# Patient Record
Sex: Male | Born: 1940 | Race: White | Hispanic: No | Marital: Married | Smoking: Former smoker
Health system: Southern US, Community
[De-identification: ages and names within clinical notes are randomized; demographics above are authoritative.]

## PROBLEM LIST (undated history)

## (undated) DIAGNOSIS — R531 Weakness: Secondary | ICD-10-CM

## (undated) DIAGNOSIS — H409 Unspecified glaucoma: Secondary | ICD-10-CM

## (undated) DIAGNOSIS — I10 Essential (primary) hypertension: Secondary | ICD-10-CM

## (undated) DIAGNOSIS — Z9981 Dependence on supplemental oxygen: Secondary | ICD-10-CM

## (undated) DIAGNOSIS — R2681 Unsteadiness on feet: Secondary | ICD-10-CM

## (undated) DIAGNOSIS — E079 Disorder of thyroid, unspecified: Secondary | ICD-10-CM

## (undated) DIAGNOSIS — E7800 Pure hypercholesterolemia, unspecified: Secondary | ICD-10-CM

## (undated) DIAGNOSIS — J449 Chronic obstructive pulmonary disease, unspecified: Secondary | ICD-10-CM

## (undated) DIAGNOSIS — I2510 Atherosclerotic heart disease of native coronary artery without angina pectoris: Secondary | ICD-10-CM

## (undated) HISTORY — DX: Weakness: R53.1

## (undated) HISTORY — DX: Unsteadiness on feet: R26.81

## (undated) HISTORY — PX: CATARACT EXTRACTION: SUR2

## (undated) HISTORY — DX: Essential (primary) hypertension: I10

## (undated) HISTORY — DX: Chronic obstructive pulmonary disease, unspecified: J44.9

## (undated) HISTORY — DX: Dependence on supplemental oxygen: Z99.81

---
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
## 1968-08-17 HISTORY — PX: VASECTOMY: SHX75

## 1975-08-18 HISTORY — PX: WISDOM TOOTH EXTRACTION: SHX21

## 1980-08-17 HISTORY — PX: BACK SURGERY: SHX140

## 2000-07-07 ENCOUNTER — Ambulatory Visit (HOSPITAL_COMMUNITY): Admission: RE | Admit: 2000-07-07 | Discharge: 2000-07-07 | Payer: Self-pay | Admitting: Endocrinology

## 2004-09-10 ENCOUNTER — Encounter: Admission: RE | Admit: 2004-09-10 | Discharge: 2004-09-10 | Payer: Self-pay | Admitting: Internal Medicine

## 2004-11-05 ENCOUNTER — Ambulatory Visit (HOSPITAL_COMMUNITY): Admission: RE | Admit: 2004-11-05 | Discharge: 2004-11-05 | Payer: Self-pay | Admitting: Gastroenterology

## 2005-02-24 ENCOUNTER — Encounter: Admission: RE | Admit: 2005-02-24 | Discharge: 2005-02-24 | Payer: Self-pay | Admitting: Otolaryngology

## 2006-06-18 ENCOUNTER — Encounter: Admission: RE | Admit: 2006-06-18 | Discharge: 2006-06-18 | Payer: Self-pay | Admitting: Interventional Cardiology

## 2006-06-22 ENCOUNTER — Inpatient Hospital Stay (HOSPITAL_BASED_OUTPATIENT_CLINIC_OR_DEPARTMENT_OTHER): Admission: RE | Admit: 2006-06-22 | Discharge: 2006-06-22 | Payer: Self-pay | Admitting: Interventional Cardiology

## 2006-06-24 ENCOUNTER — Inpatient Hospital Stay (HOSPITAL_COMMUNITY): Admission: RE | Admit: 2006-06-24 | Discharge: 2006-06-25 | Payer: Self-pay | Admitting: Interventional Cardiology

## 2006-07-06 ENCOUNTER — Encounter (HOSPITAL_COMMUNITY): Admission: RE | Admit: 2006-07-06 | Discharge: 2006-10-04 | Payer: Self-pay | Admitting: Interventional Cardiology

## 2006-10-05 ENCOUNTER — Encounter (HOSPITAL_COMMUNITY): Admission: RE | Admit: 2006-10-05 | Discharge: 2006-10-08 | Payer: Self-pay | Admitting: Interventional Cardiology

## 2007-07-20 ENCOUNTER — Inpatient Hospital Stay (HOSPITAL_BASED_OUTPATIENT_CLINIC_OR_DEPARTMENT_OTHER): Admission: RE | Admit: 2007-07-20 | Discharge: 2007-07-20 | Payer: Self-pay | Admitting: Interventional Cardiology

## 2007-07-28 ENCOUNTER — Encounter: Payer: Self-pay | Admitting: Pulmonary Disease

## 2007-07-28 ENCOUNTER — Ambulatory Visit: Admission: RE | Admit: 2007-07-28 | Discharge: 2007-07-28 | Payer: Self-pay | Admitting: Interventional Cardiology

## 2007-08-18 HISTORY — PX: CORONARY ANGIOPLASTY WITH STENT PLACEMENT: SHX49

## 2007-11-29 ENCOUNTER — Encounter: Admission: RE | Admit: 2007-11-29 | Discharge: 2007-11-29 | Payer: Self-pay | Admitting: Family Medicine

## 2009-08-02 ENCOUNTER — Ambulatory Visit: Payer: Self-pay | Admitting: Ophthalmology

## 2009-08-07 ENCOUNTER — Ambulatory Visit: Payer: Self-pay | Admitting: Ophthalmology

## 2009-10-07 ENCOUNTER — Encounter: Payer: Self-pay | Admitting: Emergency Medicine

## 2009-10-07 ENCOUNTER — Inpatient Hospital Stay (HOSPITAL_COMMUNITY): Admission: EM | Admit: 2009-10-07 | Discharge: 2009-10-09 | Payer: Self-pay | Admitting: Internal Medicine

## 2009-10-07 ENCOUNTER — Ambulatory Visit: Payer: Self-pay | Admitting: Diagnostic Radiology

## 2009-10-25 ENCOUNTER — Encounter: Payer: Self-pay | Admitting: Emergency Medicine

## 2009-10-25 ENCOUNTER — Ambulatory Visit: Payer: Self-pay | Admitting: Diagnostic Radiology

## 2009-10-25 ENCOUNTER — Inpatient Hospital Stay (HOSPITAL_COMMUNITY): Admission: AD | Admit: 2009-10-25 | Discharge: 2009-11-08 | Payer: Self-pay | Admitting: Pulmonary Disease

## 2009-10-25 ENCOUNTER — Ambulatory Visit: Payer: Self-pay | Admitting: Pulmonary Disease

## 2009-10-31 ENCOUNTER — Ambulatory Visit: Payer: Self-pay | Admitting: Physical Medicine & Rehabilitation

## 2009-11-27 DIAGNOSIS — J439 Emphysema, unspecified: Secondary | ICD-10-CM

## 2009-11-27 DIAGNOSIS — J9620 Acute and chronic respiratory failure, unspecified whether with hypoxia or hypercapnia: Secondary | ICD-10-CM | POA: Insufficient documentation

## 2009-12-27 ENCOUNTER — Ambulatory Visit: Payer: Self-pay | Admitting: Pulmonary Disease

## 2009-12-27 DIAGNOSIS — J438 Other emphysema: Secondary | ICD-10-CM | POA: Insufficient documentation

## 2009-12-27 DIAGNOSIS — I10 Essential (primary) hypertension: Secondary | ICD-10-CM

## 2009-12-27 DIAGNOSIS — J9610 Chronic respiratory failure, unspecified whether with hypoxia or hypercapnia: Secondary | ICD-10-CM

## 2010-01-24 ENCOUNTER — Ambulatory Visit: Payer: Self-pay | Admitting: Pulmonary Disease

## 2010-01-27 ENCOUNTER — Telehealth (INDEPENDENT_AMBULATORY_CARE_PROVIDER_SITE_OTHER): Payer: Self-pay | Admitting: *Deleted

## 2010-01-28 ENCOUNTER — Telehealth: Payer: Self-pay | Admitting: Pulmonary Disease

## 2010-03-19 ENCOUNTER — Encounter (HOSPITAL_COMMUNITY): Admission: RE | Admit: 2010-03-19 | Discharge: 2010-05-16 | Payer: Self-pay | Admitting: Pulmonary Disease

## 2010-04-24 ENCOUNTER — Ambulatory Visit: Payer: Self-pay | Admitting: Pulmonary Disease

## 2010-05-17 ENCOUNTER — Encounter (HOSPITAL_COMMUNITY): Admission: RE | Admit: 2010-05-17 | Discharge: 2010-07-06 | Payer: Self-pay | Admitting: Pulmonary Disease

## 2010-07-23 ENCOUNTER — Ambulatory Visit: Payer: Self-pay | Admitting: Pulmonary Disease

## 2010-09-16 NOTE — Assessment & Plan Note (Signed)
Summary: rov for emphysema   Copy to:  Brendan Herring Primary Provider/Referring Provider:  Dorthey Herring  CC:  3 month follow up, Pt states breathing is better from last visit, Pt states the SOB is better, Denies any cough, and Pt states he has had some hoarseness but he states it's due to not rinsing well after using inhaler.  History of Present Illness: the pt comes in today for f/u of his known emphysema with chronic respiratory failure.  He has been participating in pulmonary rehab and doing well.  He is staying on his bronchodilator regimen, and denies any cough or congestion.  He has not had any worsening edema.  Preventive Screening-Counseling & Management  Alcohol-Tobacco     Smoking Status: quit     Packs/Day: 1.0     Year Started: 1956     Year Quit: 1996  Current Medications (verified): 1)  Albuterol Sulfate (2.5 Mg/23ml) 0.083% Nebu (Albuterol Sulfate) .[name].. 1 Vial in Nebulizer Four Times A Day As Needed 2)  Aspir-Low 81 Mg Tbec (Aspirin) .... Once Daily 3)  Cardizem 120 Mg Tabs (Diltiazem Hcl) .... Take 1 Tablet By Mouth Once A Day 4)  Senokot S 8.6-50 Mg Tabs (Sennosides-Docusate Sodium) .... Take 1 Tablet By Mouth Once A Day As Needed 5)  Ensure  Liqd (Nutritional Supplements) .[name].. 1 Can Q6h 6)  Xalatan 0.005 % Soln (Latanoprost) .[name].. 1 Drop Into Affected Eye Once Daily 7)  Synthroid 112 Mcg Tabs (Levothyroxine Sodium) .... Once Daily 8)  Oxygen .... 2l Cont 9)  Fish Oil .... Take 1 Tablet By Mouth Once A Day 10)  Calcium Carbonate-Vitamin D 600-400 Mg-Unit Tabs (Calcium Carbonate-Vitamin D) .... Take 1 Tablet By Mouth Once A Day 11)  Proair Hfa 108 (90 Base) Mcg/act  Aers (Albuterol Sulfate) .[name].. 1-2 Puffs Every 4-6 Hours As Needed 12)  Multivitamins  Tabs (Multiple Vitamin) .... Take 1 Tablet By Mouth Once A Day 13)  Spiriva Handihaler 18 Mcg  Caps (Tiotropium Bromide Monohydrate) .... One Puff  in Handihaler Daily 14)  Symbicort 160-4.5 Mcg/act  Aero  (Budesonide-Formoterol Fumarate) .... Two Puffs Twice Daily 15)  Lisinopril 20 Mg Tabs (Lisinopril) .[name].. 1 Tab Two Times A Day 16)  Terinafine 250 Mg .[name].. 1 Tab For 7 Days Once A Month  Allergies (verified): 1)  Lotensin 2)  Levaquin  Social History: Packs/Day:  1.0  Review of Systems       The patient complains of shortness of breath with activity, chest pain, weight change, and nasal congestion/difficulty breathing through nose.  The patient denies shortness of breath at rest, productive cough, non-productive cough, coughing up blood, irregular heartbeats, acid heartburn, indigestion, loss of appetite, abdominal pain, difficulty swallowing, sore throat, tooth/dental problems, headaches, sneezing, itching, ear ache, anxiety, depression, hand/feet swelling, joint stiffness or pain, rash, change in color of mucus, and fever.    Vital Signs:  Patient profile:   70 year old male Height:      73 inches Weight:      133 pounds O2 Sat:      98 % on 2 L/min Temp:     98.3 degrees F oral Pulse rate:   92 / minute BP sitting:   130 / 84  (right arm) Cuff size:   regular  Vitals Entered By: Carver Fila (April 24, 2010 1:39 PM)  O2 Flow:  2 L/min CC: 3 month follow up, Pt states breathing is better from last visit, Pt states the SOB  is better, Denies any cough, Pt states he has had some hoarseness but he states it's due to not rinsing well after using inhaler Is Patient Diabetic? No Comments meds and allergies updated Phone number updated Carver Fila  April 24, 2010 1:38 PM    Physical Exam  General:  thin male in nad Lungs:  decreased bs throughout, no wheezing Heart:  rrr, no mrg. distant hs. Extremities:  no significant edema, no cyanosis  Neurologic:  alert and oriented, moves all 4.   Impression & Recommendations:  Problem # 1:  EMPHYSEMA (ICD-492.8) the pt is doing about as well can be expected.  He is maintaining his exertional tolerance, has had no recent acute  exacerbations or pulmonary infections, and is improving his endurance in pulmonary rehab.  I have asked him to continue on his current meds, and will give him the flu shot today.  Medications Added to Medication List This Visit: 1)  Synthroid 112 Mcg Tabs (Levothyroxine sodium) .... Once daily 2)  Lisinopril 20 Mg Tabs (Lisinopril) .[name].. 1 tab two times a day 3)  Terinafine 250 Mg  .[name].. 1 tab for 7 days once a month  Other Orders: Est. Patient Level III (16109) Flu Vaccine 44yrs + MEDICARE PATIENTS (U0454) Administration Flu vaccine - MCR (U9811)  Patient Instructions: 1)  no change in meds. 2)  continue with pulmonary rehab 3)  will give the flu shot today 4)  followup with me in 3mos or sooner if problems.   Immunization History:  Influenza Immunization History:    Influenza:  historical (05/17/2009)  Pneumovax Immunization History:    Pneumovax:  historical (05/17/2009) Flu Vaccine Consent Questions     Do you have a history of severe allergic reactions to this vaccine? no    Any prior history of allergic reactions to egg and/or gelatin? no    Do you have a sensitivity to the preservative Thimersol? no    Do you have a past history of Guillan-Barre Syndrome? no    Do you currently have an acute febrile illness? no    Have you ever had a severe reaction to latex? no    Vaccine information given and explained to patient? yes    Are you currently pregnant? no    Lot Number:[id number]   Exp Date:02/14/2011   Site Given  R Deltoid. Aundra Millet Reynolds LPN  April 24, 2010 2:12 PM

## 2010-09-16 NOTE — Assessment & Plan Note (Signed)
Summary: consult for emphysema and chronic respiratory failure.   Copy to:  [name] Primary Provider/Referring Provider:  [name]  CC:  Pulmonary Consult.  History of Present Illness: the patient is a 70 year old male who I've been asked to see for management of emphysema. The patient carries a diagnosis of "COPD", and tells me that he has had pulmonary function studies at [facility] in 2005. These are currently not available. He was recently hospitalized at in March of this year with acute on chronic respiratory failure due to a COPD exacerbation. He required noninvasive positive pressure ventilation, but did not need to be on mechanical ventilation. He did spend some time in a skilled nursing facility to help with nutrition and severe debilitation. Currently, he is not on long acting bronchodilators, but rather nebulizer treatments 4 times a day. He is also still on chronic prednisone. He is also wearing oxygen, but did not have this prior to his recent hospitalization. He states that his breathing is much improved since being out of the hospital. He still gets winded walking through his house, but feels the physical therapy has helped. The patient has had breathing issues for greater than 10 years and states it has been stable the last one year. Prior to his hospitalization he was very functional, and spent 30 minutes a day on the treadmill. Cough and congestion has never been a big issue for him since he has quit smoking.  Preventive Screening-Counseling & Management  Alcohol-Tobacco     Smoking Status: quit  Current Medications (verified): 1)  Albuterol Sulfate (2.5 Mg/53ml) 0.083% Nebu (Albuterol Sulfate) .[name].. 1 Vial in Nebulizer Four Times A Day As Needed 2)  Aspir-Low 81 Mg Tbec (Aspirin) .... Once Daily 3)  Cardizem 120 Mg Tabs (Diltiazem Hcl) .... Take 1 Tablet By Mouth Once A Day 4)  Senokot S 8.6-50 Mg Tabs (Sennosides-Docusate Sodium) .... Take 1 Tablet By  Mouth Once A Day As Needed 5)  Mucinex 600 Mg Xr12h-Tab (Guaifenesin) .... Two Times A Day 6)  Ensure  Liqd (Nutritional Supplements) .[name].. 1 Can Q6h 7)  Xalatan 0.005 % Soln (Latanoprost) .[name].. 1 Drop Into Affected Eye Once Daily 8)  Synthroid 125 Mcg Tabs (Levothyroxine Sodium) .... Once Daily 9)  Oxygen .... 2l Cont 10)  Metoprolol Succinate 25 Mg Xr24h-Tab (Metoprolol Succinate) .... Take 1 Tablet By Mouth Once A Day 11)  Mirtazapine 30 Mg Tabs (Mirtazapine) .... Take 1 Tablet By Mouth Once A Day 12)  Fish Oil .... Take 1 Tablet By Mouth Once A Day 13)  Calcium Carbonate-Vitamin D 600-400 Mg-Unit Tabs (Calcium Carbonate-Vitamin D) .... Take 1 Tablet By Mouth Once A Day 14)  Prednisone 10 Mg Tabs (Prednisone) .... Take 1 Tablet By Mouth Once A Day 15)  Proair Hfa 108 (90 Base) Mcg/act  Aers (Albuterol Sulfate) .[name].. 1-2 Puffs Every 4-6 Hours As Needed 16)  Multivitamins  Tabs (Multiple Vitamin) .... Take 1 Tablet By Mouth Once A Day  Allergies (verified): 1)  Lotensin 2)  Levaquin  Past History:  Past Medical History: emphysema HYPERTENSION (ICD-401.9)  Past Surgical History: back surgery 1982 stent 2006 B cataract surgery 1990s tonsillectomy 1944 wisdom teeth extracted 1960s vasectomy 1970s  Family History: Reviewed history and no changes required. emphysema: father heart disease: father (CHF), brother (CHF) clotting disorders: father (stroke) brother ( fibrosis)  Social History: Reviewed history and no changes required. Patient states former smoker.   started at age 39.  1 ppd.  quit 1996  pt is married. pt has children. 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  The patient denies shortness of breath at rest, productive cough, non-productive cough, coughing up blood, chest pain, irregular heartbeats, acid  heartburn, indigestion, loss of appetite, abdominal pain, difficulty swallowing, sore throat, tooth/dental problems, headaches, nasal congestion/difficulty breathing through nose, sneezing, itching, ear ache, anxiety, depression, joint stiffness or pain, rash, change in color of mucus, and fever.    Vital Signs:  Patient profile:   70 year old male Height:      73 inches Weight:      134 pounds BMI:     17.74 O2 Sat:      95 % on 2 LPM cont Temp:     98.1 degrees F oral Pulse rate:   92 / minute BP sitting:   146 / 82  (left arm) Cuff size:   regular  Vitals Entered By: Arman Filter LPN (Dec 27, 2009 11:06 AM)  O2 Flow:  2 LPM cont CC: Pulmonary Consult Comments Medications reviewed with patient Arman Filter LPN  Dec 27, 2009 11:32 AM    Physical Exam  General:  thin male in nad Eyes:  PERRLA and EOMI.   Nose:  mild septal deviation, but patent Mouth:  clear  Neck:  no jvd, tmg, LN Lungs:  very decreased bs throughout, no wheezing Heart:  distant but regular, no mrg Abdomen:  soft and nontender, bs+ Extremities:  2+ edema in feet, no cyanosis pulses intact distally Neurologic:  alert and oriented, moves all 4.   Impression & Recommendations:  Problem # 1:  EMPHYSEMA (ICD-492.8) the patient has fairly severe emphysema clinically, but we'll try and get his old PFTs for verification. At this point, I would like to get him on a long acting bronchodilator regimen which has been shown superior two short acting medications. I also think that he would greatly benefit from a pulmonary rehabilitation program once he is stronger. I have spent a lot of time with him discussing nutrition, and the importance of taking in large numbers of calories daily. His work of breathing is leading to muscle breakdown for calories, and this in turn leads to worsening dyspnea. Weight loss is considered to be a very worrisome finding in those with emphysema, and usually indicates the patient has  reached end-stage disease. Will also try and get the patient off chronic prednisone.  Medications Added to Medication List This Visit: 1)  Albuterol Sulfate (2.5 Mg/30ml) 0.083% Nebu (Albuterol sulfate) .[name].. 1 vial in nebulizer four times a day as needed 2)  Cardizem 120 Mg Tabs (Diltiazem hcl) .... Take 1 tablet by mouth once a day 3)  Senokot S 8.6-50 Mg Tabs (Sennosides-docusate sodium) .... Take 1 tablet by mouth once a day as needed 4)  Xalatan 0.005 % Soln (Latanoprost) .[name].. 1 drop into affected eye once daily 5)  Metoprolol Succinate 25 Mg Xr24h-tab (Metoprolol succinate) .... Take 1 tablet by mouth once a day 6)  Mirtazapine 30 Mg Tabs (Mirtazapine) .... Take 1 tablet by mouth once a day 7)  Fish Oil  .... Take 1 tablet by mouth once a day 8)  Calcium Carbonate-vitamin D 600-400 Mg-unit Tabs (Calcium carbonate-vitamin d) .... Take 1 tablet by mouth once a day 9)  Prednisone 10 Mg Tabs (Prednisone) .... Take 1 tablet by  mouth once a day 10)  Proair Hfa 108 (90 Base) Mcg/act Aers (Albuterol sulfate) .[name].. 1-2 puffs every 4-6 hours as needed 11)  Multivitamins Tabs (Multiple vitamin) .... Take 1 tablet by mouth once a day 12)  Spiriva Handihaler 18 Mcg Caps (Tiotropium bromide monohydrate) .... One puff  in handihaler daily 13)  Symbicort 160-4.5 Mcg/act Aero (Budesonide-formoterol fumarate) .... Two puffs twice daily  Other Orders: Consultation Level IV (04540) Prescription Created Electronically [phone])  Patient Instructions: 1)  restart symbicort 160/4.5  2 inhalations am and pm...rinse mouth well 2)  restart spiriva one inhalation each am 3)  can use albuterol nebs or inhaler for rescue only and "bad days" 4)  push calories, but ok with me to stop appetite stimulant 5)  decrease prednisone to 5mg  each day for 2 weeks, then stop 6)  will refer to pulmonary rehab at [facility] once you are a little stronger. 7)  let me know what type of portable oxygen you decide on, and will order for  you. 8)  use mucinex as needed for thick mucus. 9)  followup with me in 4 weeks.  Prescriptions: SYMBICORT 160-4.5 MCG/ACT  AERO (BUDESONIDE-FORMOTEROL FUMARATE) Two puffs twice daily  #1 x 6   Entered and Authorized by:   Barbaraann Share MD   Signed by:   Barbaraann Share MD on 12/27/2009   Method used:   Electronically to        [facility] [address] [id number]* (retail)       [address]       [place], [place]  [address]       Ph: [phone]       Fax: [phone]   RxID:   [id number] SPIRIVA HANDIHALER 18 MCG  CAPS (TIOTROPIUM BROMIDE MONOHYDRATE) one puff  in handihaler daily  #30 x 6   Entered and Authorized by:   Barbaraann Share MD   Signed by:   Barbaraann Share MD on 12/27/2009   Method used:   Electronically to        [facility] [address] #[id number]* (retail)       [address]       [place], [place]  [address]       Ph: [phone]       Fax: [phone]   RxID:   [id number]   Appended Document: consult for emphysema and chronic respiratory failure. megan, did we ever get pfts on this pt from [facility] from 2005?  Appended Document: consult for emphysema and chronic respiratory failure. spoke with Marcelino Duster at [facility] and was informed she looked through the 2005 and 2006 boxes of PFT and never saw a PFT on this pt.    Appended Document: consult for emphysema and chronic respiratory failure. noted.  these will have to be done.

## 2010-09-16 NOTE — Progress Notes (Signed)
Summary: proair prescript  Phone Note Call from Patient   Caller: Patient Call For: clance Summary of Call: need prescript for proair [facility] [address]   Initial call taken by: Rickard Patience,  January 27, 2010 2:38 PM  Follow-up for Phone Call        rx sent to [facility] [address].  called spoke with patient, informed him of pending rx.  pt verbalized his understanding. Follow-up by: Boone Master CNA/MA,  January 27, 2010 2:50 PM    Prescriptions: PROAIR HFA 108 (90 BASE) MCG/ACT  AERS (ALBUTEROL SULFATE) 1-2 puffs every 4-6 hours as needed  #30days x 6   Entered by:   Boone Master CNA/MA   Authorized by:   Barbaraann Share MD   Signed by:   Boone Master CNA/MA on 01/27/2010   Method used:   Electronically to        [facility] [address] [id number]* (retail)       [address]       [place], [place]  [address]       Ph: [phone]       Fax: [phone]   RxID:   [id number]

## 2010-09-16 NOTE — Progress Notes (Signed)
Summary: portable O2  Phone Note Call from Patient Call back at Home Phone [phone]   Caller: Patient Call For: Jovani Colquhoun Summary of Call: pt says [facility] won't provide portable O2 concentrator for him. pt uses [facility] for something else and wants to know if an order for the portable O2 can be sent to [facility] for this.  Initial call taken by: Tivis Ringer, CNA,  January 28, 2010 10:46 AM  Follow-up for Phone Call        Spoke with pt.  He states that he spoke with [facility] today and was told that since he only had one insurance policy, they can not provide him with portable o2.  He wants to know if we can just switch him back to [facility] for portable o2.  [facility] please help thanks Vernie Murders  January 28, 2010 10:56 AM On 01/24/10 pt saw Dr. Shelle Iron and wanted to change from [facility] to [facility] for his oxygen. Order sent to [facility] to D/C o2 and Order faxed to [facility] to provide o2. [facility] has p/u their equipment and I have left a message for the site manager at [facility] to return my call. I'm not sure if [facility] is willing to accept pt back, since he was not happy with their service. Rhonda Cobb  January 28, 2010 11:58 AM  Spoke with Okey Regal and she has already spoke to the patient. She stated that she is checking with her manager to see if they will make an expection and provide device for pt. She stated she will contact pt and advise. [facility] is willing to accept pt back. D/C order will be sent to [facility] once [facility] has their equipment back in there. Alfonso Ramus  January 28, 2010 12:43 PM Called and spoke with pt and he is aware and will call me once [facility]'s equipment is back in his home, so I can fax a D/C order to [facility]. Rhonda Cobb  January 28, 2010 12:44 PM

## 2010-09-16 NOTE — Assessment & Plan Note (Signed)
Summary: Brendan Herring for emphysema   Copy to:  Brendan Herring Primary Provider/Referring Provider:  Dorthey Herring  CC:  Pt is here for a 4 week f/u appt on his emphysema.  Pt states breathing is unchange from last visit. Pt states he finished prednisone.  Pt states he hasnn't needed to use nebulizer but has used rescue hfa " a few times" d/ the heat.  [name]  History of Present Illness: The pt comes in today for f/u of his severe emphysema with chronic respiratory failure.  We have been able to locate his old pfts, and verified he does indeed have severe emphysema.  The pt has not seen a big difference since last visit where he was changed over to a long acting bronchodilator regimen.  He has finished up his prednisone taper prescribed at discharge from the hospital when he had his acute exacerbation.  He denies any significant chest congestion, cough, or purulence.  Allergies (verified): 1)  Lotensin 2)  Levaquin  Review of Systems       The patient complains of shortness of breath with activity, nasal congestion/difficulty breathing through nose, and hand/feet swelling.  The patient denies shortness of breath at rest, productive cough, non-productive cough, coughing up blood, chest pain, irregular heartbeats, acid heartburn, indigestion, loss of appetite, weight change, abdominal pain, difficulty swallowing, sore throat, tooth/dental problems, headaches, sneezing, itching, ear ache, anxiety, depression, joint stiffness or pain, rash, change in color of mucus, and fever.    Vital Signs:  Patient profile:   70 year old male Height:      73 inches Weight:      133.31 pounds O2 Sat:      84 % on Room air Temp:     98.0 degrees F oral Pulse rate:   96 / minute BP sitting:   142 / 84  (left arm) Cuff size:   regular  Vitals Entered By: Arman Filter LPN (January 24, 2010 1:35 PM)  O2 Flow:  Room air CC: Pt is here for a 4 week f/u appt on his emphysema.  Pt states breathing is unchange from  last visit. Pt states he finished prednisone.  Pt states he hasnn't needed to use nebulizer but has used rescue hfa " a few times" d/ the heat.   Comments started pt on oxygen at 2 LPM.  o2 sats increased to 96% on 2 LPM.   Medications reviewed with patient Arman Filter LPN  January 24, 2010 1:35 PM    Physical Exam  General:  thin male in nad Lungs:  very decreased bs throughout, no wheezing or rhonchi Heart:  rrr, distant Extremities:  no edema or cyanosis Neurologic:  alert and oriented, moves all 4.   Impression & Recommendations:  Problem # 1:  EMPHYSEMA (ICD-492.8) the pt has severe emphysema with chronic respiratory failure.  He is on an excellent bronchodilator regimen, and now is oxygen dependent.  How much he is able to maintain his QOL is going to depend on his participation in a conditioning program, and also his ability to improve his nutritional status.  I will refer him to a pulmonary rehab program, and also stressed the need to increase protein calorie intake.    Problem # 2:  CHRONIC RESPIRATORY FAILURE (ICD-518.83) the pt wishes a more portable oxygen source, and has requested a portable concentrator.  Will refer to dme for this.  Other Orders: Pulse Oximetry, Ambulatory (21308) Rehabilitation Referral (Rehab) DME Referral (DME) Est. Patient Level III (  [address])  Patient Instructions: 1)  will get you referred to [facility] for the portable concentrator 2)  will refer to pulmonary rehab. 3)  no change in meds. 4)  followup with me in 3mos.

## 2010-09-18 NOTE — Assessment & Plan Note (Signed)
Summary: rov for severe emphysema   Visit Type:  Follow-up Copy to:  [name] Primary Provider/Referring Provider:  [name]  CC:  3 month follow up. pt states he has his "good" and "bad" days. pt denies any cough. Pt wants to be talked through on what he needs to do when he gets sick. pt states he quit smoking in 1996.  [name]  History of Present Illness: the pt comes in today for f/u of his known severe emphysema with chronic respiratory failure.  He is staying on his bronchodilator regimen and oxygen, and feels his exertional tolerance is near his usual baseline.  He denies any significant cough or congestion, and has not had a recent acute exacerbation.  Current Medications (verified): 1)  Albuterol Sulfate (2.5 Mg/3ml) 0.083% Nebu (Albuterol Sulfate) .[name].. 1 Vial in Nebulizer Four Times A Day As Needed 2)  Aspir-Low 81 Mg Tbec (Aspirin) .... Once Daily 3)  Cardizem 120 Mg Tabs (Diltiazem Hcl) .... Take 1 Tablet By Mouth Once A Day 4)  Senokot S 8.6-50 Mg Tabs (Sennosides-Docusate Sodium) .... Take 1 Tablet By Mouth Once A Day As Needed 5)  Ensure  Liqd (Nutritional Supplements) .[name].. 1 Can Q6h 6)  Xalatan 0.005 % Soln (Latanoprost) .[name].. 1 Drop Into Affected Eye Once Daily 7)  Synthroid 100 Mcg Tabs (Levothyroxine Sodium) .... Once Daily 8)  Oxygen .... 2l Cont 9)  Fish Oil 1000 Mg Caps (Omega-3 Fatty Acids) .... 2 Once Daily 10)  Calcium Carbonate-Vitamin D 600-400 Mg-Unit Tabs (Calcium Carbonate-Vitamin D) .... Take 1 Tablet By Mouth Once A Day 11)  Proair Hfa 108 (90 Base) Mcg/act  Aers (Albuterol Sulfate) .[name].. 1-2 Puffs Every 4-6 Hours As Needed 12)  Multivitamins  Tabs (Multiple Vitamin) .... Take 1 Tablet By Mouth Once A Day 13)  Spiriva Handihaler 18 Mcg  Caps (Tiotropium Bromide Monohydrate) .... One Puff  in Handihaler Daily 14)  Symbicort 160-4.5 Mcg/act  Aero (Budesonide-Formoterol Fumarate) .... Two Puffs Twice Daily 15)  Lisinopril 20 Mg Tabs (Lisinopril) .[name]..  1 Tab Two Times A Day 16)  Terinafine 250 Mg .[name].. 1 Tab For 7 Days Once A Month  Allergies (verified): 1)  Lotensin 2)  Levaquin  Review of Systems       The patient complains of shortness of breath with activity, weight change, and nasal congestion/difficulty breathing through nose.  The patient denies shortness of breath at rest, productive cough, non-productive cough, coughing up blood, chest pain, irregular heartbeats, acid heartburn, indigestion, loss of appetite, abdominal pain, difficulty swallowing, sore throat, tooth/dental problems, headaches, sneezing, itching, ear ache, anxiety, depression, hand/feet swelling, joint stiffness or pain, rash, change in color of mucus, and fever.    Vital Signs:  Patient profile:   70 year old male Height:      73 inches Weight:      139.50 pounds BMI:     18.47 O2 Sat:      96 % on 2 L/min Temp:     98.3 degrees F oral Pulse rate:   90 / minute BP sitting:   138 / 76  (left arm) Cuff size:   regular  Vitals Entered By: Carver Fila (July 23, 2010 1:22 PM)  O2 Flow:  2 L/min CC: 3 month follow up. pt states he has his "good" and "bad" days. pt denies any cough. Pt wants to be talked through on what he needs to do when he gets sick. pt states he quit smoking in  1996.   Comments meds and allergies updated Phone number updated Carver Fila  July 23, 2010 1:23 PM    Physical Exam  General:  thin male in nad Lungs:  very decreased bs throughout, no active wheezing noted. Heart:  rrr, no mrg Extremities:  minimal edema, no cyanosis  Neurologic:  alert and oriented, moves all 4.   Impression & Recommendations:  Problem # 1:  EMPHYSEMA (ICD-492.8) the pt appears to be at a stable baseline, although it is not a satisfactory one to him.  He appears to be doing as well as can be expected on his current bronchodilator regimen and oxygen.  I have had a discussion with him again about the "red flags" that may indicate when he is getting  into trouble vs the normal variation in symptoms based on airtrapping/airway tone on a given day.  I have continued to encourage him to stay as active as possible.  Medications Added to Medication List This Visit: 1)  Synthroid 100 Mcg Tabs (Levothyroxine sodium) .... Once daily 2)  Fish Oil 1000 Mg Caps (Omega-3 fatty acids) .... 2 once daily  Other Orders: Est. Patient Level III (62130)  Patient Instructions: 1)  no change in meds 2)  continue to stay as active as possible 3)  followup with me in 3mos.

## 2010-10-22 ENCOUNTER — Ambulatory Visit (INDEPENDENT_AMBULATORY_CARE_PROVIDER_SITE_OTHER): Payer: Medicare Other | Admitting: Pulmonary Disease

## 2010-10-22 ENCOUNTER — Encounter: Payer: Self-pay | Admitting: Pulmonary Disease

## 2010-10-22 DIAGNOSIS — J438 Other emphysema: Secondary | ICD-10-CM

## 2010-10-22 DIAGNOSIS — J9610 Chronic respiratory failure, unspecified whether with hypoxia or hypercapnia: Secondary | ICD-10-CM

## 2010-11-04 NOTE — Assessment & Plan Note (Signed)
Summary: rov for emphysema   Copy to:  [name] Primary Provider/Referring Provider:  [name]  CC:  3 month f/u appt for emphysema.  Pt states breathing is unchanged from last visit- still has "good days and bad days."   Pt states he currently has skin cancer on nose, ear, and and temple.  [name]  History of Present Illness: the pt comes in today for f/u of his known emphysema.  He is maintaining his usual baseline, and has not had a recent acute exacerbation.  He denies any chest congestion or purulence.  He is maintaining on his usual meds and supplemental oxygen.    Current Medications (verified): 1)  Albuterol Sulfate (2.5 Mg/57ml) 0.083% Nebu (Albuterol Sulfate) .[name].. 1 Vial in Nebulizer Four Times A Day As Needed 2)  Aspir-Low 81 Mg Tbec (Aspirin) .... Once Daily 3)  Cardizem 120 Mg Tabs (Diltiazem Hcl) .... Take 1 Tablet By Mouth Once A Day 4)  Senokot S 8.6-50 Mg Tabs (Sennosides-Docusate Sodium) .... Take 1 Tablet By Mouth Two Times A Day 5)  Ensure  Liqd (Nutritional Supplements) .[name].. 1 Can Q6h 6)  Xalatan 0.005 % Soln (Latanoprost) .[name].. 1 Drop in Each Eye Once Daily 7)  L Thyroxine .... Take 1 Tablet By Mouth Once A Day 8)  Oxygen .... 2l Cont 9)  Fish Oil 1000 Mg Caps (Omega-3 Fatty Acids) .... 2 Once Daily 10)  Calcium Carbonate-Vitamin D 600-400 Mg-Unit Tabs (Calcium Carbonate-Vitamin D) .... Take 1 Tablet By Mouth Two Times A Day 11)  Proair Hfa 108 (90 Base) Mcg/act  Aers (Albuterol Sulfate) .[name].. 1-2 Puffs Every 4-6 Hours As Needed 12)  Multivitamins  Tabs (Multiple Vitamin) .... Take 1 Tablet By Mouth Once A Day 13)  Spiriva Handihaler 18 Mcg  Caps (Tiotropium Bromide Monohydrate) .... One Puff  in Handihaler Daily 14)  Symbicort 160-4.5 Mcg/act  Aero (Budesonide-Formoterol Fumarate) .... Two Puffs Twice Daily 15)  Lisinopril 20 Mg Tabs (Lisinopril) .... Take 1 To 2 Tabs By Mouth Daily 16)  Terinafine 250 Mg .[name].. 1 Tab For 7 Days Once A Month 17)   Dorzolamide Hcl 2 % Soln (Dorzolamide Hcl) .[name].. 1 Drop in Each Eye 2 To 3 Times A Day 18)  Fluorouracil 5 % Crea (Fluorouracil) .... Apply As Directed 5 Nighs Per Week  Allergies (verified): 1)  Lotensin 2)  Levaquin  Review of Systems       The patient complains of shortness of breath with activity and nasal congestion/difficulty breathing through nose.  The patient denies shortness of breath at rest, productive cough, non-productive cough, coughing up blood, chest pain, irregular heartbeats, acid heartburn, indigestion, loss of appetite, weight change, abdominal pain, difficulty swallowing, sore throat, tooth/dental problems, headaches, sneezing, itching, ear ache, anxiety, depression, hand/feet swelling, joint stiffness or pain, rash, change in color of mucus, and fever.    Vital Signs:  Patient profile:   70 year old male Height:      73 inches Weight:      137.25 pounds BMI:     18.17 O2 Sat:      96 % on Room air Temp:     98.8 degrees F oral Pulse rate:   89 / minute BP sitting:   144 / 82  (left arm) Cuff size:   regular  Vitals Entered By: Arman Filter LPN (October 21, 1608 2:20 PM)  O2 Flow:  Room air CC: 3 month f/u appt for emphysema.  Pt states breathing  is unchanged from last visit- still has "good days and bad days."   Pt states he currently has skin cancer on nose, ear, and temple.   Comments Medications reviewed with patient Arman Filter LPN  October 22, 2839 2:20 PM    Physical Exam  General:  thin male in nad  Lungs:  decreased bs throughout, no wheezing Heart:  rrr, no mrg  Extremities:  no edema or cyanosis  Neurologic:  alert and oriented, moves all 4    Impression & Recommendations:  Problem # 1:  EMPHYSEMA (ICD-492.8) the pt has severe emphysema with chronic respiratory failure, and is maintaining at a stable baseline.  I have asked him to continue on his meds and oxygen, and to keep working on a conditioning program which is so key to maintaining  his functional status.  I have also asked him to keep up with his nutrition to maintain muscle mass.  Medications Added to Medication List This Visit: 1)  Senokot S 8.6-50 Mg Tabs (Sennosides-docusate sodium) .... Take 1 tablet by mouth two times a day 2)  Xalatan 0.005 % Soln (Latanoprost) .[name].. 1 drop in each eye once daily 3)  L Thyroxine  .... Take 1 tablet by mouth once a day 4)  Calcium Carbonate-vitamin D 600-400 Mg-unit Tabs (Calcium carbonate-vitamin d) .... Take 1 tablet by mouth two times a day 5)  Lisinopril 20 Mg Tabs (Lisinopril) .... Take 1 to 2 tabs by mouth daily 6)  Dorzolamide Hcl 2 % Soln (Dorzolamide hcl) .[name].. 1 drop in each eye 2 to 3 times a day 7)  Fluorouracil 5 % Crea (Fluorouracil) .... Apply as directed 5 nighs per week  Other Orders: Est. Patient Level III (32440)  Patient Instructions: 1)  no change in meds 2)  continue to stay active 3)  followup with me in 3mos

## 2010-11-05 LAB — COMPREHENSIVE METABOLIC PANEL
AST: 30 U/L (ref 0–37)
Albumin: 3 g/dL — ABNORMAL LOW (ref 3.5–5.2)
Alkaline Phosphatase: 47 U/L (ref 39–117)
Chloride: 94 mEq/L — ABNORMAL LOW (ref 96–112)
GFR calc Af Amer: >60 mL/min (ref >60)
Potassium: 3.3 mEq/L — ABNORMAL LOW (ref 3.5–5.1)
Total Bilirubin: 0.9 mg/dL (ref 0.3–1.2)
Total Protein: 5.9 g/dL — ABNORMAL LOW (ref 6.0–8.3)

## 2010-11-05 LAB — BASIC METABOLIC PANEL
BUN: 13 mg/dL (ref 6–23)
CO2: 30 mEq/L (ref 19–32)
GFR calc non Af Amer: >60 mL/min (ref >60)
Glucose, Bld: 227 mg/dL — ABNORMAL HIGH (ref 70–99)
Potassium: 3.7 mEq/L (ref 3.5–5.1)
Sodium: 135 mEq/L (ref 135–145)

## 2010-11-05 LAB — CBC
HCT: 38 % — ABNORMAL LOW (ref 39.0–52.0)
Hemoglobin: 13.3 g/dL (ref 13.0–17.0)
MCHC: 34.8 g/dL (ref 30.0–36.0)
MCV: 103.5 fL — ABNORMAL HIGH (ref 78.0–100.0)
Platelets: 157 10*3/uL (ref 150–400)
Platelets: 174 10*3/uL (ref 150–400)
RDW: 13.1 % (ref 11.5–15.5)
RDW: 13.1 % (ref 11.5–15.5)
WBC: 6.8 10*3/uL (ref 4.0–10.5)

## 2010-11-05 LAB — DIFFERENTIAL
Basophils Absolute: 0 10*3/uL (ref 0.0–0.1)
Basophils Absolute: 0 10*3/uL (ref 0.0–0.1)
Basophils Relative: 0 % (ref 0–1)
Basophils Relative: 0 % (ref 0–1)
Eosinophils Absolute: 0 10*3/uL (ref 0.0–0.7)
Eosinophils Relative: 0 % (ref 0–5)
Eosinophils Relative: 0 % (ref 0–5)
Lymphocytes Relative: 13 % (ref 12–46)
Lymphocytes Relative: 8 % — ABNORMAL LOW (ref 12–46)
Monocytes Absolute: 0.4 10*3/uL (ref 0.1–1.0)
Monocytes Absolute: 0.9 10*3/uL (ref 0.1–1.0)
Monocytes Relative: 13 % — ABNORMAL HIGH (ref 3–12)

## 2010-11-05 LAB — MAGNESIUM: Magnesium: 2 mg/dL (ref 1.5–2.5)

## 2010-11-07 LAB — CBC
HCT: 43.8 % (ref 39.0–52.0)
Platelets: 158 10*3/uL (ref 150–400)
RDW: 12.3 % (ref 11.5–15.5)
WBC: 7.4 10*3/uL (ref 4.0–10.5)

## 2010-11-07 LAB — POCT I-STAT 3, ART BLOOD GAS (G3+)
Bicarbonate: 28.7 mEq/L — ABNORMAL HIGH (ref 20.0–24.0)
Patient temperature: 37
pCO2 arterial: 40.8 mmHg (ref 35.0–45.0)
pH, Arterial: 7.455 — ABNORMAL HIGH (ref 7.350–7.450)

## 2010-11-07 LAB — BASIC METABOLIC PANEL
BUN: 14 mg/dL (ref 6–23)
Calcium: 8.5 mg/dL (ref 8.4–10.5)
Creatinine, Ser: 0.7 mg/dL (ref 0.4–1.5)
GFR calc non Af Amer: >60 mL/min (ref >60)
Glucose, Bld: 128 mg/dL — ABNORMAL HIGH (ref 70–99)
Potassium: 3.6 mEq/L (ref 3.5–5.1)

## 2010-11-07 LAB — DIFFERENTIAL
Basophils Absolute: 0 10*3/uL (ref 0.0–0.1)
Eosinophils Relative: 0 % (ref 0–5)
Lymphocytes Relative: 11 % — ABNORMAL LOW (ref 12–46)
Lymphs Abs: 0.8 10*3/uL (ref 0.7–4.0)
Neutrophils Relative %: 76 % (ref 43–77)

## 2010-11-09 LAB — CBC
HCT: 36.5 % — ABNORMAL LOW (ref 39.0–52.0)
HCT: 38.1 % — ABNORMAL LOW (ref 39.0–52.0)
HCT: 46.1 % (ref 39.0–52.0)
Hemoglobin: 12.3 g/dL — ABNORMAL LOW (ref 13.0–17.0)
MCV: 102.4 fL — ABNORMAL HIGH (ref 78.0–100.0)
MCV: 102.8 fL — ABNORMAL HIGH (ref 78.0–100.0)
MCV: 103.6 fL — ABNORMAL HIGH (ref 78.0–100.0)
Platelets: 182 10*3/uL (ref 150–400)
Platelets: 211 10*3/uL (ref 150–400)
Platelets: 224 10*3/uL (ref 150–400)
RBC: 3.58 MIL/uL — ABNORMAL LOW (ref 4.22–5.81)
RDW: 12.8 % (ref 11.5–15.5)
RDW: 13 % (ref 11.5–15.5)
RDW: 13 % (ref 11.5–15.5)
WBC: 11.2 10*3/uL — ABNORMAL HIGH (ref 4.0–10.5)
WBC: 12.1 10*3/uL — ABNORMAL HIGH (ref 4.0–10.5)
WBC: 3.8 10*3/uL — ABNORMAL LOW (ref 4.0–10.5)

## 2010-11-09 LAB — BASIC METABOLIC PANEL
BUN: 18 mg/dL (ref 6–23)
BUN: 19 mg/dL (ref 6–23)
BUN: 19 mg/dL (ref 6–23)
CO2: 37 mEq/L — ABNORMAL HIGH (ref 19–32)
CO2: 38 mEq/L — ABNORMAL HIGH (ref 19–32)
CO2: 40 mEq/L — ABNORMAL HIGH (ref 19–32)
Calcium: 8.1 mg/dL — ABNORMAL LOW (ref 8.4–10.5)
Calcium: 8.2 mg/dL — ABNORMAL LOW (ref 8.4–10.5)
Calcium: 8.4 mg/dL (ref 8.4–10.5)
Chloride: 100 mEq/L (ref 96–112)
Chloride: 91 mEq/L — ABNORMAL LOW (ref 96–112)
Chloride: 94 mEq/L — ABNORMAL LOW (ref 96–112)
Chloride: 97 mEq/L (ref 96–112)
Chloride: 97 mEq/L (ref 96–112)
Chloride: 98 mEq/L (ref 96–112)
Creatinine, Ser: 0.67 mg/dL (ref 0.4–1.5)
Creatinine, Ser: 0.7 mg/dL (ref 0.4–1.5)
Creatinine, Ser: 0.75 mg/dL (ref 0.4–1.5)
Creatinine, Ser: 0.77 mg/dL (ref 0.4–1.5)
Creatinine, Ser: 0.8 mg/dL (ref 0.4–1.5)
GFR calc Af Amer: >60 mL/min (ref >60)
GFR calc Af Amer: >60 mL/min (ref >60)
GFR calc Af Amer: >60 mL/min (ref >60)
GFR calc Af Amer: >60 mL/min (ref >60)
GFR calc non Af Amer: >60 mL/min (ref >60)
GFR calc non Af Amer: >60 mL/min (ref >60)
GFR calc non Af Amer: >60 mL/min (ref >60)
Glucose, Bld: 189 mg/dL — ABNORMAL HIGH (ref 70–99)
Glucose, Bld: 192 mg/dL — ABNORMAL HIGH (ref 70–99)
Potassium: 3.6 mEq/L (ref 3.5–5.1)
Potassium: 3.6 mEq/L (ref 3.5–5.1)
Potassium: 3.7 mEq/L (ref 3.5–5.1)
Sodium: 139 mEq/L (ref 135–145)
Sodium: 141 mEq/L (ref 135–145)

## 2010-11-09 LAB — GLUCOSE, CAPILLARY
Glucose-Capillary: 102 mg/dL — ABNORMAL HIGH (ref 70–99)
Glucose-Capillary: 102 mg/dL — ABNORMAL HIGH (ref 70–99)
Glucose-Capillary: 108 mg/dL — ABNORMAL HIGH (ref 70–99)
Glucose-Capillary: 110 mg/dL — ABNORMAL HIGH (ref 70–99)
Glucose-Capillary: 122 mg/dL — ABNORMAL HIGH (ref 70–99)
Glucose-Capillary: 123 mg/dL — ABNORMAL HIGH (ref 70–99)
Glucose-Capillary: 132 mg/dL — ABNORMAL HIGH (ref 70–99)
Glucose-Capillary: 147 mg/dL — ABNORMAL HIGH (ref 70–99)
Glucose-Capillary: 147 mg/dL — ABNORMAL HIGH (ref 70–99)
Glucose-Capillary: 152 mg/dL — ABNORMAL HIGH (ref 70–99)
Glucose-Capillary: 152 mg/dL — ABNORMAL HIGH (ref 70–99)
Glucose-Capillary: 155 mg/dL — ABNORMAL HIGH (ref 70–99)
Glucose-Capillary: 162 mg/dL — ABNORMAL HIGH (ref 70–99)
Glucose-Capillary: 163 mg/dL — ABNORMAL HIGH (ref 70–99)
Glucose-Capillary: 166 mg/dL — ABNORMAL HIGH (ref 70–99)
Glucose-Capillary: 178 mg/dL — ABNORMAL HIGH (ref 70–99)
Glucose-Capillary: 180 mg/dL — ABNORMAL HIGH (ref 70–99)
Glucose-Capillary: 188 mg/dL — ABNORMAL HIGH (ref 70–99)
Glucose-Capillary: 193 mg/dL — ABNORMAL HIGH (ref 70–99)
Glucose-Capillary: 200 mg/dL — ABNORMAL HIGH (ref 70–99)
Glucose-Capillary: 209 mg/dL — ABNORMAL HIGH (ref 70–99)
Glucose-Capillary: 218 mg/dL — ABNORMAL HIGH (ref 70–99)
Glucose-Capillary: 231 mg/dL — ABNORMAL HIGH (ref 70–99)
Glucose-Capillary: 241 mg/dL — ABNORMAL HIGH (ref 70–99)
Glucose-Capillary: 248 mg/dL — ABNORMAL HIGH (ref 70–99)
Glucose-Capillary: 255 mg/dL — ABNORMAL HIGH (ref 70–99)
Glucose-Capillary: 277 mg/dL — ABNORMAL HIGH (ref 70–99)
Glucose-Capillary: 282 mg/dL — ABNORMAL HIGH (ref 70–99)
Glucose-Capillary: 291 mg/dL — ABNORMAL HIGH (ref 70–99)
Glucose-Capillary: 302 mg/dL — ABNORMAL HIGH (ref 70–99)
Glucose-Capillary: 311 mg/dL — ABNORMAL HIGH (ref 70–99)
Glucose-Capillary: 347 mg/dL — ABNORMAL HIGH (ref 70–99)
Glucose-Capillary: 361 mg/dL — ABNORMAL HIGH (ref 70–99)
Glucose-Capillary: 382 mg/dL — ABNORMAL HIGH (ref 70–99)
Glucose-Capillary: 61 mg/dL — ABNORMAL LOW (ref 70–99)
Glucose-Capillary: 91 mg/dL (ref 70–99)
Glucose-Capillary: 92 mg/dL (ref 70–99)

## 2010-11-09 LAB — CARDIAC PANEL(CRET KIN+CKTOT+MB+TROPI)
CK, MB: 5.2 ng/mL — ABNORMAL HIGH (ref 0.3–4.0)
Relative Index: INVALID (ref 0.0–2.5)
Relative Index: INVALID (ref 0.0–2.5)
Relative Index: INVALID (ref 0.0–2.5)
Total CK: 34 U/L (ref 7–232)
Total CK: 50 U/L (ref 7–232)
Troponin I: <0.01 ng/mL (ref 0.00–0.06)

## 2010-11-09 LAB — MRSA PCR SCREENING
MRSA by PCR: NEGATIVE
MRSA by PCR: NEGATIVE
MRSA by PCR: NEGATIVE

## 2010-11-09 LAB — URINE CULTURE
Colony Count: 55000
Colony Count: NO GROWTH
Special Requests: NEGATIVE

## 2010-11-09 LAB — CULTURE, BLOOD (ROUTINE X 2)
Culture: NO GROWTH
Culture: NO GROWTH

## 2010-11-09 LAB — URINALYSIS, ROUTINE W REFLEX MICROSCOPIC
Bilirubin Urine: NEGATIVE
Glucose, UA: 250 mg/dL — AB
Hgb urine dipstick: NEGATIVE
Specific Gravity, Urine: 1.02 (ref 1.005–1.030)
Urobilinogen, UA: 0.2 mg/dL (ref 0.0–1.0)

## 2010-11-09 LAB — MAGNESIUM
Magnesium: 1.8 mg/dL (ref 1.5–2.5)
Magnesium: 2.3 mg/dL (ref 1.5–2.5)

## 2010-11-09 LAB — PROTIME-INR
INR: 0.96 (ref 0.00–1.49)
Prothrombin Time: 12.7 seconds (ref 11.6–15.2)

## 2010-11-09 LAB — POCT I-STAT 3, ART BLOOD GAS (G3+)
Bicarbonate: 32.6 mEq/L — ABNORMAL HIGH (ref 20.0–24.0)
pCO2 arterial: 43.2 mmHg (ref 35.0–45.0)
pCO2 arterial: 52 mmHg — ABNORMAL HIGH (ref 35.0–45.0)
pO2, Arterial: 154 mmHg — ABNORMAL HIGH (ref 80.0–100.0)
pO2, Arterial: 75 mmHg — ABNORMAL LOW (ref 80.0–100.0)

## 2010-11-09 LAB — BLOOD GAS, ARTERIAL
Bicarbonate: 28.8 mEq/L — ABNORMAL HIGH (ref 20.0–24.0)
FIO2: 0.3 %
O2 Saturation: 96.7 %
Patient temperature: 98.4
TCO2: 30.4 mmol/L (ref 0–100)

## 2010-11-09 LAB — PHOSPHORUS
Phosphorus: 2.5 mg/dL (ref 2.3–4.6)
Phosphorus: 2.6 mg/dL (ref 2.3–4.6)
Phosphorus: 2.6 mg/dL (ref 2.3–4.6)

## 2010-11-09 LAB — LACTIC ACID, PLASMA: Lactic Acid, Venous: 1.1 mmol/L (ref 0.5–2.2)

## 2010-11-09 LAB — LEGIONELLA ANTIGEN, URINE

## 2010-11-09 LAB — APTT: aPTT: 30 seconds (ref 24–37)

## 2010-11-09 LAB — HEPATIC FUNCTION PANEL
ALT: 31 U/L (ref 0–53)
Bilirubin, Direct: <0.1 mg/dL (ref 0.0–0.3)
Total Protein: 4.9 g/dL — ABNORMAL LOW (ref 6.0–8.3)

## 2010-11-09 LAB — T4: T4, Total: 10.5 ug/dL (ref 5.0–12.5)

## 2010-11-09 LAB — TSH: TSH: 1.822 u[IU]/mL (ref 0.350–4.500)

## 2010-11-09 LAB — BRAIN NATRIURETIC PEPTIDE: Pro B Natriuretic peptide (BNP): 35 pg/mL (ref 0.0–100.0)

## 2010-11-10 LAB — POCT I-STAT 3, ART BLOOD GAS (G3+)
pCO2 arterial: 63.6 mmHg (ref 35.0–45.0)
pH, Arterial: 7.327 — ABNORMAL LOW (ref 7.350–7.450)

## 2010-11-10 LAB — URINALYSIS, ROUTINE W REFLEX MICROSCOPIC
Hgb urine dipstick: NEGATIVE
Nitrite: NEGATIVE
Specific Gravity, Urine: 1.019 (ref 1.005–1.030)
Urobilinogen, UA: 0.2 mg/dL (ref 0.0–1.0)

## 2010-11-10 LAB — BASIC METABOLIC PANEL
CO2: 36 mEq/L — ABNORMAL HIGH (ref 19–32)
Calcium: 8.6 mg/dL (ref 8.4–10.5)
Creatinine, Ser: 0.8 mg/dL (ref 0.4–1.5)
GFR calc Af Amer: >60 mL/min (ref >60)

## 2010-11-10 LAB — DIFFERENTIAL
Basophils Absolute: 0 10*3/uL (ref 0.0–0.1)
Basophils Relative: 1 % (ref 0–1)
Monocytes Relative: 14 % — ABNORMAL HIGH (ref 3–12)
Neutro Abs: 6.1 10*3/uL (ref 1.7–7.7)
Neutrophils Relative %: 79 % — ABNORMAL HIGH (ref 43–77)

## 2010-11-10 LAB — CBC
MCHC: 34.7 g/dL (ref 30.0–36.0)
RBC: 4.69 MIL/uL (ref 4.22–5.81)

## 2010-11-10 LAB — POCT CARDIAC MARKERS: Myoglobin, poc: 43.4 ng/mL (ref 12–200)

## 2010-12-30 NOTE — Cardiovascular Report (Signed)
NAME:  Brendan Herring, Brendan Herring                ACCOUNT NO.:  [id number]   MEDICAL RECORD NO.:  [id number]          PATIENT TYPE:  OIB   LOCATION:  1965                         FACILITY:  [facility]   PHYSICIAN:  [name], MDDATE OF BIRTH:  24-Jul-1941   DATE OF PROCEDURE:  07/20/2007  DATE OF DISCHARGE:  07/20/2007                            CARDIAC CATHETERIZATION   REFERRING PHYSICIAN:  C. Duane Lope, M.D.   REASON FOR PROCEDURE:  Coronary artery disease and shortness of breath.   PROCEDURES PERFORMED:  1. Left heart catheterization.  2. Left ventriculogram.  3. Coronary angiogram.  4. Abdominal aortogram.   OPERATOR:  [name], MD   PROCEDURE NARRATIVE:  The risks and benefits of cardiac catheterization  were explained to the patient, and informed consent was obtained. The  patient was brought to the catheterization lab. He was prepped and  draped in the usual sterile fashion.  His right groin was infiltrated  with 1% lidocaine.  A 4-French arterial sheath was placed into the right  femoral artery using the modified Seldinger technique.  Left coronary  artery angiography was performed using a JL-4.5 catheter.  The catheter  was advanced to the ostium of the left circumflex.  The LAD and  circumflex had separate ostia and was then used to engage the LAD.  Angiography was performed in multiple projections using hand injection  of contrast.  Right coronary artery angiography was then performed using  a 3DRC catheter.  The catheter was advanced to the vessel ostium under  fluoroscopic guidance.  Digital angiography was performed in multiple  projections using hand injection of contrast.  A pigtail catheter was  advanced into the left ventricle under fluoroscopic guidance.  Power  injection of contrast was performed in the RAO projection. The catheter  was pulled back under continuous hemodynamic pressure monitoring. Power  injection of contrast was performed in the abdominal  aorta to image at  the level of the renal arteries.  The sheath was removed using manual  compression.   FINDINGS:  The LAD and circumflex had separate ostia.   The circumflex was a large vessel proximally.  There were mild luminal  irregularities.  There was a patent OM-1, a small OM-2. The mid  circumflex appeared occluded just before the OM-3.  There were left-to-  left collaterals filling the distal OM-3.   The left anterior descending was a large vessel.  There were mild  luminal irregularities proximally.  The mid LAD stent was widely patent.  There was a small diagonal-1 which was patent.  There was a small  diagonal-2 at the site of the stent which was also widely patent.   The right coronary artery was a large dominant vessel.  The vessel was  widely patent.  There were mild luminal irregularities throughout.   The abdominal aorta did show a small infrarenal abdominal aortic  aneurysm. There was no significant renal artery stenosis.   HEMODYNAMIC RESULTS:  Left ventricular pressure 97/2 and an LVEDP of 5  mmHg.  Aortic pressure of 91/52 with a mean aortic pressure of 70 mmHg.  Left ventriculogram reveals normal left ventricular function with an  estimated ejection fraction of 55%.   IMPRESSION:  1. Widely patent left anterior descending stent at diagonal site where      intervention was performed last year.  2. 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  3. Small abdominal aortic aneurysm.  4. Normal hemodynamics  5. Separate ostia of left circumflex and left anterior descending.  6. Normal left ventricular function.   RECOMMENDATIONS:  1. The patient will have pulmonary function test to further evaluate      shortness of breath.  If no abnormalities are found, I would      consider trying to open his chronically occluded OM; however, I am      not sure this would make a big difference.  I think it is more       likely that he has another cause of the shortness of breath.  2. Continue aggressive medical therapy including aspirin, Plavix,      lipid-lowering therapy.      [name], MD  Electronically Signed     JSV/MEDQ  D:  08/22/2007  T:  08/22/2007  Job:  [id number]

## 2011-01-02 NOTE — Discharge Summary (Signed)
NAME:  Brendan Herring, Brendan Herring NO.:  [id number]   MEDICAL RECORD NO.:  [id number]          PATIENT TYPE:  INP   LOCATION:  6533                         FACILITY:  [facility]   PHYSICIAN:  [name], MDDATE OF BIRTH:  03-06-1941   DATE OF ADMISSION:  06/24/2006  DATE OF DISCHARGE:  06/25/2006                               DISCHARGE SUMMARY   ADMISSION DIAGNOSIS:  Shortness of breath and an abnormal stress test.   DISCHARGE DIAGNOSES:  1. Shortness of breath, resolved.  2. Abnormal Cardiolite status post cardiac catheterization with 80%      mid left anterior descending artery stenosis at the origin of the      second diagonal  3. Status post percutaneous coronary intervention with successful      Taxus stent implantation to the mid left anterior descending artery      and percutaneous transluminal coronary angioplasty to the diagonal      2 with eventual simultaneous kissing balloons in the mid left      anterior descending  artery, June 24, 2006.  4. Chronic obstructive pulmonary disease.  5. Emphysema.  6. Hypertension.  7. Hypothyroidism.  8. Osteopenia.   PROCEDURES:  Percutaneous coronary intervention of the left anterior  descending artery and second diagonal June 24, 2006.   HOSPITAL COURSE:  Mr. Astorga is a 70 year old male who was admitted  secondary to cardiac catheterization on June 22, 2006 with 80% mid  LAD stenosis at the origin of the second diagonal.  He was admitted on  June 24, 2006 for PCI of the mid LAD and diagonal 2.  A Taxus stent  was successfully placed in the mid LAD and PTCA to the diagonal 2 and  the mid LAD.  There was an excellent angiographic result with TIMI grade  III flow.  There was 0% residual stenosis in the LAD artery and a 10%  residual stenosis in the ostium of the second diagonal.  The procedure  was well tolerated by the patient with minimal estimated blood loss.  The patient will be continued on aspirin 325  mg daily and Plavix 75 mg  daily, indefinitely.  He was kept overnight for observation and was  hydrated aggressively post procedure.  Lipid-lowering therapy will be  continued with Pravachol.  The patient is already on an ACE inhibitor  which will also be continued.  The sheath was removed with manual  compression, and Angiomax was used for anticoagulation.  The patient  remained asymptomatic during this admission.  His groin was stable  without evidence of bleeding, oozing, or hematoma.  He was discharged to  home in stable condition on June 25, 2006 after ambulating in the  hallways with cardiac rehab without symptoms of chest pain, shortness of  breath, nausea, vomiting, diaphoresis, or dizziness.   LABORATORY DATA:  White blood count 7.4, hemoglobin 13.2, hematocrit  37.8, platelets 211.  Sodium 139, potassium 3.8, chloride 106, CO2 29,  glucose 96, BUN 13, creatinine 0.8, calcium 8.4.   X-RAYS:  Outpatient x-rays were obtained.  However, none were obtained  during this admission.  EKG:  EKG June 24, 2006:  Normal sinus rhythm with a ventricular rate  of 66 beats per minute with nonspecific ST/T-wave abnormalities.  No  evidence of ischemia.   CONDITION ON DISCHARGE:  Mr. Copeman was discharged to home today in  stable condition without symptoms of chest pain or shortness of breath.  His groin was stable without oozing, bleeding, hematoma, or  pseudoaneurysm.   DISCHARGE MEDICATIONS:  1. Aspirin 325 mg daily.  This represents an increase in the dosage of      the aspirin.  2. Plavix 75 mg daily.  3. Zestoretic 20/12.5 mg daily.  4. Covera ER 240 mg daily to be restarted on Sunday, November 11,      20 07.  5. Synthroid 125 mcg daily.  6. Spiriva.  7. Multivitamin 1 tablet daily.  8. Calcium as before admission.  9. Pravachol 20 mg daily.   DISCHARGE INSTRUCTIONS:  1. Maintain a heart-healthy diet including low-salt, low-fat, and low      cholesterol.  2. No  lifting greater than 10 pounds for 1 week.  3. No driving for 2 days.  4. Bathe groin area with warm soap and water.  Do not scrub area.  5. Call physician office with recurrent chest pain or shortness of      breath.   FOLLOW-UP ARRANGEMENTS:  A follow-up appointment with Dr. Everette Rank  has been scheduled for July 07, 2006 at 1:45 p.m.  The patient is  scheduled to call [phone] if he is unable to make the scheduled  appointment.      [address] [place], [place]      [name], MD  Electronically Signed    RDM/MEDQ  D:  06/25/2006  T:  06/26/2006  Job:  [id number]   cc:   Melida Quitter, M.D.  [name], MD

## 2011-01-02 NOTE — H&P (Signed)
NAME:  Brendan Herring, Brendan Herring NO.:  [id number]   MEDICAL RECORD NO.:  [id number]          PATIENT TYPE:  OIB   LOCATION:  1965                         FACILITY:  [facility]   PHYSICIAN:  [name], MDDATE OF BIRTH:  1941/05/15   DATE OF ADMISSION:  06/22/2006  DATE OF DISCHARGE:  06/22/2006                              HISTORY & PHYSICAL   REASON FOR TODAY'S EVALUATION:  Worsening shortness of breath, left  shoulder discomfort, positive Cardiolite.   HISTORY OF PRESENT ILLNESS:  Brendan Herring is a 70 year old male patient  with a history of COPD.  He does have some long-term dyspnea, but states  that over the past 6 months he feels like his shortness of breath on  exertion has worsened.  He also had developed a left shoulder discomfort  that is associated with the shortness of breath.  He was seen in the  office on May 18, 2006 and ultimately a Cardiolite was performed  that showed a moderate-sized fixed defect in the inferolateral  territory.  There was also reversibility in the inferior and lateral  areas of the heart surrounding this fixed defect.  The EF was 65%.  The  patient's stress test was a treadmill stress test.  He was unable to  complete the test because he could not get to his target heart rate.  He  did have ST segment depression in the inferior leads.   Because of these findings, he was brought in today for cardiac  catheterization.   PAST MEDICAL HISTORY:  1. Emphysema.  2. Hypertension.  3. Hypothyroidism.  4. Osteopenia.   PAST SURGICAL HISTORY:  1. Tonsillectomy with tooth extraction, 1967.  2. Lower back surgery, 1982.  3. Detached retina, 2007.  4. Cataract surgery, 1995 and 2000.   ALLERGIES:  LEVAQUIN AND LOTENSIN.   CURRENT MEDICATIONS:  1. Lisinopril/hydrochlorothiazide 50/25 mg 1 tablet daily.  2. Diltiazem CD 240 mg a day.  3. Baby aspirin daily.  4. Synthroid 125 mcg daily.  5. Spiriva inhaler.  6. Calcium 1200 mg a  day.   SOCIAL HISTORY:  Former smokers with 35-pack-year history.  Occasional  alcohol use.  No illicit drug use.  He is retired.   FAMILY HISTORY:  No family history of early coronary artery disease.   REVIEW OF SYSTEMS:  No recent fevers, chills, or flu-like symptoms.  Denies any nausea, vomiting, or diarrhea.  All other symptoms are as  above or are negative.   PHYSICAL EXAMINATION:  VITAL SIGNS:  Blood pressure 130/80, pulse 80.  HEENT:  Grossly normal.  Sclerae clear, conjunctivae normal.  NECK:  No carotid or supraclavicular bruits.  No JVD or thyromegaly.  HEART:  Regular rate and rhythm.  Normal S1, S2.  LUNGS:  Clear to auscultation bilaterally.  No wheezes or rhonchi.  ABDOMEN:  Benign.  Nontender, nondistended.  EXTREMITIES:  No peripheral edema.  Palpable posterior tibialis pulses.  NEUROLOGIC:  Normal gait.  SKIN:  Warm and dry.  PSYCH:  Normal mood and affect.   ASSESSMENT/PLAN:  1. Dyspnea on exertion.  2. Left shoulder discomfort associated  with #1.  3. Abnormal Cardiolite.  4. Chronic obstructive pulmonary disease.  5. Former smoker.  6. Hypertension.  7. Hypothyroidism.  8. Osteopenia.  9. Long-term medication usage.   The patient is being brought in today for cardiac catheterization under  the care of Dr. Everette Rank.  The risks and benefits of the procedure  have been discussed with the patient including heart attack, stroke, or  even death.  The patient agrees to proceed.      Guy Franco, P.A.      [name], MD  Electronically Signed    LB/MEDQ  D:  06/22/2006  T:  06/22/2006  Job:  [id number]   cc:   Haig Prophet

## 2011-01-02 NOTE — Cardiovascular Report (Signed)
NAME:  Brendan Herring, Brendan Herring NO.:  [id number]   MEDICAL RECORD NO.:  [id number]          PATIENT TYPE:  OIB   LOCATION:  1965                         FACILITY:  [facility]   PHYSICIAN:  [name], MDDATE OF BIRTH:  [birth year]   DATE OF PROCEDURE:  06/22/2006  DATE OF DISCHARGE:                            CARDIAC CATHETERIZATION   REFERRING PHYSICIAN:  Duane Lope, M.D. [facility].   INDICATIONS FOR CATHETERIZATION:  Abnormal stress test and exertional  shortness of breath.   OPERATOR:  [name], MD.   PROCEDURE NARRATIVE:  The risks and benefits of cardiac catheterization  were explained to the patient, and informed consent was obtained. The  patient was brought to the catheterization lab and prepped, draped in  the usual sterile fashion.  Right groin was infiltrated 1% lidocaine.  A  4-French arterial sheath was placed into the right femoral artery using  modified Seldinger technique. Left coronary artery angiography was  performed using a JL-4.0 catheter. The catheter was selected and the  circumflex artery.  This catheter was then removed, and a JL-3.5  catheter was used to engage the ostium of the LAD. Digital angiography  was performed in multiple projections using hand injection of contrast.  Right coronary artery angiography was then performed using JR-4.0  catheter.  The catheter was advanced to the vessel ostium under  fluoroscopic guidance.  Digital angiography was performed in multiple  projections using hand injection of contrast. The ventriculogram was  then performed. A pigtail catheter was advanced to the aortic valve and  into the left ventricle.  The 30-degree RAO projection was used for  power injection of contrast. The catheter was pulled back under  continuous hemodynamic pressure monitoring. The catheter was withdrawn  to the level of the renal arteries.  Power injection of contrast was  performed in the AP  projection to visualize the infrarenal abdominal  aorta.  The sheath was pulled using manual compression.   FINDINGS:  There are separate ostia of the LAD and circumflex. There is  no left main.   The LAD has a 70% lesion in the mid vessel at the second diagonal. There  is a small first diagonal which appears free of disease.  There is a  medium-size vessel.   The circumflex is a medium-sized vessel and is patent proximally.  There  is a large OM-1 which has luminal irregularities. Just after the OM-2,  the circumflex is occluded. The OM-3 fills by left-to-left and right-to-  left collaterals.   The right coronary artery is a large dominant vessel with mild luminal  irregularities.   HEMODYNAMIC RESULTS:  Left ventricular pressure 122/6, LVEDP of 10 mmHg.  Aortic pressure 118/62 with a mean aortic pressure of 85 mmHg.  There is  also a small abdominal aortic aneurysm.  There are single renal arteries  perfusing each kidney.  There is an ostial 25% lesion in the right renal  artery. The left renal artery is widely patent   IMPRESSION:  1. Two-vessel coronary artery disease including the mid circumflex and  obtuse marginal #3 as well as the mid left anterior descending at      the origin of the second diagonal.  2. Overall normal ventricular function with an estimated ejection      fraction of 55%.  There is mild inferobasal hypokinesis.   PLAN:  1. We discussed various options and will proceed with PCI of the LAD      on Friday of this week.  We will give the patient 300 mg of Plavix      prior to the intervention.  2. Continue aspirin as well. The patient will also benefit from lipid-      lowering therapy and continued use of ACE inhibitor.      [name], MD  Electronically Signed     JSV/MEDQ  D:  06/22/2006  T:  06/22/2006  Job:  [id number]

## 2011-01-02 NOTE — Op Note (Signed)
NAME:  Brendan Herring, Brendan Herring NO.:  [id number]   MEDICAL RECORD NO.:  [id number]          PATIENT TYPE:  AMB   LOCATION:  ENDO                         FACILITY:  [facility]   PHYSICIAN:  Danise Edge, M.D.   DATE OF BIRTH:  [birth year]   DATE OF PROCEDURE:  11/05/2004  DATE OF DISCHARGE:                                 OPERATIVE REPORT   PROCEDURE:  Screening colonoscopy.   PROCEDURE INDICATIONS:  Brendan Herring is a 70 year old male born Dec 24, 1940.  Brendan Herring is scheduled to undergo his first screening colonoscopy  with polypectomy to prevent colon cancer.   ENDOSCOPIST:  Danise Edge, M.D.   PREMEDICATION:  1.  Versed 7 mg.  2.  Demerol 50 mg.   PROCEDURE:  After obtaining informed consent, Brendan Herring was placed in the  left lateral decubitus position.  I administered intravenous Demerol and  intravenous Versed to achieve conscious sedation for the procedure.  The  patient's blood pressure, oxygen saturation, and cardiac rhythm were  monitored throughout the procedure and documented in the medical record.   Anal inspection and digital rectal exam were normal.  The prostate was non-  nodular. Olympus adjustable pediatric colonoscope was introduced into the  rectum and advanced to the cecum.  Colonic preparation for the exam today  was excellent.   Rectum normal.   Sigmoid colon and descending colon.  Colonic diverticulosis.   Splenic flexure normal.   Transverse colon normal.   Hepatic flexure normal.   Ascending colon normal.   Cecum and ileocecal valve normal.   ASSESSMENT:  Normal screening proctocolonoscopy to the cecum.  Diverticulosis of the left colon noted.      MJ/MEDQ  D:  11/05/2004  T:  11/05/2004  Job:  [id number]   cc:   Sharlet Salina, M.D.  [address]  [place]  [place] [address]  Fax: [phone]

## 2011-01-02 NOTE — Cardiovascular Report (Signed)
NAME:  Brendan Herring, Brendan Herring NO.:  [id number]   MEDICAL RECORD NO.:  [id number]          PATIENT TYPE:  INP   LOCATION:  6533                         FACILITY:  [facility]   PHYSICIAN:  [name], MDDATE OF BIRTH:  1941-01-09   DATE OF PROCEDURE:  06/24/2006  DATE OF DISCHARGE:                            CARDIAC CATHETERIZATION   REFERRING PHYSICIAN:  Dr. Leonides Sake.   PROCEDURE PERFORMED:  PCI of the LAD and second diagonal.   OPERATOR:  Dr. Eldridge Dace.   INDICATIONS:  Shortness of breath and abnormal stress test.   FINDINGS:  There is an 80% mid-LAD stenosis at the origin of the second  diagonal.  There was great difficulty engaging the ostium of the LAD  because there was no left main and the circumflex and LAD had separate  ostia.  Initially a XB3 guiding catheter was tried days.  A Voda 3  guide, JL-3.5 and JL-3.0 guiding catheters were all attempted.  Finally  an XB/LAD guiding catheter was successful in engaging the ostium of the  LAD.  A BMW wire first was inserted into the second diagonal.  A  Prowater was placed down the LAD.  A 2.5 x 16 mm Taxus stent was then  deployed across the lesion in the mid-LAD.  It was deployed at 12  atmospheres for 43 seconds.  There was a 90% residual stenosis noted in  the ostium of the second diagonal with normal flow.  A whisper wire was  then placed down the second diagonal.  A 2.0 x 10 FireStar balloon was  inflated to 10 atmospheres for 40 seconds.  A 2.0 x 10 mm FireStar  balloon was then advanced into the LAD stent.  Simultaneous kissing  balloons were then performed with both balloons being inflated to 8  atmospheres for 30 seconds.  There was an excellent angiographic result  with TIMI III flow down both vessels.   IMPRESSION:  1. Successful Taxus stent placement to the mid-left anterior      descending artery with a 2.5 x 16 mm Taxus stent.  The diameter of      these stent after deployment was probably  about 2.75 mm.  2. Percutaneous transluminal coronary angioplasty to the second      diagonal with a 2.0 x 10 FireStar balloon.  Simultaneous kissing      balloons were then performed in the left anterior descending artery      and second diagonal with an excellent angiographic result.  There      was TIMI III flow.  There was a 0% residual stenosis in the left      anterior descending artery and a 10% residual stenosis in the      ostium of the second diagonal.   RECOMMENDATIONS:  The patient can continue on aspirin 325 mg daily and  Plavix 75 mg daily indefinitely.  The patient will be hydrated  aggressively postprocedure.  Should also continue secondary prevention  including lipid  lowering therapy.  Will consider beta blockade; however, the patient  does have COPD but also consider an ACE  inhibitor which he is already  on.  I will watch the patient overnight.  The sheath will be removed  using manual compression.  Angiomax was used for anticoagulation.      [name], MD  Electronically Signed     JSV/MEDQ  D:  06/24/2006  T:  06/25/2006  Job:  [id number]

## 2011-01-05 ENCOUNTER — Other Ambulatory Visit: Payer: Self-pay | Admitting: Pulmonary Disease

## 2011-01-07 ENCOUNTER — Telehealth: Payer: Self-pay | Admitting: Pulmonary Disease

## 2011-01-07 NOTE — Telephone Encounter (Signed)
Rx sent to pharmacy. Pt informed.  

## 2011-01-09 ENCOUNTER — Encounter: Payer: Self-pay | Admitting: Pulmonary Disease

## 2011-01-21 ENCOUNTER — Ambulatory Visit (INDEPENDENT_AMBULATORY_CARE_PROVIDER_SITE_OTHER): Payer: Medicare Other | Admitting: Pulmonary Disease

## 2011-01-21 ENCOUNTER — Encounter: Payer: Self-pay | Admitting: Pulmonary Disease

## 2011-01-21 VITALS — BP 154/80 | HR 85 | Temp 97.8°F | Ht 73.0 in | Wt 136.2 lb

## 2011-01-21 DIAGNOSIS — J449 Chronic obstructive pulmonary disease, unspecified: Secondary | ICD-10-CM

## 2011-01-21 NOTE — Progress Notes (Signed)
  Subjective:    Patient ID: Brendan Herring, male    DOB: 07-30-1941, 70 y.o.   MRN: [id number]  HPI The pt comes in today for f/u of his known severe emphysema and chronic RF.  He is staying on his BD regimen, and wearing oxygen compliantly.  He is staying active, and feels that his doe is at his usual baseline.  He denies any significant cough or mucus production.  He has not had worsening LE edema.    Review of Systems  Constitutional: Negative for fever and unexpected weight change.  HENT: Positive for nosebleeds and congestion. Negative for ear pain, sore throat, rhinorrhea, sneezing, trouble swallowing, dental problem, postnasal drip and sinus pressure.   Eyes: Positive for redness and itching.  Respiratory: Positive for shortness of breath. Negative for cough, chest tightness and wheezing.   Cardiovascular: Negative for palpitations and leg swelling.  Gastrointestinal: Negative for nausea and vomiting.  Genitourinary: Positive for dysuria.  Musculoskeletal: Negative for joint swelling.  Skin: Positive for rash.  Neurological: Negative for headaches.  Hematological: Bruises/bleeds easily.  Psychiatric/Behavioral: Negative for dysphoric mood. The patient is not nervous/anxious.        Objective:   Physical Exam Thin male in nad Nares without discharge or purulence Chest with very decreased bs throughout, no wheezing or rhonchi Cor with distant hs, regular LE without edema, no cyanosis  Alert, oriented, moves all 4        Assessment & Plan:

## 2011-01-21 NOTE — Patient Instructions (Signed)
No change in meds. Stay as active as possible Will send an order to [facility] for your extended battery for portable concentrator Push calories as much as possible followup with me in 3mos

## 2011-01-21 NOTE — Assessment & Plan Note (Signed)
The pt has severe emphysema, and is really doing better than expected.  He has not had an acute exacerbation or pulmonary infection since the last visit, and is trying to stay active.  He is to stay on his oxygen and BD regimen, and to followup with me in 3mos.

## 2011-01-28 ENCOUNTER — Telehealth: Payer: Self-pay | Admitting: Pulmonary Disease

## 2011-01-28 MED ORDER — BUDESONIDE-FORMOTEROL FUMARATE 160-4.5 MCG/ACT IN AERO: 2.0000 | INHALATION_SPRAY | Freq: Two times a day (BID) | RESPIRATORY_TRACT | Status: DC

## 2011-01-28 NOTE — Telephone Encounter (Signed)
Called and spoke with pt.  Informed him rx sent to pharmacy.

## 2011-04-08 ENCOUNTER — Other Ambulatory Visit: Payer: Self-pay | Admitting: Pulmonary Disease

## 2011-04-28 ENCOUNTER — Ambulatory Visit (INDEPENDENT_AMBULATORY_CARE_PROVIDER_SITE_OTHER): Payer: Medicare Other | Admitting: Pulmonary Disease

## 2011-04-28 ENCOUNTER — Encounter: Payer: Self-pay | Admitting: Pulmonary Disease

## 2011-04-28 VITALS — BP 158/82 | HR 88 | Temp 98.1°F | Ht 73.0 in | Wt 134.6 lb

## 2011-04-28 DIAGNOSIS — J449 Chronic obstructive pulmonary disease, unspecified: Secondary | ICD-10-CM

## 2011-04-28 NOTE — Assessment & Plan Note (Signed)
The patient continues to do about as well as expected given the severity of his lung disease.  He has had no recent exacerbation or chest infection.  He is trying to stay as active as possible, and is wearing his oxygen the most part 24 hours a day.  I have offered him the flu shot today, however he prefers to get this with his primary care physician.  He will followup with me in 3-4 months, or sooner if having issues.

## 2011-04-28 NOTE — Patient Instructions (Signed)
No change in meds. Stay as active as you can Make sure you get your flu shot with primary md at upcoming visit.  followup with me in 3-80mos.

## 2011-04-28 NOTE — Progress Notes (Signed)
  Subjective:    Patient ID: Brendan Herring, male    DOB: 30-Jul-1941, 70 y.o.   MRN: [id number]  HPI Patient comes in today for followup of his known severe COPD with chronic respiratory failure.  He is doing well on his current medical regimen, and has not had a recent exacerbation her pulmonary infection.  He is staying very active, and feels that his breathing is at his usual baseline.  He did have a lot of issues this summer with the high heat and humidity, but is much improved with the cooler weather.   Review of Systems  Constitutional: Negative for fever and unexpected weight change.  HENT: Positive for ear pain, nosebleeds, congestion and sneezing. Negative for sore throat, rhinorrhea, trouble swallowing, dental problem, postnasal drip and sinus pressure.   Eyes: Negative for redness and itching.  Respiratory: Positive for shortness of breath. Negative for cough, chest tightness and wheezing.   Cardiovascular: Negative for palpitations and leg swelling.  Gastrointestinal: Negative for nausea and vomiting.  Genitourinary: Negative for dysuria.  Musculoskeletal: Negative for joint swelling.  Skin: Negative for rash.  Neurological: Negative for headaches.  Hematological: Does not bruise/bleed easily.  Psychiatric/Behavioral: Negative for dysphoric mood. The patient is not nervous/anxious.        Objective:   Physical Exam Thin male in no acute distress Nose without purulence or discharge noted Chest with very decreased breath sounds throughout, no wheezes or rhonchi Cardiac exam distant heart sounds but regular Lower extremities without edema, no cyanosis noted Alert and oriented, moves all 4 extremities.       Assessment & Plan:

## 2011-07-28 IMAGING — CR DG CHEST 1V PORT
2 series · 2 of 2 positions shown · non-contrast
Comparison: 10/25/2009.

CLINICAL DATA: Emphysema.  Hypertension.  Short of breath.

PORTABLE CHEST - 1 VIEW

[view not recorded (1 of 2)]
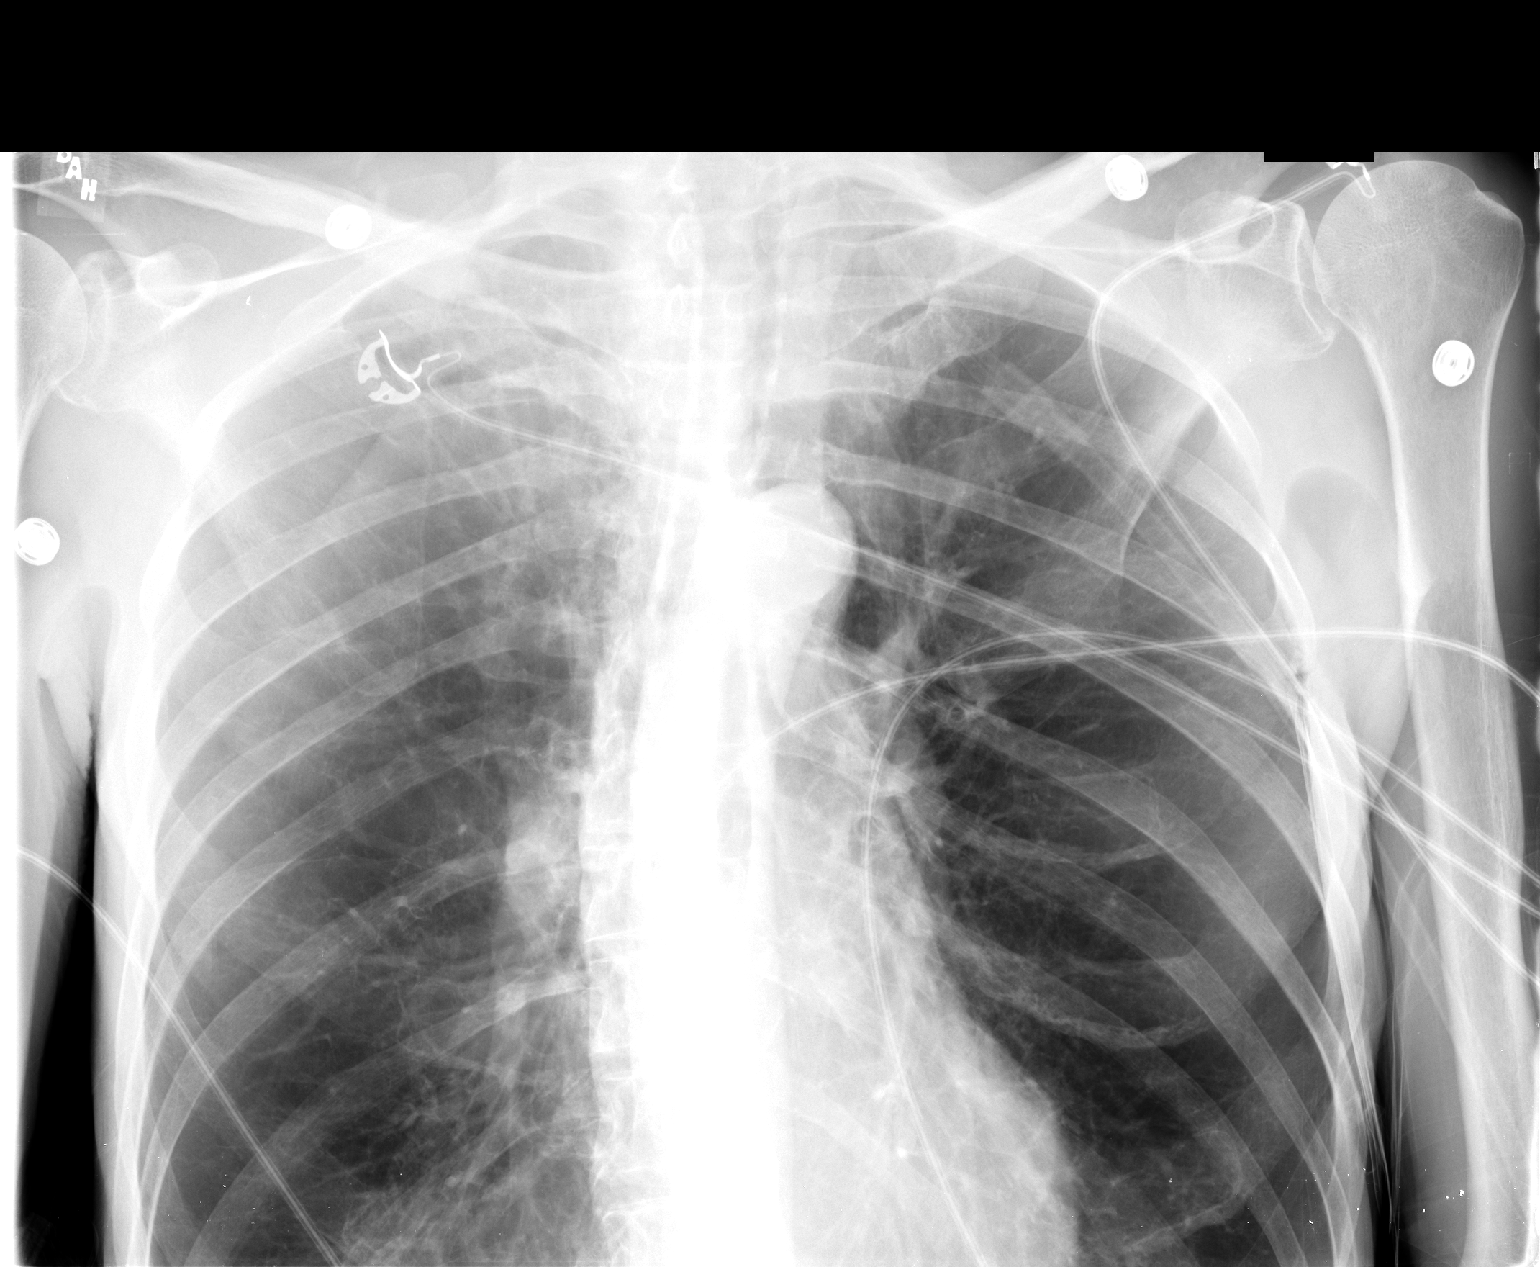

[view not recorded (2 of 2)]
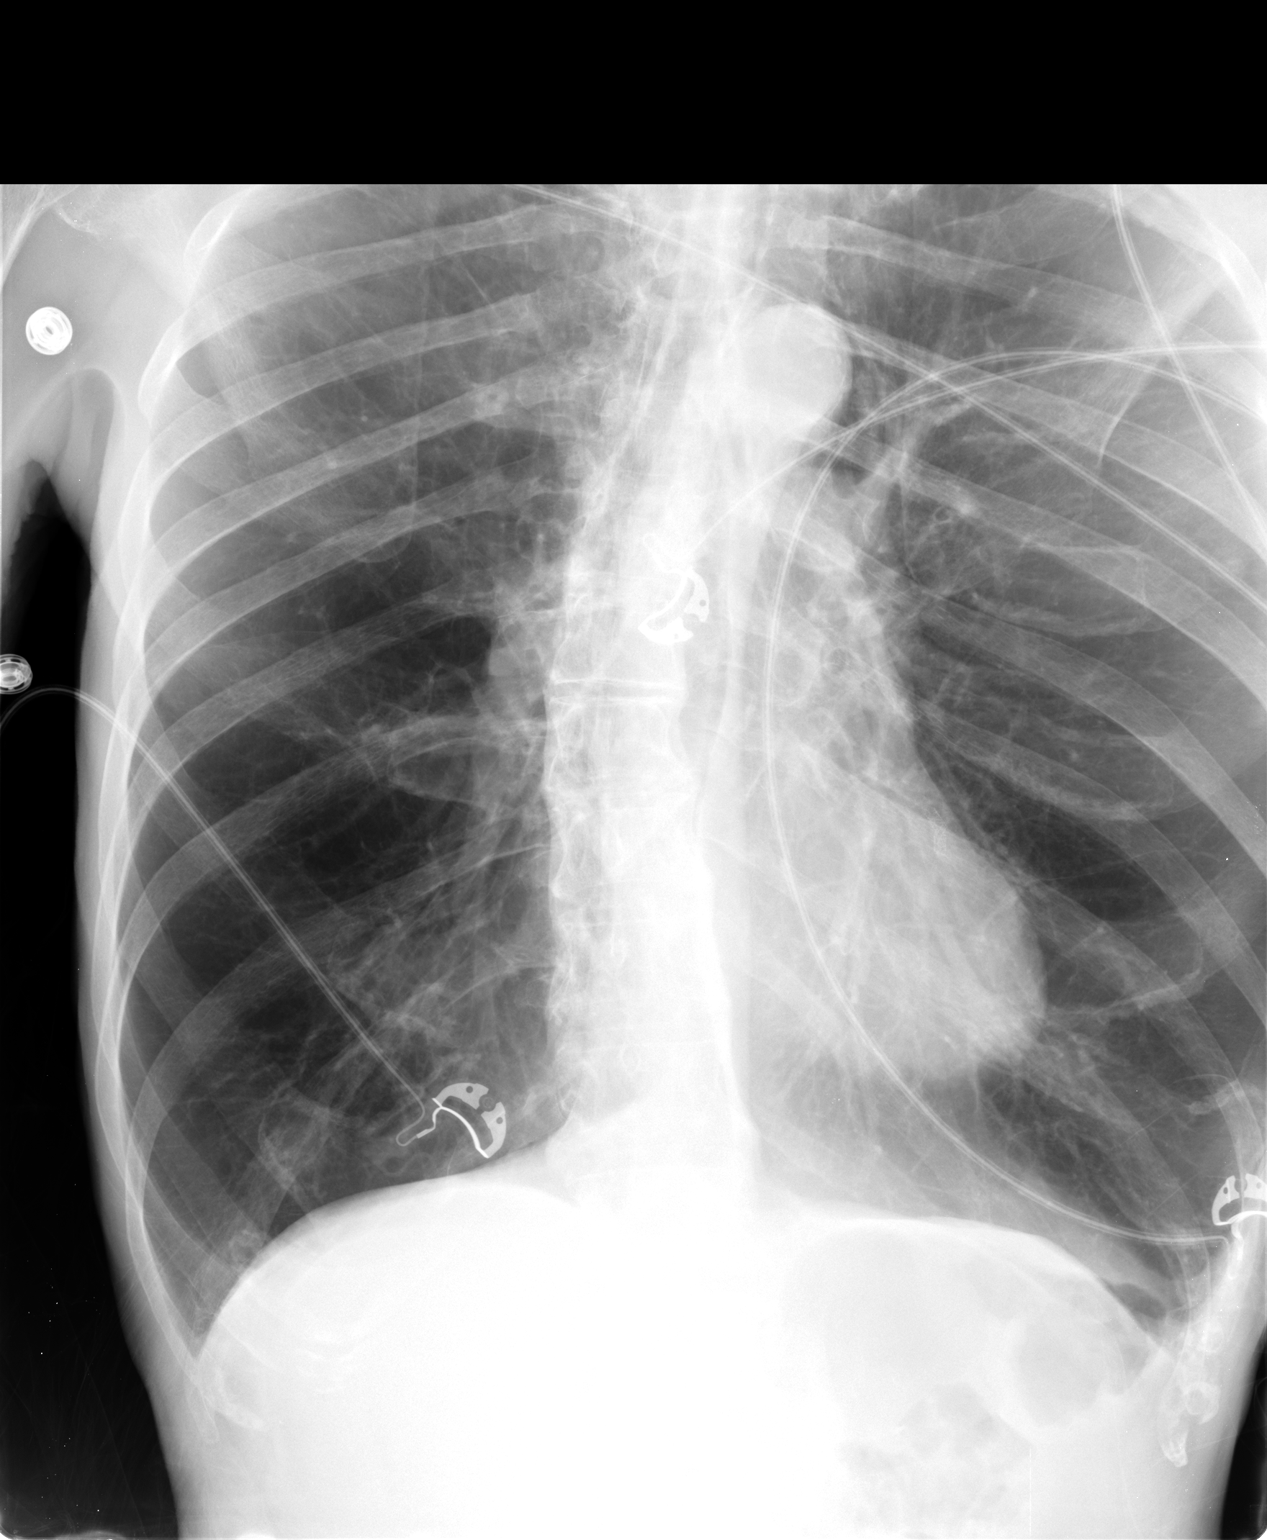

[2 of 2 positions shown; findings below may reference images not displayed]

FINDINGS: Lungs are diffusely hyperaerated but clear.  No vascular
congestion.  Normal cardiomediastinal silhouette.  Mild
dextroscoliosis.
IMPRESSION: COPD.  No interval change.  No acute findings.

## 2011-08-25 ENCOUNTER — Encounter: Payer: Self-pay | Admitting: Pulmonary Disease

## 2011-08-25 ENCOUNTER — Ambulatory Visit (INDEPENDENT_AMBULATORY_CARE_PROVIDER_SITE_OTHER): Payer: Medicare Other | Admitting: Pulmonary Disease

## 2011-08-25 VITALS — BP 162/94 | HR 98 | Temp 98.5°F | Ht 73.0 in | Wt 131.8 lb

## 2011-08-25 DIAGNOSIS — J449 Chronic obstructive pulmonary disease, unspecified: Secondary | ICD-10-CM

## 2011-08-25 NOTE — Assessment & Plan Note (Signed)
The patient appears to be near his usual baseline from a pulmonary standpoint.  He is on an excellent bronchodilator regimen, is keeping up with his vaccine regimen, and exercises fairly regularly.  My only concern is some mild weight loss since the last visit, and I have stressed to him the importance of maintaining his weight.  I have asked him to try and increase his caloric intake, especially protein calories.

## 2011-08-25 NOTE — Patient Instructions (Signed)
No change in breathing meds Will look at checking a cxr next visit. You must increase caloric intake, especially protein to build muscle mass.  Try "muscle milk" with whole milk once or twice a day.  Don't take in excessive quantities of carbohydrates.  Do not worry about low fat diet at this point. followup with me in 3-4 mos.

## 2011-08-25 NOTE — Progress Notes (Signed)
  Subjective:    Patient ID: Brendan Herring, male    DOB: 08/22/1940, 71 y.o.   MRN: [id number]  HPI Patient comes in today for followup of his known severe emphysema with chronic respiratory failure.  His exertional tolerance is very similar from the last visit, and he has had no recent acute exacerbation or pulmonary infection.  He has lost some weight since last visit, primarily from taking care of his sick wife who is now in a rehabilitation center.   Review of Systems  Constitutional: Negative for fever and unexpected weight change.  HENT: Positive for nosebleeds, congestion and sinus pressure. Negative for ear pain, sore throat, rhinorrhea, sneezing, trouble swallowing, dental problem and postnasal drip.   Eyes: Negative for redness and itching.  Respiratory: Positive for cough, chest tightness and shortness of breath. Negative for wheezing.   Cardiovascular: Negative for palpitations and leg swelling.  Gastrointestinal: Negative for nausea and vomiting.  Genitourinary: Negative for dysuria.  Musculoskeletal: Negative for joint swelling.  Skin: Negative for rash.  Neurological: Negative for headaches.  Hematological: Bruises/bleeds easily.  Psychiatric/Behavioral: Negative for dysphoric mood. The patient is not nervous/anxious.        Objective:   Physical Exam Thin male in no acute distress Nose without purulence or discharge noted Chest with decreased breath sounds, no wheezes or rhonchi Cardiac with distant heart sounds, but regular Lower extremities without edema, no cyanosis noted Alert and oriented, moves all 4 extremities.       Assessment & Plan:

## 2011-12-30 ENCOUNTER — Encounter: Payer: Self-pay | Admitting: Pulmonary Disease

## 2011-12-30 ENCOUNTER — Ambulatory Visit (INDEPENDENT_AMBULATORY_CARE_PROVIDER_SITE_OTHER): Payer: Medicare Other | Admitting: Pulmonary Disease

## 2011-12-30 VITALS — BP 116/80 | HR 93 | Temp 98.4°F | Ht 73.0 in | Wt 132.2 lb

## 2011-12-30 DIAGNOSIS — J449 Chronic obstructive pulmonary disease, unspecified: Secondary | ICD-10-CM

## 2011-12-30 DIAGNOSIS — J9610 Chronic respiratory failure, unspecified whether with hypoxia or hypercapnia: Secondary | ICD-10-CM

## 2011-12-30 DIAGNOSIS — J4489 Other specified chronic obstructive pulmonary disease: Secondary | ICD-10-CM

## 2011-12-30 NOTE — Assessment & Plan Note (Signed)
The patient has known severe emphysema, but is maintaining a reasonable baseline given the severity of his disease.  There is nothing by his history or exam to suggest an acute exacerbation pulmonary infection.  I have encouraged him to push calories, to stay on his conditioning program, and to let me know if he has any worsening of his symptoms.  If he continues to have issues over the next few weeks, would consider treating him with a short course of prednisone.

## 2011-12-30 NOTE — Progress Notes (Signed)
  Subjective:    Patient ID: Brendan Herring, male    DOB: 06-06-1941, 71 y.o.   MRN: [id number]  HPI Patient comes in today for followup of his known severe emphysema with chronic respiratory failure.  He has not had any recent acute exacerbation, for the last 4-8 weeks he has been at the lower end of his usual baseline.  He blames this on a lack of exercise/getting off his usual conditioning program since his wife has been ill.  He also has had issues with the pollen and postnasal drip with mild cough.  He has been compliant with his bronchodilator regimen.   Review of Systems  Constitutional: Negative.  Negative for fever and unexpected weight change.  HENT: Positive for congestion. Negative for ear pain, nosebleeds, sore throat, rhinorrhea, sneezing, trouble swallowing, dental problem, postnasal drip and sinus pressure.   Eyes: Negative.  Negative for redness and itching.  Respiratory: Positive for cough and shortness of breath. Negative for chest tightness and wheezing.   Cardiovascular: Negative.  Negative for palpitations and leg swelling.  Gastrointestinal: Negative.  Negative for nausea and vomiting.  Genitourinary: Negative.  Negative for dysuria.  Musculoskeletal: Negative.  Negative for joint swelling.  Skin: Negative.  Negative for rash.  Neurological: Negative.  Negative for headaches.  Hematological: Negative.  Does not bruise/bleed easily.  Psychiatric/Behavioral: Negative.  Negative for dysphoric mood. The patient is not nervous/anxious.        Objective:   Physical Exam Thin male in no acute distress. Nose without purulence or discharge noted Chest with decreased breath sounds throughout, no wheezing Cardiac exam with distant heart sounds, but sounds regular. Lower extremities without edema, no cyanosis Alert and oriented, moves all 4 extremities.       Assessment & Plan:

## 2011-12-30 NOTE — Patient Instructions (Signed)
No change in medications Try and get back to your exercise program. If you find that things are not improving over the next few weeks, please call and we can consider a short prednisone burst.  followup with me in 4mos.

## 2012-01-15 ENCOUNTER — Other Ambulatory Visit: Payer: Self-pay | Admitting: Pulmonary Disease

## 2012-04-12 ENCOUNTER — Other Ambulatory Visit: Payer: Self-pay | Admitting: Pulmonary Disease

## 2012-05-03 ENCOUNTER — Telehealth: Payer: Self-pay | Admitting: Pulmonary Disease

## 2012-05-03 ENCOUNTER — Encounter: Payer: Self-pay | Admitting: Pulmonary Disease

## 2012-05-03 ENCOUNTER — Ambulatory Visit (INDEPENDENT_AMBULATORY_CARE_PROVIDER_SITE_OTHER): Payer: Medicare Other | Admitting: Pulmonary Disease

## 2012-05-03 VITALS — BP 140/82 | HR 80 | Temp 98.5°F | Ht 73.0 in | Wt 131.2 lb

## 2012-05-03 DIAGNOSIS — J4489 Other specified chronic obstructive pulmonary disease: Secondary | ICD-10-CM

## 2012-05-03 DIAGNOSIS — J9610 Chronic respiratory failure, unspecified whether with hypoxia or hypercapnia: Secondary | ICD-10-CM

## 2012-05-03 DIAGNOSIS — Z23 Encounter for immunization: Secondary | ICD-10-CM

## 2012-05-03 DIAGNOSIS — J449 Chronic obstructive pulmonary disease, unspecified: Secondary | ICD-10-CM

## 2012-05-03 MED ORDER — ALBUTEROL SULFATE HFA 108 (90 BASE) MCG/ACT IN AERS: 2.0000 | INHALATION_SPRAY | RESPIRATORY_TRACT | Status: DC | PRN

## 2012-05-03 NOTE — Progress Notes (Signed)
  Subjective:    Patient ID: Brendan Herring, male    DOB: Jan 15, 1941, 71 y.o.   MRN: [id number]  HPI The patient comes in today for followup of his known severe COPD with chronic respiratory failure.  He has not had a recent acute exacerbation of her pulmonary infection since his last visit.  He did have more difficulty with his breathing this summer, but has improved since the weather has become cooler.  He is maintaining on his bronchodilator regimen and oxygen.   Review of Systems  Constitutional: Negative for fever and unexpected weight change.  HENT: Positive for nosebleeds, congestion and rhinorrhea. Negative for ear pain, sore throat, sneezing, trouble swallowing, dental problem, postnasal drip and sinus pressure.   Eyes: Negative for redness and itching.  Respiratory: Positive for shortness of breath. Negative for cough, chest tightness and wheezing.   Cardiovascular: Negative for palpitations and leg swelling.  Gastrointestinal: Negative for nausea and vomiting.  Genitourinary: Negative for dysuria.  Musculoskeletal: Negative for joint swelling.  Skin: Negative for rash.  Neurological: Negative for headaches.  Hematological: Bruises/bleeds easily.  Psychiatric/Behavioral: Positive for dysphoric mood. The patient is not nervous/anxious.        Objective:   Physical Exam Thin male in no acute distress Nose without purulence or discharge noted Chest with decreased breath sounds throughout, no wheezing Cardiac exam with regular rate and rhythm Lower extremities with no significant edema, no cyanosis Alert and oriented, moves all 4 extremities.       Assessment & Plan:

## 2012-05-03 NOTE — Patient Instructions (Addendum)
No change in medications Stay as active as possible. Make sure you get your flu shot next week. followup with me in 4 mos, but call if having issues.

## 2012-05-03 NOTE — Telephone Encounter (Signed)
Error. Wrong pt. Brendan Herring

## 2012-05-03 NOTE — Assessment & Plan Note (Signed)
The patient appears to be at his usual baseline from a breathing standpoint.  He is doing better since the weather has become cooler.  I have stressed to him to stay on his current bronchodilator regimen, and to work on some type of conditioning program as much as possible.  I have reminded him about the flu shot, and he is going to get this with his primary care physician next week.

## 2012-08-24 ENCOUNTER — Telehealth: Payer: Self-pay | Admitting: Pulmonary Disease

## 2012-08-24 MED ORDER — CEFDINIR 300 MG PO CAPS: 300.0000 mg | ORAL_CAPSULE | Freq: Two times a day (BID) | ORAL | Status: DC

## 2012-08-24 MED ORDER — PREDNISONE 10 MG PO TABS: ORAL_TABLET | ORAL | Status: DC

## 2012-08-24 NOTE — Telephone Encounter (Signed)
ATC pt line bust x 4 WCB

## 2012-08-24 NOTE — Telephone Encounter (Signed)
I spoke with pt and is aware of KC recs. He voiced his understanding and had no questions, rx has been sent to the pharmacy, nothing further was needed

## 2012-08-24 NOTE — Telephone Encounter (Signed)
Pt has problem with levaquin Would use omnicef 300mg  2 each day for 7 days

## 2012-08-24 NOTE — Telephone Encounter (Signed)
Ok to call in levaquin 750mg  one a day for 7 days, and also pred 40 for 2 days, 30 for 2 days, 20 for 2 days, 10 for 2 days, then stop. Tell him not to fool around if he is not getting better within 48-72 hrs, and call us if not improving.  Keep apptn with me upcoming.

## 2012-08-24 NOTE — Telephone Encounter (Signed)
I spoke with pt. C/o cough w/ yellow phlem chest congestion, nasal congestion, PND, wheezing, chest tx, increase SOB x weekend. No temp, c/s/n/v. He has taken mucinex Once. I offered OV but refused. He does not want to get out in this cold weather with him already sick and he already has an OV scheduled with KC on 09/06/12. He also stated it's not his fault "timing is not appropriate" when he got sick and it was not around his scheduled OV. Pt is requesting to have something called in for him. Please advise KC thanks  Allergies  Allergen Reactions  . Benazepril Hcl     REACTION: sensitive to sunlight  . Levofloxacin     REACTION: tendonitis

## 2012-09-06 ENCOUNTER — Encounter: Payer: Self-pay | Admitting: Pulmonary Disease

## 2012-09-06 ENCOUNTER — Ambulatory Visit (INDEPENDENT_AMBULATORY_CARE_PROVIDER_SITE_OTHER): Payer: Medicare Other | Admitting: Pulmonary Disease

## 2012-09-06 VITALS — BP 138/76 | HR 106 | Temp 98.3°F | Ht 73.0 in | Wt 132.2 lb

## 2012-09-06 DIAGNOSIS — J9610 Chronic respiratory failure, unspecified whether with hypoxia or hypercapnia: Secondary | ICD-10-CM

## 2012-09-06 DIAGNOSIS — J449 Chronic obstructive pulmonary disease, unspecified: Secondary | ICD-10-CM

## 2012-09-06 NOTE — Progress Notes (Signed)
  Subjective:    Patient ID: Brendan Herring, male    DOB: 05-16-1941, 72 y.o.   MRN: [id number]  HPI The pt comes in today for f/u of his severe emphysema with chronic respiratory failure.  He has had a recent acute exacerbation, and was treated with abx/pred.  He feels that he is much improved, but has not returned to his normal baseline.  He is no longer congested, nor is he bringing up purulent mucus.   Review of Systems  Constitutional: Negative for fever and unexpected weight change.  HENT: Positive for postnasal drip. Negative for ear pain, nosebleeds, congestion, sore throat, rhinorrhea, sneezing, trouble swallowing, dental problem and sinus pressure.   Eyes: Negative for redness and itching.  Respiratory: Positive for shortness of breath. Negative for cough, chest tightness and wheezing.   Cardiovascular: Negative for palpitations and leg swelling.  Gastrointestinal: Negative for nausea and vomiting.  Genitourinary: Negative for dysuria.  Musculoskeletal: Negative for joint swelling.  Skin: Negative for rash.  Neurological: Negative for headaches.  Hematological: Does not bruise/bleed easily.  Psychiatric/Behavioral: Negative for dysphoric mood. The patient is not nervous/anxious.        Objective:   Physical Exam Thin male in nad Nose without purulence or discharge noted. Chest with very decreased bs, no wheezing or rhonchi Cor with rrr, distant hs LE without edema, no cyanosis Alert and oriented, moves all 4.        Assessment & Plan:

## 2012-09-06 NOTE — Patient Instructions (Addendum)
Continue on your maintenance medications Try to get back to your conditioning program.  followup with me in 4 mos, but call if you are not getting back closer to your baseline.

## 2012-09-06 NOTE — Assessment & Plan Note (Signed)
The patient is getting over a recent acute exacerbation, and clearly is improved but not yet back to baseline.  I've asked him to continue on his maintenance medications, and to get back on his conditioning program.  If he does not return to his usual baseline, I would consider giving him a few weeks of prednisone to see if this will help.  He is to call me if he has worsening symptoms.

## 2012-12-13 ENCOUNTER — Other Ambulatory Visit: Payer: Self-pay | Admitting: Pulmonary Disease

## 2012-12-13 MED ORDER — BUDESONIDE-FORMOTEROL FUMARATE 160-4.5 MCG/ACT IN AERO: 2.0000 | INHALATION_SPRAY | Freq: Two times a day (BID) | RESPIRATORY_TRACT | Status: DC

## 2013-01-11 ENCOUNTER — Ambulatory Visit (INDEPENDENT_AMBULATORY_CARE_PROVIDER_SITE_OTHER): Payer: Medicare Other | Admitting: Pulmonary Disease

## 2013-01-11 ENCOUNTER — Encounter: Payer: Self-pay | Admitting: Pulmonary Disease

## 2013-01-11 VITALS — BP 142/84 | HR 91 | Temp 97.1°F | Ht 73.0 in | Wt 128.8 lb

## 2013-01-11 DIAGNOSIS — J9610 Chronic respiratory failure, unspecified whether with hypoxia or hypercapnia: Secondary | ICD-10-CM

## 2013-01-11 DIAGNOSIS — J449 Chronic obstructive pulmonary disease, unspecified: Secondary | ICD-10-CM

## 2013-01-11 NOTE — Patient Instructions (Addendum)
No change in meds. Try and stay as active as possible, and work on increased protein calories. Please call if you are having increased symptoms. followup with me in 4mos.

## 2013-01-11 NOTE — Progress Notes (Signed)
  Subjective:    Patient ID: Brendan Herring, male    DOB: 07-11-1941, 72 y.o.   MRN: [id number]  HPI Patient comes in today for followup of his severe emphysema with chronic respiratory failure.  He is maintaining on his current bronchodilator regimen and oxygen, and feels that his breathing is near his baseline.  He has had a few episodes of air hunger, and is asking questions today about palliation if and when he gets worse.  He denies any significant chest congestion or purulent mucus.   Review of Systems  Constitutional: Negative for fever and unexpected weight change.  HENT: Negative for ear pain, nosebleeds, congestion, sore throat, rhinorrhea, sneezing, trouble swallowing, dental problem, postnasal drip and sinus pressure.   Eyes: Negative for redness and itching.  Respiratory: Negative for cough, chest tightness, shortness of breath and wheezing.   Cardiovascular: Negative for palpitations and leg swelling.  Gastrointestinal: Negative for nausea and vomiting.  Genitourinary: Negative for dysuria.  Musculoskeletal: Negative for joint swelling.  Skin: Negative for rash.  Neurological: Negative for headaches.  Hematological: Does not bruise/bleed easily.  Psychiatric/Behavioral: Negative for dysphoric mood. The patient is not nervous/anxious.        Objective:   Physical Exam Thin male in no acute distress Nose without purulent or discharge noted Neck without lymphadenopathy or thyromegaly Chest with very diminished breath sounds throughout, no wheezing. Cardiac exam with regular rate and rhythm Lower extremities with no edema, no cyanosis Alert and oriented, moves all 4 extremities.       Assessment & Plan:

## 2013-01-11 NOTE — Assessment & Plan Note (Signed)
The patient continues on his bronchodilator regimen and oxygen, and feels that he is at a stable baseline.  He does have episodes of air hunger at times, and discuss with him some of the palliative care medications that we often use.  He does not feel that he is at that point currently.  He will followup with me in 4 months, but is to call if he has worsening symptoms.

## 2013-01-17 ENCOUNTER — Other Ambulatory Visit: Payer: Self-pay | Admitting: Pulmonary Disease

## 2013-04-08 ENCOUNTER — Other Ambulatory Visit: Payer: Self-pay | Admitting: Pulmonary Disease

## 2013-06-06 ENCOUNTER — Ambulatory Visit: Payer: Medicare Other | Admitting: Pulmonary Disease

## 2013-07-10 ENCOUNTER — Encounter: Payer: Self-pay | Admitting: Pulmonary Disease

## 2013-07-10 ENCOUNTER — Ambulatory Visit (INDEPENDENT_AMBULATORY_CARE_PROVIDER_SITE_OTHER): Payer: Medicare Other | Admitting: Pulmonary Disease

## 2013-07-10 VITALS — BP 138/82 | HR 78 | Temp 98.4°F | Ht 73.0 in | Wt 130.8 lb

## 2013-07-10 DIAGNOSIS — J9610 Chronic respiratory failure, unspecified whether with hypoxia or hypercapnia: Secondary | ICD-10-CM

## 2013-07-10 DIAGNOSIS — J438 Other emphysema: Secondary | ICD-10-CM

## 2013-07-10 DIAGNOSIS — J439 Emphysema, unspecified: Secondary | ICD-10-CM

## 2013-07-10 MED ORDER — PREDNISONE 10 MG PO TABS: 10.0000 mg | ORAL_TABLET | Freq: Every day | ORAL | Status: DC

## 2013-07-10 NOTE — Assessment & Plan Note (Signed)
The patient continues to have a decreased quality of life because of severe exertional intolerance. He is on maximal bronchodilator treatment, as well as oxygen. I would like to try him on a course of chronic prednisone at 10 mg a day, and see if he has a response. We have both discussed end-of-life issues and possible hospice intervention at some point in the future.

## 2013-07-10 NOTE — Patient Instructions (Signed)
Will start prednisone 10mg  one each am.  No change in your maintenance medications.  Stay as active as possible.  followup with me in 4mos.

## 2013-07-10 NOTE — Progress Notes (Signed)
  Subjective:    Patient ID: Brendan Herring, male    DOB: [birth year], 72 y.o.   MRN: [id number]  HPI Patient comes in today for followup of his known severe COPD with chronic respiratory failure. His exertional tolerance is at baseline, and he is not satisfied with his capabilities. He has not had increased wheezing, chest congestion, or cough with purulence. He has not had any chest discomfort or lower extremity edema.   Review of Systems  Constitutional: Negative for fever and unexpected weight change.  HENT: Negative for congestion, dental problem, ear pain, nosebleeds, postnasal drip, rhinorrhea, sinus pressure, sneezing, sore throat and trouble swallowing.   Eyes: Negative for redness and itching.  Respiratory: Positive for shortness of breath. Negative for cough, chest tightness and wheezing.   Cardiovascular: Negative for palpitations and leg swelling.  Gastrointestinal: Negative for nausea and vomiting.  Genitourinary: Negative for dysuria.  Musculoskeletal: Negative for joint swelling.  Skin: Negative for rash.  Neurological: Negative for headaches.  Hematological: Does not bruise/bleed easily.  Psychiatric/Behavioral: Negative for dysphoric mood. The patient is not nervous/anxious.        Objective:   Physical Exam Thin male in no acute distress Nose without purulence or discharge noted Neck without lymphadenopathy or thyromegaly Chest with very diminished breath sounds, no wheezes or rhonchi Cardiac exam with regular rate and rhythm Lower extremities without edema, no cyanosis Alert and oriented, moves all 4 extremities.       Assessment & Plan:

## 2013-07-19 ENCOUNTER — Other Ambulatory Visit: Payer: Self-pay | Admitting: Pulmonary Disease

## 2013-08-15 ENCOUNTER — Other Ambulatory Visit: Payer: Self-pay | Admitting: Pulmonary Disease

## 2013-08-23 ENCOUNTER — Telehealth: Payer: Self-pay | Admitting: Pulmonary Disease

## 2013-08-23 MED ORDER — CEFDINIR 300 MG PO CAPS: ORAL_CAPSULE | ORAL | Status: DC

## 2013-08-23 NOTE — Telephone Encounter (Signed)
Called and spoke with pt and he stated that he has bronchitis.  Dark green sputum x 3 days.  Not sure if he has a fever but does have some Increase in SOB since these symptoms started.  [facility]  Please advise. Thanks  Allergies  Allergen Reactions  . Benazepril Hcl     REACTION: sensitive to sunlight  . Levofloxacin     REACTION: tendonitis     Current Outpatient Prescriptions on File Prior to Visit  Medication Sig Dispense Refill  . albuterol (PROAIR HFA) 108 (90 BASE) MCG/ACT inhaler Inhale 2 puffs into the lungs every 4 (four) hours as needed.  1 Inhaler  5  . albuterol (PROVENTIL) (2.5 MG/3ML) 0.083% nebulizer solution Take 2.5 mg by nebulization 4 (four) times daily as needed.        [name]. aspirin 81 MG tablet Take 81 mg by mouth daily.        . Calcium Carbonate-Vitamin D (CALCIUM 600-D) 600-400 MG-UNIT per tablet Take 2 tablets by mouth daily.       . Cholecalciferol (VITAMIN D) 2000 UNITS CAPS Take by mouth.      . ciclopirox (PENLAC) 8 % solution       . diltiazem (CARDIZEM) 120 MG tablet Take 120 mg by mouth daily.        . dorzolamide (TRUSOPT) 2 % ophthalmic solution Place 1 drop into both eyes 3 (three) times daily.        . Ensure Plus (ENSURE PLUS) LIQD 1 can every 6 hours       . ketoconazole (NIZORAL) 2 % shampoo       . latanoprost (XALATAN) 0.005 % ophthalmic solution Place 1 drop into both eyes at bedtime.        [name]. levothyroxine (SYNTHROID, LEVOTHROID) 100 MCG tablet Take 100 mcg by mouth daily.        [name]. lisinopril (PRINIVIL,ZESTRIL) 20 MG tablet Take 20 mg by mouth daily.        . predniSONE (DELTASONE) 10 MG tablet TAKE 1 TABLET BY MOUTH ONCE DAILY WITH BREAKFAST  30 tablet  5  . SPIRIVA HANDIHALER 18 MCG inhalation capsule INHALE CONTENTS OF 1 CAPSULE VIA HANDIHALER ONCE DAILY  90 capsule  0  . SYMBICORT 160-4.5 MCG/ACT inhaler INHALE 2 PUFFS BY MOUTH TWICE DAILY  1 Inhaler  3   No current facility-administered medications on file prior to  visit.

## 2013-08-23 NOTE — Telephone Encounter (Signed)
Ok to call in omnicef 300mg, take 2 each day for 5 days.  

## 2013-08-23 NOTE — Telephone Encounter (Signed)
Called, spoke with pt . Informed him of below recs per [facility].  [facility]  Pt is to call back if symptoms do not improve or worse.

## 2013-09-01 ENCOUNTER — Telehealth: Payer: Self-pay | Admitting: Pulmonary Disease

## 2013-09-01 MED ORDER — AMOXICILLIN-POT CLAVULANATE 875-125 MG PO TABS: 1.0000 | ORAL_TABLET | Freq: Two times a day (BID) | ORAL | Status: DC

## 2013-09-01 NOTE — Telephone Encounter (Signed)
Spoke with pt. C/o PND, nasal congestion, prod cough w/ dark green phlem, wheezing, chest tx, increase SOB w/ activity. No body aches, no fever, no chills, no sweats. On 08/23/13 KC called in omnicef x 5 days. We have no openings with any provider in the office today. Please advise KC thanks  Allergies  Allergen Reactions  . Benazepril Hcl     REACTION: sensitive to sunlight  . Levofloxacin     REACTION: tendonitis

## 2013-09-01 NOTE — Telephone Encounter (Signed)
Spoke with pt and [facility] He verbalized understanding  No questions  Rx was sent to pharm

## 2013-09-01 NOTE — Telephone Encounter (Signed)
Ok to call in augmentin 875 bid for 7 days.  Take on full stomach with large glass of water. If he does not get better with this, needs ov.

## 2013-09-12 ENCOUNTER — Telehealth: Payer: Self-pay | Admitting: Pulmonary Disease

## 2013-09-12 NOTE — Telephone Encounter (Signed)
Spoke with pt. He has been struggling with a dry cough and chest congestion x1 week. [facility] but it has come back. Requesting an appointment for tomorrow. He has been scheduled with PW at 10:45am.

## 2013-09-13 ENCOUNTER — Encounter: Payer: Self-pay | Admitting: Critical Care Medicine

## 2013-09-13 ENCOUNTER — Ambulatory Visit (INDEPENDENT_AMBULATORY_CARE_PROVIDER_SITE_OTHER): Payer: Medicare Other | Admitting: Critical Care Medicine

## 2013-09-13 ENCOUNTER — Ambulatory Visit (INDEPENDENT_AMBULATORY_CARE_PROVIDER_SITE_OTHER)
Admission: RE | Admit: 2013-09-13 | Discharge: 2013-09-13 | Disposition: A | Discharge status: Home / Self Care | Payer: Medicare Other | Source: Ambulatory Visit | Attending: Critical Care Medicine | Admitting: Critical Care Medicine

## 2013-09-13 VITALS — BP 174/100 | HR 99 | Temp 99.0°F | Ht 73.0 in | Wt 132.2 lb

## 2013-09-13 DIAGNOSIS — J438 Other emphysema: Secondary | ICD-10-CM

## 2013-09-13 DIAGNOSIS — J441 Chronic obstructive pulmonary disease with (acute) exacerbation: Secondary | ICD-10-CM

## 2013-09-13 DIAGNOSIS — J439 Emphysema, unspecified: Secondary | ICD-10-CM

## 2013-09-13 MED ORDER — MOXIFLOXACIN HCL 400 MG PO TABS: 400.0000 mg | ORAL_TABLET | Freq: Every day | ORAL | Status: DC

## 2013-09-13 MED ORDER — PREDNISONE 10 MG PO TABS: ORAL_TABLET | ORAL | Status: DC

## 2013-09-13 NOTE — Patient Instructions (Signed)
Chest xray today Increase prednisone 10mg  Take 4 for two days three for two days two for two days then one daily Avelox one daily for 5days No change in inhaled medications Return 2 weeks Dr Shelle Ironlance

## 2013-09-13 NOTE — Progress Notes (Signed)
Subjective:    Patient ID: Brendan Herring, male    DOB: 10/09/1940, 73 y.o.   MRN: [id number]  HPI 09/13/2013 Chief Complaint  Patient presents with  . Acute Visit    [facility] pt.  Nonprod cough, chest congestion, increased SOB, wheezing, and chest tightness.  Symptoms started beginning of Jan.  Has been on 2 rounds of abx which have helped but symptoms haven't completely cleared.  Ongoing chest congestion, going on for 1 months. Rx ABX omnicepf  and helped but persisting.  Then got a 7d supply augmentin. ABX but still an issue. No mucus now, feels congested. Notes more dyspnea.   Now on pred 10mg  /d .  Mucus is deep.        Review of Systems Constitutional:   No  weight loss, night sweats,  Fevers, chills, fatigue, lassitude. HEENT:   No headaches,  Difficulty swallowing,  Tooth/dental problems,  Sore throat,                No sneezing, itching, ear ache, nasal congestion, post nasal drip,   CV:  No chest pain,  Orthopnea, PND, swelling in lower extremities, anasarca, dizziness, palpitations  GI  No heartburn, indigestion, abdominal pain, nausea, vomiting, diarrhea, change in bowel habits, loss of appetite  Resp: Notes shortness of breath with exertion and at rest.  Notes excess mucus, notes productive cough,  No non-productive cough,  No coughing up of blood.  Notes  change in color of mucus.  Notes  wheezing.  No chest wall deformity  Skin: no rash or lesions.  GU: no dysuria, change in color of urine, no urgency or frequency.  No flank pain.  MS:  No joint pain or swelling.  No decreased range of motion.  No back pain.  Psych:  No change in mood or affect. No depression or anxiety.  No memory loss.     Objective:   Physical Exam Filed Vitals:   09/13/13 1047  BP: 174/100  Pulse: 99  Temp: 99 F (37.2 C)  TempSrc: Oral  Height: 6\' 1"  (1.854 m)  Weight: 132 lb 3.2 oz (59.966 kg)  SpO2: 97%    Gen: Pleasant, well-nourished, in no distress,  normal affect  ENT: No  lesions,  mouth clear,  oropharynx clear, no postnasal drip  Neck: No JVD, no TMG, no carotid bruits  Lungs: No use of accessory muscles, no dullness to percussion, scattered rhonchi bilateral and both lower lobes with expired wheezes  Cardiovascular: RRR, heart sounds normal, no murmur or gallops, no peripheral edema  Abdomen: soft and NT, no HSM,  BS normal  Musculoskeletal: No deformities, no cyanosis or clubbing  Neuro: alert, non focal  Skin: Warm, no lesions or rashes  Dg Chest 2 View  09/13/2013   CLINICAL DATA:  Cough, history bronchitis, history of prior tobacco use  EXAM: CHEST  2 VIEW  COMPARISON:  DG CHEST 1V PORT dated 10/26/2009  FINDINGS: The lungs are hyperinflated. There is flattening of the hemidiaphragms. Increased AP diameter chest is appreciated. No focal regions of consolidation or focal infiltrates. Cardiac silhouette is within normal limits. The osseous structures unremarkable.  IMPRESSION: No evidence of acute cardiopulmonary disease. Severe emphysematous changes once again appreciated.   Electronically Signed   By: Salome HolmesHector  Cooper M.D.   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 COPD gold stage D. with acute exacerbation No evidence on  chest x-ray for pneumonia Plan Increase prednisone 10mg  Take 4 for two days three for two days two for two days then one daily Avelox one daily for 5days No change in inhaled medications Return 2 weeks Dr Shelle Iron    Updated Medication List Outpatient Encounter Prescriptions as of 09/13/2013  Medication Sig  . albuterol (PROAIR HFA) 108 (90 BASE) MCG/ACT inhaler Inhale 2 puffs into the lungs every 4 (four) hours as needed.  [name] albuterol (PROVENTIL) (2.5 MG/3ML) 0.083% nebulizer solution Take 2.5 mg by nebulization 4 (four) times daily as needed.    [name] aspirin 81 MG tablet Take 81 mg by mouth daily.    . Calcium Carbonate-Vitamin D (CALCIUM 600-D)  600-400 MG-UNIT per tablet Take 2 tablets by mouth daily.   . Cholecalciferol (VITAMIN D) 2000 UNITS CAPS Take by mouth daily.   [name] diltiazem (CARDIZEM) 120 MG tablet Take 120 mg by mouth daily.    . dorzolamide (TRUSOPT) 2 % ophthalmic solution Place 1 drop into both eyes 3 (three) times daily.    . Ensure Plus (ENSURE PLUS) LIQD 1 can every day  . latanoprost (XALATAN) 0.005 % ophthalmic solution Place 1 drop into both eyes at bedtime.    [name] levothyroxine (SYNTHROID, LEVOTHROID) 100 MCG tablet Take 100 mcg by mouth daily.    [name] lisinopril (PRINIVIL,ZESTRIL) 20 MG tablet Take 20 mg by mouth daily.    . predniSONE (DELTASONE) 10 MG tablet Take 4 for two days three for two days two for two days then resume one daily  . SPIRIVA HANDIHALER 18 MCG inhalation capsule INHALE CONTENTS OF 1 CAPSULE VIA HANDIHALER ONCE DAILY  . SYMBICORT 160-4.5 MCG/ACT inhaler INHALE 2 PUFFS BY MOUTH TWICE DAILY  . [DISCONTINUED] predniSONE (DELTASONE) 10 MG tablet TAKE 1 TABLET BY MOUTH ONCE DAILY WITH BREAKFAST  . moxifloxacin (AVELOX) 400 MG tablet Take 1 tablet (400 mg total) by mouth daily.  . [DISCONTINUED] amoxicillin-clavulanate (AUGMENTIN) 875-125 MG per tablet Take 1 tablet by mouth 2 (two) times daily.  . [DISCONTINUED] cefdinir (OMNICEF) 300 MG capsule take 2 each day for 5 days  . [DISCONTINUED] ciclopirox (PENLAC) 8 % solution   . [DISCONTINUED] ketoconazole (NIZORAL) 2 % shampoo

## 2013-09-14 NOTE — Assessment & Plan Note (Signed)
COPD gold stage D. with acute exacerbation No evidence on chest x-ray for pneumonia Plan Increase prednisone 10mg  Take 4 for two days three for two days two for two days then one daily Avelox one daily for 5days No change in inhaled medications Return 2 weeks Dr Shelle Ironlance

## 2013-09-14 NOTE — Progress Notes (Signed)
Quick Note:  lmomtcb for pt ______ 

## 2013-09-14 NOTE — Progress Notes (Signed)
Quick Note:  Notify the patient that the Xray is stable and no pneumonia No change in medications are recommended. Continue current meds as prescribed at last office visit ______ 

## 2013-09-21 ENCOUNTER — Emergency Department (HOSPITAL_COMMUNITY): Payer: Medicare Other

## 2013-09-21 ENCOUNTER — Encounter (HOSPITAL_COMMUNITY): Payer: Self-pay | Admitting: Emergency Medicine

## 2013-09-21 ENCOUNTER — Emergency Department (HOSPITAL_COMMUNITY)
Admission: EM | Admit: 2013-09-21 | Discharge: 2013-09-22 | Disposition: A | Discharge status: Home / Self Care | Payer: Medicare Other | Attending: Emergency Medicine | Admitting: Emergency Medicine

## 2013-09-21 DIAGNOSIS — L0201 Cutaneous abscess of face: Secondary | ICD-10-CM | POA: Insufficient documentation

## 2013-09-21 DIAGNOSIS — Z79899 Other long term (current) drug therapy: Secondary | ICD-10-CM | POA: Insufficient documentation

## 2013-09-21 DIAGNOSIS — J438 Other emphysema: Secondary | ICD-10-CM | POA: Insufficient documentation

## 2013-09-21 DIAGNOSIS — L03211 Cellulitis of face: Secondary | ICD-10-CM | POA: Insufficient documentation

## 2013-09-21 DIAGNOSIS — K130 Diseases of lips: Secondary | ICD-10-CM | POA: Insufficient documentation

## 2013-09-21 DIAGNOSIS — I2510 Atherosclerotic heart disease of native coronary artery without angina pectoris: Secondary | ICD-10-CM | POA: Insufficient documentation

## 2013-09-21 DIAGNOSIS — Z87891 Personal history of nicotine dependence: Secondary | ICD-10-CM | POA: Insufficient documentation

## 2013-09-21 DIAGNOSIS — H409 Unspecified glaucoma: Secondary | ICD-10-CM | POA: Insufficient documentation

## 2013-09-21 DIAGNOSIS — I10 Essential (primary) hypertension: Secondary | ICD-10-CM | POA: Insufficient documentation

## 2013-09-21 DIAGNOSIS — Z792 Long term (current) use of antibiotics: Secondary | ICD-10-CM | POA: Insufficient documentation

## 2013-09-21 DIAGNOSIS — Z9861 Coronary angioplasty status: Secondary | ICD-10-CM | POA: Insufficient documentation

## 2013-09-21 DIAGNOSIS — E079 Disorder of thyroid, unspecified: Secondary | ICD-10-CM | POA: Insufficient documentation

## 2013-09-21 DIAGNOSIS — Z7982 Long term (current) use of aspirin: Secondary | ICD-10-CM | POA: Insufficient documentation

## 2013-09-21 DIAGNOSIS — IMO0002 Reserved for concepts with insufficient information to code with codable children: Secondary | ICD-10-CM | POA: Insufficient documentation

## 2013-09-21 HISTORY — DX: Disorder of thyroid, unspecified: E07.9

## 2013-09-21 HISTORY — DX: Pure hypercholesterolemia, unspecified: E78.00

## 2013-09-21 HISTORY — DX: Atherosclerotic heart disease of native coronary artery without angina pectoris: I25.10

## 2013-09-21 HISTORY — DX: Unspecified glaucoma: H40.9

## 2013-09-21 LAB — COMPREHENSIVE METABOLIC PANEL
ALBUMIN: 2.9 g/dL — AB (ref 3.5–5.2)
ALK PHOS: 60 U/L (ref 39–117)
ALT: 17 U/L (ref 0–53)
AST: 15 U/L (ref 0–37)
BUN: 28 mg/dL — AB (ref 6–23)
CALCIUM: 9.3 mg/dL (ref 8.4–10.5)
CO2: 34 mEq/L — ABNORMAL HIGH (ref 19–32)
CREATININE: 0.81 mg/dL (ref 0.50–1.35)
Chloride: 101 mEq/L (ref 96–112)
GFR calc non Af Amer: 85 mL/min — ABNORMAL LOW (ref >90)
GLUCOSE: 121 mg/dL — AB (ref 70–99)
Potassium: 4.4 mEq/L (ref 3.7–5.3)
Sodium: 146 mEq/L (ref 137–147)
TOTAL PROTEIN: 6.5 g/dL (ref 6.0–8.3)
Total Bilirubin: 0.4 mg/dL (ref 0.3–1.2)

## 2013-09-21 LAB — CBC WITH DIFFERENTIAL/PLATELET
BASOS PCT: 0 % (ref 0–1)
Basophils Absolute: 0 10*3/uL (ref 0.0–0.1)
EOS PCT: 0 % (ref 0–5)
Eosinophils Absolute: 0 10*3/uL (ref 0.0–0.7)
HCT: 42.3 % (ref 39.0–52.0)
HEMOGLOBIN: 13.9 g/dL (ref 13.0–17.0)
LYMPHS ABS: 1 10*3/uL (ref 0.7–4.0)
Lymphocytes Relative: 6 % — ABNORMAL LOW (ref 12–46)
MCH: 34.8 pg — ABNORMAL HIGH (ref 26.0–34.0)
MCHC: 32.9 g/dL (ref 30.0–36.0)
MCV: 106 fL — AB (ref 78.0–100.0)
MONO ABS: 1.6 10*3/uL — AB (ref 0.1–1.0)
MONOS PCT: 9 % (ref 3–12)
Neutro Abs: 14.9 10*3/uL — ABNORMAL HIGH (ref 1.7–7.7)
Neutrophils Relative %: 85 % — ABNORMAL HIGH (ref 43–77)
Platelets: 252 10*3/uL (ref 150–400)
RBC: 3.99 MIL/uL — AB (ref 4.22–5.81)
RDW: 12.8 % (ref 11.5–15.5)
WBC: 17.5 10*3/uL — ABNORMAL HIGH (ref 4.0–10.5)

## 2013-09-21 MED ORDER — VANCOMYCIN HCL IN DEXTROSE 750-5 MG/150ML-% IV SOLN
750.0000 mg | Freq: Two times a day (BID) | INTRAVENOUS | Status: DC
  Filled 2013-09-21: qty 150

## 2013-09-21 MED ORDER — CLINDAMYCIN PHOSPHATE 600 MG/50ML IV SOLN
600.0000 mg | Freq: Once | INTRAVENOUS | Status: AC
  Administered 2013-09-21: 600 mg via INTRAVENOUS
  Filled 2013-09-21: qty 50

## 2013-09-21 MED ORDER — VANCOMYCIN HCL IN DEXTROSE 1-5 GM/200ML-% IV SOLN
1000.0000 mg | Freq: Once | INTRAVENOUS | Status: AC
  Administered 2013-09-21: 1000 mg via INTRAVENOUS
  Filled 2013-09-21: qty 200

## 2013-09-21 MED ORDER — SODIUM CHLORIDE 0.9 % IV SOLN
Freq: Once | INTRAVENOUS | Status: AC
  Administered 2013-09-21: 21:00:00 via INTRAVENOUS

## 2013-09-21 MED ORDER — IOHEXOL 300 MG/ML  SOLN
75.0000 mL | Freq: Once | INTRAMUSCULAR | Status: AC | PRN
  Administered 2013-09-21: 75 mL via INTRAVENOUS

## 2013-09-21 MED ORDER — VANCOMYCIN HCL IN DEXTROSE 1-5 GM/200ML-% IV SOLN
1000.0000 mg | Freq: Once | INTRAVENOUS | Status: DC
  Filled 2013-09-21: qty 200

## 2013-09-21 MED ORDER — DEXAMETHASONE SODIUM PHOSPHATE 10 MG/ML IJ SOLN
10.0000 mg | Freq: Once | INTRAMUSCULAR | Status: AC
  Administered 2013-09-21: 10 mg via INTRAVENOUS
  Filled 2013-09-21: qty 1

## 2013-09-21 NOTE — ED Notes (Signed)
This RN called pharmacy to send Vancomycin via tube system.

## 2013-09-21 NOTE — ED Provider Notes (Addendum)
Medical screening examination/treatment/procedure(s) were conducted as a shared visit with non-physician practitioner(s) and myself.  I personally evaluated the patient during the encounter.  Worsening Lower lip swelling.  No difficulty breathing or swallowing. Purulent drainage from L corner of mouth. Induration to L cheek. See photos.  EKG Interpretation   None             Glynn OctaveStephen Diara Chaudhari, MD 09/21/13 2010  Glynn OctaveStephen Danny Zimny, MD 09/21/13 2010

## 2013-09-21 NOTE — ED Notes (Signed)
Pt reports bronchitis on Jan 6 and was prescribed an abx, but did not help and then was prescribed a second abx and that did not help either per patient report. He went back to his pulmonologist and was prescribed a third abx in the same family as the abx he is allergic to, Levofloxacin, and noticed circumoral swelling and a small fever blister developed. He was then prescribed a fourth abx, doxycycline, which has not helped either and his lower lip has become infected, draining, and black, cultures were taken at his pcp but cultures have not resulted yet. Pt also has been having high blood pressure.Went to see his PCP today and was advised to come here for evaluation of infected lip.

## 2013-09-21 NOTE — ED Notes (Signed)
Vancomycin cancelled per [name], NP.

## 2013-09-21 NOTE — ED Notes (Signed)
Pt on home O2, placed on O2 in the lobby

## 2013-09-21 NOTE — ED Notes (Signed)
Pt sent here from [facility]. Pt having swelling to lower lip since 2/2, has been taking doxycycline >24 hours with no relief. Denies any swelling to tongue or airway. Pt has chronic sob, wears 3L o2 pta and spo2 90% at triage.

## 2013-09-21 NOTE — Consult Note (Addendum)
ANTIBIOTIC CONSULT NOTE - INITIAL  Pharmacy Consult for Vancomycin Indication: cellulitis  Allergies  Allergen Reactions  . Benazepril Hcl     REACTION: sensitive to sunlight  . Levofloxacin     REACTION: tendonitis    Patient Measurements: Height: 6' 0.83" (185 cm) Weight: 132 lb 4.4 oz (60 kg) IBW/kg (Calculated) : 79.52  Vital Signs: Temp: 99 F (37.2 C) (02/05 1912) Temp src: Oral (02/05 1912) BP: 152/98 mmHg (02/05 1912) Pulse Rate: 87 (02/05 1912) Intake/Output from previous day:   Intake/Output from this shift:    Labs: No results found for this basename: WBC, HGB, PLT, LABCREA, CREATININE,  in the last 72 hours Estimated Creatinine Clearance: 68.8 ml/min (by C-G formula based on Cr of 0.67).  Microbiology: No results found for this or any previous visit (from the past 720 hour(s)).  Medical History: Past Medical History  Diagnosis Date  . Emphysema   . Hypertension   . Coronary artery disease   . Thyroid disease   . Glaucoma   . Hypercholesterolemia    Assessment: Brendan Herring presents to the ED with lower lip swelling. He has been on po doxycycline with minimal improvement. Now to begin IV vancomycin. Labs pending.  Goal of Therapy:  Vancomycin trough level 10-15 mcg/ml  Plan:  1) Vancomycin 1g IV x 1 now 2) Follow up labs for further dosing  Fredrik RiggerMarkle, Kandace Elrod Sue 09/21/2013,8:18 PM  CMP has resulted and sCr is 0.81 with CrCl 7368ml/min. Will dose vancomycin 750mg  IV q12.  Fredrik RiggerMarkle, Keyanah Kozicki Sue 09/21/2013, 8:59 PM

## 2013-09-21 NOTE — ED Provider Notes (Signed)
CSN: [id number]     Arrival date & time 09/21/13  1641 History   First MD Initiated Contact with Patient 09/21/13 1954     Chief Complaint  Patient presents with  . Abscess   (Consider location/radiation/quality/duration/timing/severity/associated sxs/prior Treatment) HPI Comments: Patient noted a "fever blister" in the center of his lower lip 6 days ago  It has progressively gotten larger. Saw his PCP 4 days ago given topical Bactroban only to have it get larger, than started on Doxycycline for 1 day without any effect.  Saw PCP again today and sent to ED for evaluation .  Patient is a 73 y.o. male presenting with abscess. The history is provided by the patient.  Abscess Location:  Mouth Mouth abscess location:  L inner cheek, lower inner lip and lower outer lip Size:  6cm Abscess quality: draining, induration, redness, warmth and weeping   Abscess quality: no fluctuance   Red streaking: yes   Duration:  6 days Progression:  Worsening Chronicity:  New Context: immunosuppression   Context: not skin injury   Relieved by:  Nothing Worsened by:  Draining/squeezing Ineffective treatments:  Topical antibiotics and oral antibiotics Associated symptoms: no fever, no headaches and no nausea   Risk factors: no family hx of MRSA, no hx of MRSA and no prior abscess     Past Medical History  Diagnosis Date  . Emphysema   . Hypertension   . Coronary artery disease   . Thyroid disease   . Glaucoma   . Hypercholesterolemia    Past Surgical History  Procedure Laterality Date  . Back surgery  1982  . Cataract extraction  1990    bilateral  . Tonsillectomy  1994  . Wisdom tooth extraction  1960  . Vasectomy  1970  . Coronary angioplasty with stent placement     Family History  Problem Relation Age of Onset  . Emphysema Father   . Heart failure Father   . Heart failure Brother   . Stroke Father    History  Substance Use Topics  . Smoking status: Former Smoker -- 1.00 packs/day  for 35 years    Types: Cigarettes    Quit date: 08/17/1994  . Smokeless tobacco: Not on file  . Alcohol Use: Not on file    Review of Systems  Unable to perform ROS Constitutional: Negative for fever and chills.  HENT: Positive for mouth sores. Negative for dental problem and drooling.   Gastrointestinal: Negative for nausea.  Musculoskeletal: Negative for neck pain and neck stiffness.  Skin: Positive for wound.  Neurological: Negative for headaches.  All other systems reviewed and are negative.    Allergies  Benazepril hcl and Levofloxacin  Home Medications   Current Outpatient Rx  Name  Route  Sig  Dispense  Refill  . albuterol (PROAIR HFA) 108 (90 BASE) MCG/ACT inhaler   Inhalation   Inhale 2 puffs into the lungs every 4 (four) hours as needed.   1 Inhaler   5   . albuterol (PROVENTIL) (2.5 MG/3ML) 0.083% nebulizer solution   Nebulization   Take 2.5 mg by nebulization 4 (four) times daily as needed.           [name] aspirin 81 MG tablet   Oral   Take 81 mg by mouth daily.           . benzonatate (TESSALON) 100 MG capsule   Oral   Take 100 mg by mouth 3 (three) times daily as needed for cough.         [name]  budesonide-formoterol (SYMBICORT) 160-4.5 MCG/ACT inhaler   Inhalation   Inhale 2 puffs into the lungs 2 (two) times daily.         . Calcium Carbonate-Vitamin D (CALCIUM 600-D) 600-400 MG-UNIT per tablet   Oral   Take 2 tablets by mouth daily.          . Cholecalciferol (VITAMIN D) 2000 UNITS CAPS   Oral   Take by mouth daily.          [name]. diltiazem (CARDIZEM) 120 MG tablet   Oral   Take 120 mg by mouth daily.           . dorzolamide (TRUSOPT) 2 % ophthalmic solution   Both Eyes   Place 1 drop into both eyes 3 (three) times daily.           [name]. doxycycline (VIBRA-TABS) 100 MG tablet   Oral   Take 100 mg by mouth 2 (two) times daily. Started on 09-18-13 for 10 days. On day 3 of therapy         . latanoprost (XALATAN) 0.005 % ophthalmic  solution   Both Eyes   Place 1 drop into both eyes at bedtime.           [name]. levothyroxine (SYNTHROID, LEVOTHROID) 100 MCG tablet   Oral   Take 100 mcg by mouth daily.           [name]. lisinopril (PRINIVIL,ZESTRIL) 20 MG tablet   Oral   Take 40 mg by mouth daily.          [name]. moxifloxacin (AVELOX) 400 MG tablet   Oral   Take 1 tablet (400 mg total) by mouth daily.   5 tablet   0   . mupirocin ointment (BACTROBAN) 2 %   Nasal   Place 1 application into the nose 3 (three) times daily.         [name]. neomycin-colistin-hydrocortisone-thonzonium (CORTISPORIN TC) 3.10-17-08-0.5 MG/ML otic suspension   Both Ears   Place 2 drops into both ears 4 (four) times daily. Started on 09-18-13. Patient takes as needed for pain/ infection         . predniSONE (DELTASONE) 10 MG tablet      10 mg daily after breakfast.         . promethazine-dextromethorphan (PROMETHAZINE-DM) 6.25-15 MG/5ML syrup   Oral   Take 2.5 mLs by mouth 4 (four) times daily as needed for cough.         . terbinafine (LAMISIL) 250 MG tablet   Oral   Take 250 mg by mouth daily.         [name]. tiotropium (SPIRIVA) 18 MCG inhalation capsule   Inhalation   Place 18 mcg into inhaler and inhale daily.         . clindamycin (CLEOCIN) 150 MG capsule   Oral   Take 1 capsule (150 mg total) by mouth every 6 (six) hours.   28 capsule   0    BP 160/88  Pulse 100  Temp(Src) 98.9 F (37.2 C) (Oral)  Resp 18  Ht 6' 0.83" (1.85 m)  Wt 132 lb 4.4 oz (60 kg)  BMI 17.53 kg/m2  SpO2 98% Physical Exam  Nursing note and vitals reviewed. Constitutional: He is oriented to person, place, and time. He appears well-developed and well-nourished. No distress.  HENT:  Head: Normocephalic.  Right Ear: External ear normal.  Left Ear: External ear normal.  Mouth/Throat: No oropharyngeal exudate.  Eyes: Pupils are equal, round, and reactive  to light.  Neck: Normal range of motion.  Cardiovascular: Normal rate and regular rhythm.    Pulmonary/Chest: Effort normal and breath sounds normal. No respiratory distress.  Normally on 3 L oxygen for COPD  Abdominal: Soft.  Musculoskeletal: Normal range of motion.  Lymphadenopathy:    He has no cervical adenopathy.  Neurological: He is alert and oriented to person, place, and time.  Skin: Skin is warm.  Lower lip swollen crusted draining from the corner L side. Swelling extends into buccal mucosa of L cheek  Does not involve gingiva     ED Course  Procedures (including critical care time) Labs Review Labs Reviewed  CBC WITH DIFFERENTIAL - Abnormal; Notable for the following:    WBC 17.5 (*)    RBC 3.99 (*)    MCV 106.0 (*)    MCH 34.8 (*)    Neutrophils Relative % 85 (*)    Neutro Abs 14.9 (*)    Lymphocytes Relative 6 (*)    Monocytes Absolute 1.6 (*)    All other components within normal limits  COMPREHENSIVE METABOLIC PANEL - Abnormal; Notable for the following:    CO2 34 (*)    Glucose, Bld 121 (*)    BUN 28 (*)    Albumin 2.9 (*)    GFR calc non Af Amer 85 (*)    All other components within normal limits  WOUND CULTURE  VIRAL CULTURE VIRC   Imaging Review Ct Maxillofacial W/cm  09/21/2013   CLINICAL DATA:  Lower lip abscess.  EXAM: CT MAXILLOFACIAL WITH CONTRAST  TECHNIQUE: Multidetector CT imaging of the maxillofacial structures was performed with intravenous contrast. Multiplanar CT image reconstructions were also generated. A small metallic BB was placed on the right temple in order to reliably differentiate right from left.  CONTRAST:  75mL OMNIPAQUE IOHEXOL 300 MG/ML  SOLN  COMPARISON:  None.  FINDINGS: There is a low-density, peripherally enhancing collection in the central lower lip, measuring 11 mm in diameter by a 45 mm in length. It is immediately subcutaneous. No involvement of the underlying bone. No floor of mouth inflammation. Patent major venous structures.  Symmetric and normal appearing salivary glands. No evidence of mass along the surfaces of  the visualized aerodigestive tract. Clear paranasal sinuses, mastoids, middle ears. No acute osseous findings. Intracranial atherosclerosis. Dilated perivascular space in the left basal ganglia. Cervical carotid atherosclerosis. Bilateral cataract resection.  IMPRESSION: 10 x 10 x 45 mm abscess in the lower lip. No deep space inflammation.   Electronically Signed   By: Tiburcio Pea M.D.   On: 09/21/2013 21:47    EKG Interpretation   None       MDM   1. Abscess of lip     Will obtain labs CT face to evaluate extent of abscess, start broad spectrum antibiotics  CT confirms large abscess.  Dr. Elisha Ponder consult in the recommended.  Additional clindamycin, as well as the originally ordered vancomycin.  He will see the patient.  In the morning for incision and drainage  Arman Filter, NP 09/21/13 2028  Arman Filter, NP 09/22/13 [phone]

## 2013-09-22 MED ORDER — CLINDAMYCIN HCL 150 MG PO CAPS: 150.0000 mg | ORAL_CAPSULE | Freq: Four times a day (QID) | ORAL | Status: DC

## 2013-09-22 NOTE — ED Provider Notes (Signed)
Medical screening examination/treatment/procedure(s) were conducted as a shared visit with non-physician practitioner(s) and myself.  I personally evaluated the patient during the encounter.  EKG Interpretation   None       Patient presents with rash and swelling of the lower lip.  Progressive worsening over the last 3-4 days.  Currently on doxy.  Exam with diffuse swelling of the upper lip with ulceration of the lip and oral mucosa on the left.  No significant errythema. Airway intact.  No trismus or stridor. CT with 10x10x4640mm abscess.  Patient given Vanc, Clinda, and decadron per ENT.  Will followup in ENT clinic tomorrow for I&D.  After history, exam, and medical workup I feel the patient has been appropriately medically screened and is safe for discharge home. Pertinent diagnoses were discussed with the patient. Patient was given return precautions.   Shon Batonourtney F Horton, MD 09/22/13 [phone]

## 2013-09-22 NOTE — Discharge Instructions (Signed)
Abscess An abscess (boil or furuncle) is an infected area on or under the skin. This area is filled with yellowish-white fluid (pus) and other material (debris). HOME CARE   Only take medicines as told by your doctor.  If you were given antibiotic medicine, take it as directed. Finish the medicine even if you start to feel better.  If gauze is used, follow your doctor's directions for changing the gauze.  To avoid spreading the infection:  Keep your abscess covered with a bandage.  Wash your hands well.  Do not share personal care items, towels, or whirlpools with others.  Avoid skin contact with others.  Keep your skin and clothes clean around the abscess.  Keep all doctor visits as told. GET HELP RIGHT AWAY IF:   You have more pain, puffiness (swelling), or redness in the wound site.  You have more fluid or blood coming from the wound site.  You have muscle aches, chills, or you feel sick.  You have a fever. MAKE SURE YOU:   Understand these instructions.  Will watch your condition.  Will get help right away if you are not doing well or get worse. Document Released: 01/20/2008 Document Revised: 02/02/2012 Document Reviewed: 10/16/2011 [facility] Patient Information 2014 [employer], [place]. A large abscess in her lower lip, confirmed by CT scan.  This was discussed with Dr. Emeline DarlingGore, ENT surgery, who recommended additional antibiotics be given to you I be he would like to see, you in his office in the morning for incision and drainage.  Please call first thing in the morning to set an appointment time

## 2013-09-24 ENCOUNTER — Telehealth (HOSPITAL_BASED_OUTPATIENT_CLINIC_OR_DEPARTMENT_OTHER): Payer: Self-pay | Admitting: Emergency Medicine

## 2013-09-24 LAB — WOUND CULTURE

## 2013-09-24 NOTE — ED Notes (Signed)
Lab called + wound culture + MRSA. Treated with clindamycin, sensitive to same. Attempt to contact patient by phone to notify, left voicemail to call flow manager #.

## 2013-09-27 ENCOUNTER — Encounter: Payer: Self-pay | Admitting: Pulmonary Disease

## 2013-09-27 ENCOUNTER — Ambulatory Visit (INDEPENDENT_AMBULATORY_CARE_PROVIDER_SITE_OTHER): Payer: Medicare Other | Admitting: Pulmonary Disease

## 2013-09-27 VITALS — BP 122/80 | HR 98 | Temp 98.1°F | Ht 73.0 in | Wt 124.8 lb

## 2013-09-27 DIAGNOSIS — J438 Other emphysema: Secondary | ICD-10-CM

## 2013-09-27 DIAGNOSIS — J439 Emphysema, unspecified: Secondary | ICD-10-CM

## 2013-09-27 DIAGNOSIS — J9610 Chronic respiratory failure, unspecified whether with hypoxia or hypercapnia: Secondary | ICD-10-CM

## 2013-09-27 NOTE — Assessment & Plan Note (Signed)
The patient is greatly improved after his recent treatment with an antibiotic and prednisone. He has not totally return to baseline, but feels this is more of a conditioning issue. He is to stay on his maintenance medications, and to work on strengthening. He has an appointment to see me in March, but is to call if he has a decline in his status again.

## 2013-09-27 NOTE — Patient Instructions (Signed)
No change in maintenance medications Work on improving conditioning and endurance. Call me if you feel you are having a relapse. Keep followup apptm with me in March.

## 2013-09-27 NOTE — Progress Notes (Signed)
   Subjective:    Patient ID: Brendan Herring, male    DOB: 01/03/1941, 73 y.o.   MRN: [id number]  HPI The patient comes in today for followup of his known severe COPD with chronic respiratory failure. He was seen by one of my partners 2 weeks ago with an acute exacerbation, and treated with antibiotics as well as a prednisone pulse.  He is much improved from the last visit, but has not completely returned to his usual baseline. He believes this is more related to deconditioning than anything else. He denies any significant chest congestion, and is not coughing up purulent mucus currently.   Review of Systems  Constitutional: Negative for fever and unexpected weight change.  HENT: Negative for congestion, dental problem, ear pain, nosebleeds, postnasal drip, rhinorrhea, sinus pressure, sneezing, sore throat and trouble swallowing.   Eyes: Negative for redness and itching.  Respiratory: Positive for shortness of breath. Negative for cough, chest tightness and wheezing.   Cardiovascular: Negative for palpitations and leg swelling.  Gastrointestinal: Negative for nausea and vomiting.  Genitourinary: Negative for dysuria.  Musculoskeletal: Negative for joint swelling.  Skin: Negative for rash.  Neurological: Negative for headaches.  Hematological: Does not bruise/bleed easily.  Psychiatric/Behavioral: Negative for dysphoric mood. The patient is not nervous/anxious.        Objective:   Physical Exam Thin male in no acute distress Nose without purulence or discharge noted Neck without lymphadenopathy or thyromegaly Chest with very distant breath sounds, no active wheezing or crackles Cardiac exam with regular rate and rhythm Lower extremities without edema, no cyanosis Alert and oriented, moves all 4 extremities.       Assessment & Plan:

## 2013-10-02 LAB — VIRAL CULTURE VIRC

## 2013-10-19 ENCOUNTER — Other Ambulatory Visit: Payer: Self-pay | Admitting: Pulmonary Disease

## 2013-11-06 ENCOUNTER — Encounter: Payer: Self-pay | Admitting: Pulmonary Disease

## 2013-11-06 ENCOUNTER — Ambulatory Visit (INDEPENDENT_AMBULATORY_CARE_PROVIDER_SITE_OTHER): Payer: Medicare Other | Admitting: Pulmonary Disease

## 2013-11-06 VITALS — BP 138/80 | HR 98 | Temp 98.7°F | Ht 73.0 in | Wt 134.4 lb

## 2013-11-06 DIAGNOSIS — J438 Other emphysema: Secondary | ICD-10-CM

## 2013-11-06 DIAGNOSIS — J9610 Chronic respiratory failure, unspecified whether with hypoxia or hypercapnia: Secondary | ICD-10-CM

## 2013-11-06 DIAGNOSIS — J439 Emphysema, unspecified: Secondary | ICD-10-CM

## 2013-11-06 NOTE — Assessment & Plan Note (Signed)
The patient is doing better than prior visit, but still has not returned to baseline since his acute exacerbation earlier in the year. He is trying to stay as active as possible, and is staying on his current bronchodilator regimen. He would like to try and come off chronic prednisone, and I have written out a weaning schedule.

## 2013-11-06 NOTE — Progress Notes (Signed)
   Subjective:    Patient ID: Brendan Herring, male    DOB: 03/29/1941, 73 y.o.   MRN: [id number]  HPI The patient comes in today for followup of his known gold D. COPD, along with chronic respiratory failure. He had an acute exacerbation earlier in the year, and has been slowly improving since that time. He is not quite back to baseline, but is getting stronger. He is staying on his medications, but would like to consider coming off prednisone.   Review of Systems  Constitutional: Positive for activity change. Negative for fever and unexpected weight change.  HENT: Negative for congestion, dental problem, ear pain, nosebleeds, postnasal drip, rhinorrhea, sinus pressure, sneezing, sore throat and trouble swallowing.   Eyes: Negative for redness and itching.  Respiratory: Positive for shortness of breath. Negative for cough, chest tightness and wheezing.   Cardiovascular: Negative for palpitations and leg swelling.  Gastrointestinal: Negative for nausea and vomiting.  Genitourinary: Negative for dysuria.  Musculoskeletal: Negative for joint swelling.  Skin: Negative for rash.  Neurological: Negative for headaches.  Hematological: Does not bruise/bleed easily.  Psychiatric/Behavioral: Negative for dysphoric mood. The patient is not nervous/anxious.        Objective:   Physical Exam Thin male in no acute distress Nose without purulence or discharge noted Neck without lymphadenopathy or thyromegaly Chest with diminished breath sounds throughout, no wheezing Cardiac exam with regular rate and rhythm Lower extremities with no significant edema, no cyanosis Alert and oriented, moves all 4 extremities.       Assessment & Plan:

## 2013-11-06 NOTE — Patient Instructions (Signed)
Decrease prednisone to 5mg  a day for a week, then 2.5mg  a day for a week, then can stop Try spiriva respimat instead of the handihaler, TWO inhalations each am.  Let me know what you prefer.  Stay as active as possible. followup with me again in 3-4 mos.

## 2014-01-25 ENCOUNTER — Telehealth: Payer: Self-pay | Admitting: Pulmonary Disease

## 2014-01-25 MED ORDER — ALBUTEROL SULFATE HFA 108 (90 BASE) MCG/ACT IN AERS: 2.0000 | INHALATION_SPRAY | RESPIRATORY_TRACT | Status: DC | PRN

## 2014-01-25 MED ORDER — BUDESONIDE-FORMOTEROL FUMARATE 160-4.5 MCG/ACT IN AERO: 2.0000 | INHALATION_SPRAY | Freq: Two times a day (BID) | RESPIRATORY_TRACT | Status: DC

## 2014-01-25 NOTE — Telephone Encounter (Signed)
Called pt. Aware RX's have been sent. Nothing further needed

## 2014-02-13 ENCOUNTER — Telehealth: Payer: Self-pay | Admitting: Pulmonary Disease

## 2014-02-13 MED ORDER — TIOTROPIUM BROMIDE MONOHYDRATE 2.5 MCG/ACT IN AERS: 2.0000 | INHALATION_SPRAY | Freq: Every day | RESPIRATORY_TRACT | Status: DC

## 2014-02-13 NOTE — Telephone Encounter (Signed)
Pt calling requesting Sp[facility] Respimat [facility] Nothing further needed.

## 2014-03-27 ENCOUNTER — Ambulatory Visit: Payer: Medicare Other | Admitting: Pulmonary Disease

## 2014-03-31 ENCOUNTER — Encounter: Payer: Self-pay | Admitting: *Deleted

## 2014-04-20 ENCOUNTER — Encounter: Payer: Self-pay | Admitting: Pulmonary Disease

## 2014-04-20 ENCOUNTER — Ambulatory Visit (INDEPENDENT_AMBULATORY_CARE_PROVIDER_SITE_OTHER): Payer: Medicare Other | Admitting: Pulmonary Disease

## 2014-04-20 VITALS — BP 150/80 | HR 78 | Temp 98.0°F | Ht 73.0 in | Wt 136.2 lb

## 2014-04-20 DIAGNOSIS — J9610 Chronic respiratory failure, unspecified whether with hypoxia or hypercapnia: Secondary | ICD-10-CM

## 2014-04-20 DIAGNOSIS — R0902 Hypoxemia: Secondary | ICD-10-CM

## 2014-04-20 DIAGNOSIS — Z23 Encounter for immunization: Secondary | ICD-10-CM

## 2014-04-20 DIAGNOSIS — J439 Emphysema, unspecified: Secondary | ICD-10-CM

## 2014-04-20 DIAGNOSIS — J438 Other emphysema: Secondary | ICD-10-CM

## 2014-04-20 DIAGNOSIS — J9611 Chronic respiratory failure with hypoxia: Secondary | ICD-10-CM

## 2014-04-20 NOTE — Patient Instructions (Signed)
Will give you the flu shot today. No change in your maintenance medications Ok to stay off prednisone for now. followup with me again in 4mos.

## 2014-04-20 NOTE — Progress Notes (Signed)
   Subjective:    Patient ID: Brendan Herring, male    DOB: 25-Sep-1940, 73 y.o.   MRN: [id number]  HPI The patient comes in today for followup of his known severe COPD, with chronic respiratory failure. He is maintaining on his bronchodilator regimen, and has weaned off prednisone. He feels that his breathing is at baseline, and has not had a recent exacerbation or chest infection.   Review of Systems  Constitutional: Negative for fever and unexpected weight change.  HENT: Negative for congestion, dental problem, ear pain, nosebleeds, postnasal drip, rhinorrhea, sinus pressure, sneezing, sore throat and trouble swallowing.   Eyes: Negative for redness and itching.  Respiratory: Positive for shortness of breath. Negative for cough, chest tightness and wheezing.   Cardiovascular: Negative for palpitations and leg swelling.  Gastrointestinal: Negative for nausea and vomiting.  Genitourinary: Negative for dysuria.  Musculoskeletal: Negative for joint swelling.  Skin: Negative for rash.  Neurological: Negative for headaches.  Hematological: Does not bruise/bleed easily.  Psychiatric/Behavioral: Negative for dysphoric mood. The patient is not nervous/anxious.        Objective:   Physical Exam Thin male in no acute distress Nose without purulence or discharge noted Neck without lymphadenopathy or thyromegaly Chest with very diminished breath sounds, no active wheezing Exam with regular rate and rhythm Lower extremities without edema, no cyanosis Alert and oriented, moves all 4 extremities.       Assessment & Plan:

## 2014-04-20 NOTE — Assessment & Plan Note (Signed)
The patient appears to be doing as well as can be expected given the severity of his lung disease. He has tapered off prednisone with no sequelae, and I've asked him to continue on his aggressive bronchodilator regimen. I've also asked him to stay as active as possible.

## 2014-07-31 ENCOUNTER — Other Ambulatory Visit: Payer: Self-pay | Admitting: Pulmonary Disease

## 2014-08-24 ENCOUNTER — Encounter: Payer: Self-pay | Admitting: Pulmonary Disease

## 2014-08-24 ENCOUNTER — Ambulatory Visit (INDEPENDENT_AMBULATORY_CARE_PROVIDER_SITE_OTHER): Payer: Medicare Other | Admitting: Pulmonary Disease

## 2014-08-24 VITALS — BP 138/76 | HR 103 | Temp 97.0°F | Ht 73.0 in | Wt 134.6 lb

## 2014-08-24 DIAGNOSIS — J9611 Chronic respiratory failure with hypoxia: Secondary | ICD-10-CM

## 2014-08-24 DIAGNOSIS — J438 Other emphysema: Secondary | ICD-10-CM

## 2014-08-24 NOTE — Progress Notes (Signed)
   Subjective:    Patient ID: Brendan Herring, male    DOB: 10/01/1940, 74 y.o.   MRN: [id number]  HPI The patient comes in today for follow-up of his known severe COPD with chronic respiratory failure. He has been fairly stable since the last, but doesn't feel quite as good as his normal self. He denies any chest congestion, cough, or purulence. He denies any lower extremity edema or chest discomfort.   Review of Systems  Constitutional: Negative for fever and unexpected weight change.  HENT: Positive for congestion and postnasal drip. Negative for dental problem, ear pain, nosebleeds, rhinorrhea, sinus pressure, sneezing, sore throat and trouble swallowing.   Eyes: Negative for redness and itching.  Respiratory: Positive for cough, chest tightness and shortness of breath. Negative for wheezing.   Cardiovascular: Negative for palpitations and leg swelling.  Gastrointestinal: Negative for nausea and vomiting.  Genitourinary: Negative for dysuria.  Musculoskeletal: Negative for joint swelling.  Skin: Negative for rash.  Neurological: Negative for headaches.  Hematological: Does not bruise/bleed easily.  Psychiatric/Behavioral: Negative for dysphoric mood. The patient is not nervous/anxious.        Objective:   Physical Exam Thin male in no acute distress Nose without purulence or discharge noted Neck without lymphadenopathy or thyromegaly Chest with very diminished breath sounds, no active wheezing Cardiac exam with regular rate and rhythm Lower extremities with no edema, no cyanosis Alert and oriented, moves all 4 extremities.       Assessment & Plan:

## 2014-08-24 NOTE — Assessment & Plan Note (Signed)
The patient feels that his breathing is a little below his usual baseline, but not excessively. He has not had any acute exacerbation, nor has he required increased rescue medication. He did wean himself off chronic prednisone in September of last year, and I've asked him to consider getting back on this if he is not satisfied with his exertional tolerance. Also stressed to him the importance of adequate nutrition and building muscle mass.

## 2014-08-24 NOTE — Patient Instructions (Signed)
No change in daily medications It is ok to start on low dose prednisone 5-10mg  if you feel your breathing is bothering you more followup with me again in 4mos

## 2014-10-10 ENCOUNTER — Telehealth: Payer: Self-pay | Admitting: Pulmonary Disease

## 2014-10-10 MED ORDER — CEFDINIR 300 MG PO CAPS: 600.0000 mg | ORAL_CAPSULE | Freq: Every morning | ORAL | Status: DC

## 2014-10-10 NOTE — Telephone Encounter (Signed)
Spoke with pt, c/o sore throat, sinus congestion X1 week that is settling in chest.  Prod cough with light yellow mucus.  Denies fever, sob.  [facility].  [facility] please advise.  Thanks!

## 2014-10-10 NOTE — Telephone Encounter (Signed)
Ok to call in Inwoodomnicef 300mg , take 2 each am for 5 days.

## 2014-10-10 NOTE — Telephone Encounter (Signed)
Pt aware of recs.  Med sent in.  Nothing further needed.  

## 2014-12-03 ENCOUNTER — Other Ambulatory Visit: Payer: Self-pay | Admitting: Pulmonary Disease

## 2014-12-28 ENCOUNTER — Ambulatory Visit (INDEPENDENT_AMBULATORY_CARE_PROVIDER_SITE_OTHER): Payer: Medicare Other | Admitting: Pulmonary Disease

## 2014-12-28 ENCOUNTER — Encounter: Payer: Self-pay | Admitting: Pulmonary Disease

## 2014-12-28 VITALS — BP 150/84 | HR 84 | Temp 98.2°F | Ht 73.0 in | Wt 135.0 lb

## 2014-12-28 DIAGNOSIS — J9611 Chronic respiratory failure with hypoxia: Secondary | ICD-10-CM

## 2014-12-28 DIAGNOSIS — J438 Other emphysema: Secondary | ICD-10-CM

## 2014-12-28 NOTE — Progress Notes (Signed)
   Subjective:    Patient ID: Brendan Herring L Batch, male    DOB: 08/12/1941, 74 y.o.   MRN: [id number]  HPI The patient comes in today for follow-up of his known severe COPD with chronic respiratory failure. He has not had a recent acute exacerbation, but did have an episode of acute bronchitis at the beginning of the year. He has been trying to stay as active as possible, and is staying compliant on his oxygen. He feels that his dyspnea on exertion is at baseline.   Review of Systems  Constitutional: Negative for fever and unexpected weight change.  HENT: Negative for congestion, dental problem, ear pain, nosebleeds, postnasal drip, rhinorrhea, sinus pressure, sneezing, sore throat and trouble swallowing.   Eyes: Negative for redness and itching.  Respiratory: Positive for chest tightness and shortness of breath. Negative for wheezing.   Cardiovascular: Negative for palpitations and leg swelling.  Gastrointestinal: Negative for nausea and vomiting.  Genitourinary: Negative for dysuria.  Musculoskeletal: Negative for joint swelling.  Skin: Negative for rash.  Neurological: Negative for headaches.  Hematological: Does not bruise/bleed easily.  Psychiatric/Behavioral: Negative for dysphoric mood. The patient is not nervous/anxious.        Objective:   Physical Exam Thin male in no acute distress Nose without purulence or discharge noted Neck without lymphadenopathy or thyromegaly Chest with very diminished breath sounds diffusely, no active wheezing Cardiac exam with distant heart sounds, but sounds regular Lower extremities without edema, no cyanosis Alert and oriented, moves all 4 extremities.       Assessment & Plan:

## 2014-12-28 NOTE — Assessment & Plan Note (Signed)
The patient has end-stage COPD, but continues to be more functional than I would expect given his degree of lung disease. He has significant dyspnea on exertion, but it least he is staying at his normal baseline. I've asked him to continue on his current medications, but to consider changing his maintenance bronchodilator to his nebulizer device.

## 2014-12-28 NOTE — Patient Instructions (Signed)
No change in breathing medications for now.  Think about changing your maintenance meds to nebulizer device. Stay as active as possible followup with Dr. [name] in 4mos.

## 2015-02-19 ENCOUNTER — Other Ambulatory Visit: Payer: Self-pay | Admitting: Pulmonary Disease

## 2015-02-21 ENCOUNTER — Other Ambulatory Visit: Payer: Self-pay | Admitting: *Deleted

## 2015-02-21 MED ORDER — ALBUTEROL SULFATE HFA 108 (90 BASE) MCG/ACT IN AERS: 2.0000 | INHALATION_SPRAY | RESPIRATORY_TRACT | Status: DC | PRN

## 2015-02-22 ENCOUNTER — Telehealth: Payer: Self-pay | Admitting: Pulmonary Disease

## 2015-02-22 MED ORDER — AMOXICILLIN-POT CLAVULANATE 875-125 MG PO TABS: 1.0000 | ORAL_TABLET | Freq: Two times a day (BID) | ORAL | Status: DC

## 2015-02-22 NOTE — Telephone Encounter (Signed)
Patient says he has a flare up, coughing up dark, graying green sputum.  Patient says Dr. Shelle Ironlance would just call in prescription for antibiotics for him.  He said that when he spoke to Dr. [name], he said he would do the same.  [facility].  Allergies  Allergen Reactions  . Benazepril Hcl     REACTION: sensitive to sunlight  . Levofloxacin     REACTION: tendonitis

## 2015-02-22 NOTE — Telephone Encounter (Signed)
Per SN: can call in augmentin #20 1 po BID and take OTC align while on ABX  Called made pt aware. RX sent in. Nothing further needed

## 2015-02-25 ENCOUNTER — Emergency Department (HOSPITAL_COMMUNITY): Payer: Medicare Other

## 2015-02-25 ENCOUNTER — Inpatient Hospital Stay (HOSPITAL_COMMUNITY)
Admission: EM | Admit: 2015-02-25 | Discharge: 2015-02-28 | DRG: 190 | Disposition: A | Discharge status: Home / Self Care | Payer: Medicare Other | Attending: Internal Medicine | Admitting: Internal Medicine

## 2015-02-25 ENCOUNTER — Encounter (HOSPITAL_COMMUNITY): Payer: Self-pay | Admitting: *Deleted

## 2015-02-25 DIAGNOSIS — T380X5A Adverse effect of glucocorticoids and synthetic analogues, initial encounter: Secondary | ICD-10-CM | POA: Diagnosis present

## 2015-02-25 DIAGNOSIS — Z9981 Dependence on supplemental oxygen: Secondary | ICD-10-CM | POA: Diagnosis not present

## 2015-02-25 DIAGNOSIS — Z66 Do not resuscitate: Secondary | ICD-10-CM | POA: Diagnosis present

## 2015-02-25 DIAGNOSIS — J9601 Acute respiratory failure with hypoxia: Secondary | ICD-10-CM

## 2015-02-25 DIAGNOSIS — H409 Unspecified glaucoma: Secondary | ICD-10-CM | POA: Diagnosis present

## 2015-02-25 DIAGNOSIS — R0602 Shortness of breath: Secondary | ICD-10-CM | POA: Diagnosis not present

## 2015-02-25 DIAGNOSIS — Z7982 Long term (current) use of aspirin: Secondary | ICD-10-CM | POA: Diagnosis not present

## 2015-02-25 DIAGNOSIS — E1165 Type 2 diabetes mellitus with hyperglycemia: Secondary | ICD-10-CM | POA: Diagnosis present

## 2015-02-25 DIAGNOSIS — J441 Chronic obstructive pulmonary disease with (acute) exacerbation: Secondary | ICD-10-CM | POA: Diagnosis present

## 2015-02-25 DIAGNOSIS — D539 Nutritional anemia, unspecified: Secondary | ICD-10-CM | POA: Diagnosis present

## 2015-02-25 DIAGNOSIS — I2510 Atherosclerotic heart disease of native coronary artery without angina pectoris: Secondary | ICD-10-CM | POA: Diagnosis present

## 2015-02-25 DIAGNOSIS — I10 Essential (primary) hypertension: Secondary | ICD-10-CM | POA: Diagnosis present

## 2015-02-25 DIAGNOSIS — J9621 Acute and chronic respiratory failure with hypoxia: Secondary | ICD-10-CM | POA: Diagnosis not present

## 2015-02-25 DIAGNOSIS — Z87891 Personal history of nicotine dependence: Secondary | ICD-10-CM

## 2015-02-25 DIAGNOSIS — Z515 Encounter for palliative care: Secondary | ICD-10-CM

## 2015-02-25 DIAGNOSIS — J439 Emphysema, unspecified: Secondary | ICD-10-CM | POA: Diagnosis not present

## 2015-02-25 DIAGNOSIS — J189 Pneumonia, unspecified organism: Secondary | ICD-10-CM | POA: Diagnosis not present

## 2015-02-25 DIAGNOSIS — E43 Unspecified severe protein-calorie malnutrition: Secondary | ICD-10-CM | POA: Diagnosis not present

## 2015-02-25 DIAGNOSIS — E780 Pure hypercholesterolemia: Secondary | ICD-10-CM | POA: Diagnosis present

## 2015-02-25 DIAGNOSIS — Z681 Body mass index (BMI) 19 or less, adult: Secondary | ICD-10-CM

## 2015-02-25 DIAGNOSIS — R739 Hyperglycemia, unspecified: Secondary | ICD-10-CM

## 2015-02-25 DIAGNOSIS — E039 Hypothyroidism, unspecified: Secondary | ICD-10-CM | POA: Diagnosis present

## 2015-02-25 LAB — CBC WITH DIFFERENTIAL/PLATELET
Basophils Absolute: 0.1 10*3/uL (ref 0.0–0.1)
Basophils Relative: 1 % (ref 0–1)
EOS ABS: 0.3 10*3/uL (ref 0.0–0.7)
Eosinophils Relative: 3 % (ref 0–5)
HCT: 41.4 % (ref 39.0–52.0)
Hemoglobin: 13.1 g/dL (ref 13.0–17.0)
LYMPHS ABS: 1 10*3/uL (ref 0.7–4.0)
Lymphocytes Relative: 11 % — ABNORMAL LOW (ref 12–46)
MCH: 33 pg (ref 26.0–34.0)
MCHC: 31.6 g/dL (ref 30.0–36.0)
MCV: 104.3 fL — ABNORMAL HIGH (ref 78.0–100.0)
MONO ABS: 1.5 10*3/uL — AB (ref 0.1–1.0)
Monocytes Relative: 17 % — ABNORMAL HIGH (ref 3–12)
NEUTROS ABS: 6 10*3/uL (ref 1.7–7.7)
NEUTROS PCT: 68 % (ref 43–77)
PLATELETS: 224 10*3/uL (ref 150–400)
RBC: 3.97 MIL/uL — ABNORMAL LOW (ref 4.22–5.81)
RDW: 12.3 % (ref 11.5–15.5)
WBC: 8.8 10*3/uL (ref 4.0–10.5)

## 2015-02-25 LAB — COMPREHENSIVE METABOLIC PANEL
ALT: 20 U/L (ref 17–63)
AST: 21 U/L (ref 15–41)
Albumin: 3 g/dL — ABNORMAL LOW (ref 3.5–5.0)
Alkaline Phosphatase: 61 U/L (ref 38–126)
Anion gap: 7 (ref 5–15)
BUN: 13 mg/dL (ref 6–20)
CHLORIDE: 94 mmol/L — AB (ref 101–111)
CO2: 35 mmol/L — AB (ref 22–32)
Calcium: 8.7 mg/dL — ABNORMAL LOW (ref 8.9–10.3)
Creatinine, Ser: 0.58 mg/dL — ABNORMAL LOW (ref 0.61–1.24)
GFR calc Af Amer: >60 mL/min (ref >60)
Glucose, Bld: 126 mg/dL — ABNORMAL HIGH (ref 65–99)
Potassium: 4.6 mmol/L (ref 3.5–5.1)
Sodium: 136 mmol/L (ref 135–145)
Total Bilirubin: 0.5 mg/dL (ref 0.3–1.2)
Total Protein: 6 g/dL — ABNORMAL LOW (ref 6.5–8.1)

## 2015-02-25 LAB — BRAIN NATRIURETIC PEPTIDE: B Natriuretic Peptide: 1424.3 pg/mL — ABNORMAL HIGH (ref 0.0–100.0)

## 2015-02-25 LAB — TSH: TSH: 1.302 u[IU]/mL (ref 0.350–4.500)

## 2015-02-25 LAB — TROPONIN I: Troponin I: <0.03 ng/mL (ref <0.031)

## 2015-02-25 LAB — GLUCOSE, CAPILLARY: Glucose-Capillary: 246 mg/dL — ABNORMAL HIGH (ref 65–99)

## 2015-02-25 MED ORDER — TIOTROPIUM BROMIDE MONOHYDRATE 18 MCG IN CAPS
18.0000 ug | ORAL_CAPSULE | Freq: Every day | RESPIRATORY_TRACT | Status: DC
  Administered 2015-02-26 – 2015-02-27 (×2): 18 ug via RESPIRATORY_TRACT
  Filled 2015-02-25: qty 5

## 2015-02-25 MED ORDER — ALBUTEROL SULFATE (2.5 MG/3ML) 0.083% IN NEBU
2.5000 mg | INHALATION_SOLUTION | Freq: Once | RESPIRATORY_TRACT | Status: AC
  Administered 2015-02-25: 2.5 mg via RESPIRATORY_TRACT
  Filled 2015-02-25: qty 3

## 2015-02-25 MED ORDER — IPRATROPIUM-ALBUTEROL 0.5-2.5 (3) MG/3ML IN SOLN
3.0000 mL | Freq: Once | RESPIRATORY_TRACT | Status: AC
  Administered 2015-02-25: 3 mL via RESPIRATORY_TRACT
  Filled 2015-02-25: qty 3

## 2015-02-25 MED ORDER — BRINZOLAMIDE 1 % OP SUSP
1.0000 [drp] | Freq: Three times a day (TID) | OPHTHALMIC | Status: DC
  Administered 2015-02-25 – 2015-02-28 (×7): 1 [drp] via OPHTHALMIC
  Filled 2015-02-25: qty 10

## 2015-02-25 MED ORDER — LISINOPRIL 40 MG PO TABS
40.0000 mg | ORAL_TABLET | Freq: Every day | ORAL | Status: DC
  Administered 2015-02-26 – 2015-02-28 (×3): 40 mg via ORAL
  Filled 2015-02-25 (×3): qty 1

## 2015-02-25 MED ORDER — DEXTROSE 5 % IV SOLN
500.0000 mg | Freq: Once | INTRAVENOUS | Status: DC
  Filled 2015-02-25: qty 500

## 2015-02-25 MED ORDER — INSULIN ASPART 100 UNIT/ML ~~LOC~~ SOLN
0.0000 [IU] | Freq: Three times a day (TID) | SUBCUTANEOUS | Status: DC
  Administered 2015-02-26: 3 [IU] via SUBCUTANEOUS
  Administered 2015-02-26: 5 [IU] via SUBCUTANEOUS
  Administered 2015-02-27 (×2): 3 [IU] via SUBCUTANEOUS
  Administered 2015-02-27: 2 [IU] via SUBCUTANEOUS
  Administered 2015-02-28: 3 [IU] via SUBCUTANEOUS
  Administered 2015-02-28: 5 [IU] via SUBCUTANEOUS

## 2015-02-25 MED ORDER — LATANOPROST 0.005 % OP SOLN
1.0000 [drp] | Freq: Every day | OPHTHALMIC | Status: DC
  Administered 2015-02-25 – 2015-02-27 (×3): 1 [drp] via OPHTHALMIC
  Filled 2015-02-25: qty 2.5

## 2015-02-25 MED ORDER — DEXTROSE 5 % IV SOLN
500.0000 mg | INTRAVENOUS | Status: DC
  Administered 2015-02-25 – 2015-02-27 (×3): 500 mg via INTRAVENOUS
  Filled 2015-02-25 (×4): qty 500

## 2015-02-25 MED ORDER — BUDESONIDE-FORMOTEROL FUMARATE 160-4.5 MCG/ACT IN AERO
2.0000 | INHALATION_SPRAY | Freq: Two times a day (BID) | RESPIRATORY_TRACT | Status: DC
  Administered 2015-02-25 – 2015-02-28 (×6): 2 via RESPIRATORY_TRACT
  Filled 2015-02-25: qty 6

## 2015-02-25 MED ORDER — FOLIC ACID 1 MG PO TABS
1.0000 mg | ORAL_TABLET | Freq: Every day | ORAL | Status: DC
  Administered 2015-02-25 – 2015-02-28 (×4): 1 mg via ORAL
  Filled 2015-02-25 (×4): qty 1

## 2015-02-25 MED ORDER — VITAMIN B-1 100 MG PO TABS
100.0000 mg | ORAL_TABLET | Freq: Every day | ORAL | Status: DC
  Administered 2015-02-25 – 2015-02-28 (×4): 100 mg via ORAL
  Filled 2015-02-25 (×4): qty 1

## 2015-02-25 MED ORDER — SODIUM CHLORIDE 0.9 % IJ SOLN
3.0000 mL | Freq: Two times a day (BID) | INTRAMUSCULAR | Status: DC
  Administered 2015-02-25 – 2015-02-28 (×3): 3 mL via INTRAVENOUS

## 2015-02-25 MED ORDER — ACETAMINOPHEN 325 MG PO TABS: 650.0000 mg | ORAL_TABLET | Freq: Four times a day (QID) | ORAL | Status: DC | PRN

## 2015-02-25 MED ORDER — DEXTROSE 5 % IV SOLN
1.0000 g | Freq: Once | INTRAVENOUS | Status: AC
  Administered 2015-02-25: 1 g via INTRAVENOUS
  Filled 2015-02-25: qty 10

## 2015-02-25 MED ORDER — CEFTRIAXONE SODIUM IN DEXTROSE 20 MG/ML IV SOLN
1.0000 g | INTRAVENOUS | Status: DC
  Administered 2015-02-26 – 2015-02-27 (×2): 1 g via INTRAVENOUS
  Filled 2015-02-25 (×3): qty 50

## 2015-02-25 MED ORDER — SODIUM CHLORIDE 0.9 % IV SOLN
INTRAVENOUS | Status: DC
  Administered 2015-02-25 – 2015-02-26 (×2): via INTRAVENOUS
  Administered 2015-02-27: 50 mL/h via INTRAVENOUS

## 2015-02-25 MED ORDER — DILTIAZEM HCL 60 MG PO TABS
120.0000 mg | ORAL_TABLET | Freq: Every day | ORAL | Status: DC
  Administered 2015-02-26 – 2015-02-28 (×3): 120 mg via ORAL
  Filled 2015-02-25 (×3): qty 2

## 2015-02-25 MED ORDER — LEVOTHYROXINE SODIUM 100 MCG PO TABS
100.0000 ug | ORAL_TABLET | Freq: Every day | ORAL | Status: DC
  Administered 2015-02-26 – 2015-02-28 (×3): 100 ug via ORAL
  Filled 2015-02-25 (×4): qty 1

## 2015-02-25 MED ORDER — IPRATROPIUM-ALBUTEROL 0.5-2.5 (3) MG/3ML IN SOLN
RESPIRATORY_TRACT | Status: AC
  Filled 2015-02-25: qty 3

## 2015-02-25 MED ORDER — METHYLPREDNISOLONE SODIUM SUCC 125 MG IJ SOLR
125.0000 mg | Freq: Once | INTRAMUSCULAR | Status: AC
  Administered 2015-02-25: 125 mg via INTRAVENOUS
  Filled 2015-02-25: qty 2

## 2015-02-25 MED ORDER — ONDANSETRON HCL 4 MG PO TABS: 4.0000 mg | ORAL_TABLET | Freq: Four times a day (QID) | ORAL | Status: DC | PRN

## 2015-02-25 MED ORDER — ALBUTEROL SULFATE (2.5 MG/3ML) 0.083% IN NEBU: 2.5000 mg | INHALATION_SOLUTION | Freq: Four times a day (QID) | RESPIRATORY_TRACT | Status: DC | PRN

## 2015-02-25 MED ORDER — ADULT MULTIVITAMIN W/MINERALS CH
1.0000 | ORAL_TABLET | Freq: Every day | ORAL | Status: DC
  Administered 2015-02-25 – 2015-02-28 (×4): 1 via ORAL
  Filled 2015-02-25 (×4): qty 1

## 2015-02-25 MED ORDER — ONDANSETRON HCL 4 MG/2ML IJ SOLN: 4.0000 mg | Freq: Four times a day (QID) | INTRAMUSCULAR | Status: DC | PRN

## 2015-02-25 MED ORDER — METHYLPREDNISOLONE SODIUM SUCC 125 MG IJ SOLR
60.0000 mg | Freq: Four times a day (QID) | INTRAMUSCULAR | Status: DC
  Administered 2015-02-25 – 2015-02-27 (×7): 60 mg via INTRAVENOUS
  Filled 2015-02-25 (×10): qty 0.96

## 2015-02-25 MED ORDER — ASPIRIN EC 81 MG PO TBEC
81.0000 mg | DELAYED_RELEASE_TABLET | Freq: Every day | ORAL | Status: DC
  Administered 2015-02-26 – 2015-02-28 (×3): 81 mg via ORAL
  Filled 2015-02-25 (×3): qty 1

## 2015-02-25 MED ORDER — ACETAMINOPHEN 650 MG RE SUPP: 650.0000 mg | Freq: Four times a day (QID) | RECTAL | Status: DC | PRN

## 2015-02-25 MED ORDER — HEPARIN SODIUM (PORCINE) 5000 UNIT/ML IJ SOLN
5000.0000 [IU] | Freq: Three times a day (TID) | INTRAMUSCULAR | Status: DC
  Administered 2015-02-26 – 2015-02-28 (×6): 5000 [IU] via SUBCUTANEOUS
  Filled 2015-02-25 (×10): qty 1

## 2015-02-25 NOTE — ED Provider Notes (Signed)
CSN: [id number]     Arrival date & time 02/25/15  1618 History   First MD Initiated Contact with Patient 02/25/15 1623     Chief Complaint  Patient presents with  . Shortness of Breath    HPI  Patient presents with dyspnea, fatigue. 5 days ago the patient developed his worsening dyspnea, mild cough. He spoke with his physician, started a course of amoxicillin. Since the time symptoms have been progressive, with increasing fatigue, dyspnea, inability to complete prior activities of daily living in a similar fashion. He is home oxygen dependent, notes no change in amounts. No fever at home, no nausea, vomiting, chest pain, syncope. No relief in spite of using amoxicillin, proper dilator. No clear exacerbating factors beyond exertion.   Past Medical History  Diagnosis Date  . Emphysema   . Hypertension   . Coronary artery disease   . Thyroid disease   . Glaucoma   . Hypercholesterolemia   . COPD (chronic obstructive pulmonary disease)    Past Surgical History  Procedure Laterality Date  . Back surgery  1982  . Cataract extraction  1990    bilateral  . Tonsillectomy  1994  . Wisdom tooth extraction  1960  . Vasectomy  1970  . Coronary angioplasty with stent placement     Family History  Problem Relation Age of Onset  . Emphysema Father   . Heart failure Father   . Stroke Father   . Heart disease Father   . Heart failure Brother   . Heart disease Mother    History  Substance Use Topics  . Smoking status: Former Smoker -- 1.00 packs/day for 35 years    Types: Cigarettes    Quit date: 08/17/1994  . Smokeless tobacco: Not on file  . Alcohol Use: No    Review of Systems  Constitutional:       Per HPI, otherwise negative  HENT:       Per HPI, otherwise negative  Respiratory:       Per HPI, otherwise negative  Cardiovascular:       Per HPI, otherwise negative  Gastrointestinal: Negative for vomiting.  Endocrine:       Negative aside from HPI  Genitourinary:        Neg aside from HPI   Musculoskeletal:       Per HPI, otherwise negative  Skin: Negative.   Allergic/Immunologic: Negative for immunocompromised state.  Neurological: Positive for weakness. Negative for syncope.      Allergies  Benazepril hcl and Levofloxacin  Home Medications   Prior to Admission medications   Medication Sig Start Date End Date Taking? Authorizing Provider  albuterol (PROAIR HFA) 108 (90 BASE) MCG/ACT inhaler Inhale 2 puffs into the lungs every 4 (four) hours as needed. 02/21/15  Yes Michele Mcalpine, MD  albuterol (PROVENTIL) (2.5 MG/3ML) 0.083% nebulizer solution Take 2.5 mg by nebulization 4 (four) times daily as needed.     Yes Historical Provider, MD  amoxicillin-clavulanate (AUGMENTIN) 875-125 MG per tablet Take 1 tablet by mouth 2 (two) times daily. Patient taking differently: Take 1 tablet by mouth 2 (two) times daily. Started medication on 02-21-15 02/22/15  Yes Michele Mcalpine, MD  aspirin 81 MG tablet Take 81 mg by mouth daily.     Yes Historical Provider, MD  bimatoprost (LUMIGAN) 0.03 % ophthalmic solution Place 1 drop into both eyes at bedtime.   Yes Historical Provider, MD  brinzolamide (AZOPT) 1 % ophthalmic suspension Place 1 drop into both  eyes 3 (three) times daily.   Yes Historical Provider, MD  Calcium Carbonate-Vitamin D (CALCIUM 600-D) 600-400 MG-UNIT per tablet Take 1 tablet by mouth daily.    Yes Historical Provider, MD  cholecalciferol (VITAMIN D) 1000 UNITS tablet Take 1,000 Units by mouth daily.   Yes Historical Provider, MD  diltiazem (CARDIZEM) 120 MG tablet Take 120 mg by mouth daily.     Yes Historical Provider, MD  levothyroxine (SYNTHROID, LEVOTHROID) 100 MCG tablet Take 100 mcg by mouth daily.     Yes Historical Provider, MD  lisinopril (PRINIVIL,ZESTRIL) 20 MG tablet Take 40 mg by mouth daily.    Yes Historical Provider, MD  Probiotic Product (PROBIOTIC PO) Take 1 tablet by mouth daily.   Yes Historical Provider, MD  SPIRIVA RESPIMAT 2.5  MCG/ACT AERS INHALE 2 PUFFS ONCE DAILY AS DIRECTED. 12/03/14  Yes Barbaraann Share, MD  SYMBICORT 160-4.5 MCG/ACT inhaler INHALE 2 PUFFS TWICE DAILY 07/31/14  Yes Barbaraann Share, MD  NON FORMULARY Oxygen 3.5p-4 Liters daily.    Historical Provider, MD   BP 180/82 mmHg  Pulse 103  Temp(Src) 98.3 F (36.8 C) (Oral)  Resp 20  Ht  (1.854 m)  Wt 132 lb (59.875 kg)  BMI 17.42 kg/m2  SpO2 99% Physical Exam  Constitutional: He is oriented to person, place, and time. He appears well-developed. He appears distressed.  Thin elderly male with pursed lip breathing, but awake and alert.  HENT:  Head: Normocephalic and atraumatic.  Eyes: Conjunctivae and EOM are normal.  Cardiovascular: Regular rhythm.  Tachycardia present.   Pulmonary/Chest: Accessory muscle usage present. No stridor. Tachypnea noted. He is in respiratory distress. He has decreased breath sounds. He has no wheezes.  Abdominal: He exhibits no distension.  Musculoskeletal: He exhibits no edema.  Neurological: He is alert and oriented to person, place, and time.  Skin: Skin is warm. He is diaphoretic.  Psychiatric: He has a normal mood and affect.  Nursing note and vitals reviewed.   ED Course  Procedures (including critical care time) Labs Review Labs Reviewed  COMPREHENSIVE METABOLIC PANEL - Abnormal; Notable for the following:    Chloride 94 (*)    CO2 35 (*)    Glucose, Bld 126 (*)    Creatinine, Ser 0.58 (*)    Calcium 8.7 (*)    Total Protein 6.0 (*)    Albumin 3.0 (*)    All other components within normal limits  CBC WITH DIFFERENTIAL/PLATELET - Abnormal; Notable for the following:    RBC 3.97 (*)    MCV 104.3 (*)    Lymphocytes Relative 11 (*)    Monocytes Relative 17 (*)    Monocytes Absolute 1.5 (*)    All other components within normal limits  BRAIN NATRIURETIC PEPTIDE - Abnormal; Notable for the following:    B Natriuretic Peptide 1424.3 (*)    All other components within normal limits  TROPONIN I     Imaging Review Dg Chest 2 View  02/25/2015   CLINICAL DATA:  Shortness of breath and extreme weakness for 1 week. Recently started antibiotics for bronchitis. Initial encounter.  EXAM: CHEST  2 VIEW  COMPARISON:  09/13/2013 and 10/26/2009.  CT 10/25/2009.  FINDINGS: The heart size and mediastinal contours are stable with aortic atherosclerosis. The lungs are markedly hyperinflated. Superimposed patchy airspace opacities posteriorly at both lung bases on the lateral view difficult to exclude. These are not well seen on the frontal examination. There is no significant pleural effusion or edema.  The bones appear unchanged.  IMPRESSION: Severe chronic obstructive lung disease. Cannot exclude superimposed patchy opacities at both lung bases. Followup PA and lateral chest X-ray is recommended in 3-4 weeks following trial of antibiotic therapy to ensure resolution and exclude underlying malignancy.   Electronically Signed   By: Carey BullocksWilliam  Veazey M.D.   On: 02/25/2015 18:21   I reviewed the x-ray, agree with the interpretation.   EKG Interpretation   Date/Time:  Monday February 25 2015 16:26:13 EDT Ventricular Rate:  103 PR Interval:  73 QRS Duration: 71 QT Interval:  332 QTC Calculation: 434 R Axis:   82 Text Interpretation:  Sinus or ectopic atrial tachycardia Ventricular  premature complex Borderline right axis deviation narrow complex  tachycardia w substantial artefact.  SR versus ectopic origin Premature  ventricular complexes Abnormal ekg Confirmed by Gerhard MunchLOCKWOOD, Lya Holben  MD  [phone]([id number]) on 02/25/2015 4:43:19 PM     Pulse oximetry 98% with supplemental oxygen abnormal Cardiac 105 sinus tachycardia, abnormal  I reviewed the patient's chart, including recent outpatient physician discussion of changing from inhaled medication to nebulizer therapy, initiation of medication for bronchitis.   Peak flow prior to nebulizer is 150.  After first nebulizer treatment the patient is moving more air, though he  has audible wheezing.  After second albuterol session, patient appears slightly better. He remains tachypneic, tachycardic.   MDM  Patient presents with ongoing dyspnea.  Patient has progressive COPD, and has not improved in spite of home therapy, including antibiotics. Patient is notably diminished on initial exam, though he has substantial improvement in his respiratory function here. X-ray suggests pneumonia, given the patient's comorbidities, patient received antibiotics, IV, was admitted for further evaluation, management.   Gerhard Munchobert Raymund Manrique, MD 02/25/15 Barry Brunner1935

## 2015-02-25 NOTE — ED Notes (Signed)
Attempted to call report. Floor RN unable to accept report.  

## 2015-02-25 NOTE — Progress Notes (Deleted)
ANTIBIOTIC CONSULT NOTE - INITIAL  Pharmacy Consult for levaquin Indication: rule out pneumonia  Allergies  Allergen Reactions  . Benazepril Hcl     REACTION: sensitive to sunlight  . Levofloxacin     REACTION: tendonitis    Patient Measurements: Height: 6\' 1"  (185.4 cm) Weight: 132 lb (59.875 kg) IBW/kg (Calculated) : 79.9  Vital Signs: Temp: 98.3 F (36.8 C) (07/11 1629) Temp Source: Oral (07/11 1629) BP: 161/74 mmHg (07/11 2045) Pulse Rate: 107 (07/11 2045) Intake/Output from previous day:   Intake/Output from this shift: Total I/O In: 100 [I.V.:100] Out: -   Labs:  Recent Labs  02/25/15 1653  WBC 8.8  HGB 13.1  PLT 224  CREATININE 0.58*   Estimated Creatinine Clearance: 66.6 mL/min (by C-G formula based on Cr of 0.58). No results for input(s): VANCOTROUGH, VANCOPEAK, VANCORANDOM, GENTTROUGH, GENTPEAK, GENTRANDOM, TOBRATROUGH, TOBRAPEAK, TOBRARND, AMIKACINPEAK, AMIKACINTROU, AMIKACIN in the last 72 hours.   Microbiology: No results found for this or any previous visit (from the past 720 hour(s)).  Medical History: Past Medical History  Diagnosis Date  . Emphysema   . Hypertension   . Coronary artery disease   . Thyroid disease   . Glaucoma   . Hypercholesterolemia   . COPD (chronic obstructive pulmonary disease)     Medications:  Prescriptions prior to admission  Medication Sig Dispense Refill Last Dose  . albuterol (PROAIR HFA) 108 (90 BASE) MCG/ACT inhaler Inhale 2 puffs into the lungs every 4 (four) hours as needed. (Patient taking differently: Inhale 2 puffs into the lungs every 4 (four) hours as needed for shortness of breath. ) 8.5 g 2 02/25/2015 at Unknown time  . albuterol (PROVENTIL) (2.5 MG/3ML) 0.083% nebulizer solution Take 2.5 mg by nebulization 4 (four) times daily as needed for shortness of breath.    02/25/2015 at Unknown time  . amoxicillin-clavulanate (AUGMENTIN) 875-125 MG per tablet Take 1 tablet by mouth 2 (two) times daily.  (Patient taking differently: Take 1 tablet by mouth 2 (two) times daily. Started medication on 02-21-15) 20 tablet 0 02/25/2015 at Unknown time  . aspirin 81 MG tablet Take 81 mg by mouth daily.     02/25/2015 at Unknown time  . bimatoprost (LUMIGAN) 0.03 % ophthalmic solution Place 1 drop into both eyes at bedtime.   Past Week at Unknown time  . brinzolamide (AZOPT) 1 % ophthalmic suspension Place 1 drop into both eyes 3 (three) times daily.   Past Week at Unknown time  . Calcium Carbonate-Vitamin D (CALCIUM 600-D) 600-400 MG-UNIT per tablet Take 1 tablet by mouth daily.    02/25/2015 at Unknown time  . cholecalciferol (VITAMIN D) 1000 UNITS tablet Take 1,000 Units by mouth daily.   02/25/2015 at Unknown time  . diltiazem (CARDIZEM) 120 MG tablet Take 120 mg by mouth daily.     02/25/2015 at Unknown time  . levothyroxine (SYNTHROID, LEVOTHROID) 100 MCG tablet Take 100 mcg by mouth daily.     02/25/2015 at Unknown time  . lisinopril (PRINIVIL,ZESTRIL) 20 MG tablet Take 40 mg by mouth daily.    02/25/2015 at Unknown time  . Probiotic Product (PROBIOTIC PO) Take 1 tablet by mouth daily.   02/24/2015 at Unknown time  . SPIRIVA RESPIMAT 2.5 MCG/ACT AERS INHALE 2 PUFFS ONCE DAILY AS DIRECTED. 4 g 2 02/25/2015 at Unknown time  . SYMBICORT 160-4.5 MCG/ACT inhaler INHALE 2 PUFFS TWICE DAILY 10.2 g 6 02/25/2015 at Unknown time  . NON FORMULARY Oxygen 3.5p-4 Liters daily.   Taking  Assessment: 74 yo man to start levaquin for acute exacerbation COPD.  CrCl ~60 ml/min.    Goal of Therapy:  Eradication of infection  Plan:  Levaquin 750 mg IV q24 hours F/u culture results, clinical course and renal function  Talbert Cage Poteet 02/25/2015,9:23 PM

## 2015-02-25 NOTE — Progress Notes (Signed)
Initiated RT Wheeze Protocol. Pt given Peak Flow with best of 3 attempts at 150. 5 mg Albuterol, 1 Atrovent ordered and will check peak flow post treatment for follow up. Per pt;s daughter, Pt purse lip breathes on a regular basis and is normal for him. Pt wears 5L [place] at home 24/7. Pt has increased WOB but pt's daughter stated it does not appear to be any more so than normal. Pt called daughter yesterday to ask for help with ADL's, which is abnormal for him. RT will continue to monitor.

## 2015-02-25 NOTE — ED Notes (Signed)
Pt is NOT a DNR. Tried to change it in screenings.

## 2015-02-25 NOTE — ED Notes (Signed)
Pt called his PCP last Wednesday and told him that he was having a episode of Bronchitis, the PCP called him in a prescription of some kind of antibiotic.  Pt has COPD and has stated that he has been very SOB and extremely week X 1 week.  VS are as follows: BP: 188/100 HR: 104 Resp: 24 O2sat 98% on 6L

## 2015-02-25 NOTE — H&P (Signed)
[facility] History and Physical  Brendan Herring BJY:[id number] DOB: 01/08/1941 DOA: 02/25/2015  Referring physician: Gerhard Munchobert Lockwood, MD PCP:  Duane Lopeoss, Alan, MD   Chief Complaint: Shortness of Breath  HPI: Brendan Herring is a 74 y.o. male with history of COPD presents with increased shortness of breath. Patient has been ill for about 5 days. It started off as increased shortness of breath and had some cough noted. Patient had some sputum production also. Patient was given Amoxil per his PCP. Patient now notes no improvement has some color to his sputum which is dark and green. No hemoptysis is noted. He admits wheeze. He has no fever noted. He has felt warm. He is on oxygen at home and has required an increase in the flow rate. There has been no chest pain. Patient has no fever noted. States currently not smoking.   Review of Systems:  12 point ROS performed and is unremarkable other than HPI  Past Medical History  Diagnosis Date  . Emphysema   . Hypertension   . Coronary artery disease   . Thyroid disease   . Glaucoma   . Hypercholesterolemia   . COPD (chronic obstructive pulmonary disease)    Past Surgical History  Procedure Laterality Date  . Back surgery  1982  . Cataract extraction  1990    bilateral  . Tonsillectomy  1994  . Wisdom tooth extraction  1960  . Vasectomy  1970  . Coronary angioplasty with stent placement     Social History:  reports that he quit smoking about 20 years ago. His smoking use included Cigarettes. He has a 35 pack-year smoking history. He does not have any smokeless tobacco history on file. He reports that he does not drink alcohol or use illicit drugs.  Allergies  Allergen Reactions  . Benazepril Hcl     REACTION: sensitive to sunlight  . Levofloxacin     REACTION: tendonitis    Family History  Problem Relation Age of Onset  . Emphysema Father   . Heart failure Father   . Stroke Father   . Heart disease Father   . Heart failure  Brother   . Heart disease Mother     Prior to Admission medications   Medication Sig Start Date End Date Taking? Authorizing Provider  albuterol (PROAIR HFA) 108 (90 BASE) MCG/ACT inhaler Inhale 2 puffs into the lungs every 4 (four) hours as needed. Patient taking differently: Inhale 2 puffs into the lungs every 4 (four) hours as needed for shortness of breath.  02/21/15  Yes Michele McalpineScott M Nadel, MD  albuterol (PROVENTIL) (2.5 MG/3ML) 0.083% nebulizer solution Take 2.5 mg by nebulization 4 (four) times daily as needed for shortness of breath.    Yes Historical Provider, MD  amoxicillin-clavulanate (AUGMENTIN) 875-125 MG per tablet Take 1 tablet by mouth 2 (two) times daily. Patient taking differently: Take 1 tablet by mouth 2 (two) times daily. Started medication on 02-21-15 02/22/15  Yes Michele McalpineScott M Nadel, MD  aspirin 81 MG tablet Take 81 mg by mouth daily.     Yes Historical Provider, MD  bimatoprost (LUMIGAN) 0.03 % ophthalmic solution Place 1 drop into both eyes at bedtime.   Yes Historical Provider, MD  brinzolamide (AZOPT) 1 % ophthalmic suspension Place 1 drop into both eyes 3 (three) times daily.   Yes Historical Provider, MD  Calcium Carbonate-Vitamin D (CALCIUM 600-D) 600-400 MG-UNIT per tablet Take 1 tablet by mouth daily.    Yes Historical Provider, MD  cholecalciferol (  VITAMIN D) 1000 UNITS tablet Take 1,000 Units by mouth daily.   Yes Historical Provider, MD  diltiazem (CARDIZEM) 120 MG tablet Take 120 mg by mouth daily.     Yes Historical Provider, MD  levothyroxine (SYNTHROID, LEVOTHROID) 100 MCG tablet Take 100 mcg by mouth daily.     Yes Historical Provider, MD  lisinopril (PRINIVIL,ZESTRIL) 20 MG tablet Take 40 mg by mouth daily.    Yes Historical Provider, MD  Probiotic Product (PROBIOTIC PO) Take 1 tablet by mouth daily.   Yes Historical Provider, MD  SPIRIVA RESPIMAT 2.5 MCG/ACT AERS INHALE 2 PUFFS ONCE DAILY AS DIRECTED. 12/03/14  Yes Barbaraann Share, MD  SYMBICORT 160-4.5 MCG/ACT inhaler  INHALE 2 PUFFS TWICE DAILY 07/31/14  Yes Barbaraann Share, MD  NON FORMULARY Oxygen 3.5p-4 Liters daily.    Historical Provider, MD   Physical Exam: Filed Vitals:   02/25/15 1837 02/25/15 1845 02/25/15 1900 02/25/15 1945  BP:  182/80 165/74 168/72  Pulse:  99 100 102  Temp:      TempSrc:      Resp:  Height:      Weight:      SpO2: 100% 100% 99% 99%    Wt Readings from Last 3 Encounters:  02/25/15 59.875 kg (132 lb)  12/28/14 61.236 kg (135 lb)  08/24/14 61.054 kg (134 lb 9.6 oz)    General:  Appears calm and comfortable Eyes: PERRL, normal lids, irises & conjunctiva ENT: grossly normal hearing, lips & tongue Neck: no LAD, masses or thyromegaly Cardiovascular: RRR, no m/r/g. No LE edema. Telemetry: SR, no arrhythmias  Respiratory: CTA bilaterally, no w/r/r. Normal respiratory effort. Abdomen: soft, ntnd Skin: no rash or induration seen on limited exam Musculoskeletal: grossly normal tone BUE/BLE Psychiatric: grossly normal mood and affect, speech fluent and appropriate Neurologic: grossly non-focal.          Labs on Admission:  Basic Metabolic Panel:  Recent Labs Lab 02/25/15 1653  NA 136  K 4.6  CL 94*  CO2 35*  GLUCOSE 126*  BUN 13  CREATININE 0.58*  CALCIUM 8.7*   Liver Function Tests:  Recent Labs Lab 02/25/15 1653  AST 21  ALT 20  ALKPHOS 61  BILITOT 0.5  PROT 6.0*  ALBUMIN 3.0*   No results for input(s): LIPASE, AMYLASE in the last 168 hours. No results for input(s): AMMONIA in the last 168 hours. CBC:  Recent Labs Lab 02/25/15 1653  WBC 8.8  NEUTROABS 6.0  HGB 13.1  HCT 41.4  MCV 104.3*  PLT 224   Cardiac Enzymes:  Recent Labs Lab 02/25/15 1653  TROPONINI <0.03    BNP (last 3 results)  Recent Labs  02/25/15 1653  BNP 1424.3*    ProBNP (last 3 results) No results for input(s): PROBNP in the last 8760 hours.  CBG: No results for input(s): GLUCAP in the last 168 hours.  Radiological Exams on Admission: Dg  Chest 2 View  02/25/2015   CLINICAL DATA:  Shortness of breath and extreme weakness for 1 week. Recently started antibiotics for bronchitis. Initial encounter.  EXAM: CHEST  2 VIEW  COMPARISON:  09/13/2013 and 10/26/2009.  CT 10/25/2009.  FINDINGS: The heart size and mediastinal contours are stable with aortic atherosclerosis. The lungs are markedly hyperinflated. Superimposed patchy airspace opacities posteriorly at both lung bases on the lateral view difficult to exclude. These are not well seen on the frontal examination. There is no significant pleural effusion or edema. The bones appear  unchanged.  IMPRESSION: Severe chronic obstructive lung disease. Cannot exclude superimposed patchy opacities at both lung bases. Followup PA and lateral chest X-ray is recommended in 3-4 weeks following trial of antibiotic therapy to ensure resolution and exclude underlying malignancy.   Electronically Signed   By: Carey Bullocks M.D.   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   Steroid-induced hyperglycemia   COPD exacerbation   1. Acute respiratory failure with Hypoxia -admit to telemetry -titrate oxygen as needed -started on IV steroids -will start on antibiotics for possible Pneumonia  2. COPD with Emphysema with acute exacerbation -will start on IV steroids -started on empiric antibiotics -continue with symbicort -Albuterol nebs q6h -continue spiriva  3. Possible bilateral basilar pneumonia -will start on empiric antibiotics -will need a follow up CXR -check sputum cultures  4. Steroid Induced Hyperglycemia -monitor FSBS -SSI as needed  5. Hypertension -will be continued on Cardizem -continue lisinopril -monitor pressures (elevated in the ED as he has not taken his medications this evening)  6.  Hypothyroid -continue synthroid -check TSH   Code Status: Full Code (must indicate code status--if unknown or must be presumed, indicate so) DVT Prophylaxis:Heparin Family Communication: none (indicate person spoken with, if applicable, with phone number if by telephone) Disposition Plan: Home (indicate anticipated LOS)  Time spent:  [facility] Pager [phone]

## 2015-02-26 DIAGNOSIS — Z515 Encounter for palliative care: Secondary | ICD-10-CM

## 2015-02-26 DIAGNOSIS — D539 Nutritional anemia, unspecified: Secondary | ICD-10-CM

## 2015-02-26 DIAGNOSIS — R0602 Shortness of breath: Secondary | ICD-10-CM

## 2015-02-26 LAB — COMPREHENSIVE METABOLIC PANEL
ALBUMIN: 2.9 g/dL — AB (ref 3.5–5.0)
ALK PHOS: 57 U/L (ref 38–126)
ALT: 20 U/L (ref 17–63)
AST: 19 U/L (ref 15–41)
Anion gap: 9 (ref 5–15)
BILIRUBIN TOTAL: 0.2 mg/dL — AB (ref 0.3–1.2)
BUN: 11 mg/dL (ref 6–20)
CALCIUM: 9 mg/dL (ref 8.9–10.3)
CO2: 35 mmol/L — ABNORMAL HIGH (ref 22–32)
Chloride: 94 mmol/L — ABNORMAL LOW (ref 101–111)
Creatinine, Ser: 0.69 mg/dL (ref 0.61–1.24)
GFR calc Af Amer: >60 mL/min (ref >60)
Glucose, Bld: 237 mg/dL — ABNORMAL HIGH (ref 65–99)
Potassium: 4.4 mmol/L (ref 3.5–5.1)
Sodium: 138 mmol/L (ref 135–145)
TOTAL PROTEIN: 6.2 g/dL — AB (ref 6.5–8.1)

## 2015-02-26 LAB — GLUCOSE, CAPILLARY
GLUCOSE-CAPILLARY: 226 mg/dL — AB (ref 65–99)
GLUCOSE-CAPILLARY: 108 mg/dL — AB (ref 65–99)
Glucose-Capillary: 184 mg/dL — ABNORMAL HIGH (ref 65–99)
Glucose-Capillary: 172 mg/dL — ABNORMAL HIGH (ref 65–99)
Glucose-Capillary: 171 mg/dL — ABNORMAL HIGH (ref 65–99)

## 2015-02-26 LAB — CBC
HEMATOCRIT: 41.3 % (ref 39.0–52.0)
Hemoglobin: 13 g/dL (ref 13.0–17.0)
MCH: 32.7 pg (ref 26.0–34.0)
MCHC: 31.5 g/dL (ref 30.0–36.0)
MCV: 104 fL — AB (ref 78.0–100.0)
PLATELETS: 233 10*3/uL (ref 150–400)
RBC: 3.97 MIL/uL — AB (ref 4.22–5.81)
RDW: 12 % (ref 11.5–15.5)
WBC: 5.7 10*3/uL (ref 4.0–10.5)

## 2015-02-26 LAB — VITAMIN B12: VITAMIN B 12: 625 pg/mL (ref 180–914)

## 2015-02-26 MED ORDER — INSULIN ASPART 100 UNIT/ML ~~LOC~~ SOLN
0.0000 [IU] | Freq: Three times a day (TID) | SUBCUTANEOUS | Status: DC
  Administered 2015-02-26: 3 [IU] via SUBCUTANEOUS

## 2015-02-26 MED ORDER — INSULIN ASPART 100 UNIT/ML ~~LOC~~ SOLN: 0.0000 [IU] | Freq: Every day | SUBCUTANEOUS | Status: DC

## 2015-02-26 MED ORDER — INSULIN ASPART 100 UNIT/ML ~~LOC~~ SOLN
3.0000 [IU] | Freq: Three times a day (TID) | SUBCUTANEOUS | Status: DC
  Administered 2015-02-26 – 2015-02-28 (×7): 3 [IU] via SUBCUTANEOUS

## 2015-02-26 MED ORDER — INSULIN DETEMIR 100 UNIT/ML ~~LOC~~ SOLN
10.0000 [IU] | Freq: Every day | SUBCUTANEOUS | Status: DC
  Administered 2015-02-26 – 2015-02-27 (×2): 10 [IU] via SUBCUTANEOUS
  Filled 2015-02-26 (×3): qty 0.1

## 2015-02-26 NOTE — Progress Notes (Signed)
[facility] PROGRESS NOTE Assessment/Plan: Acute respiratory failure with hypoxemia   COPD exacerbation - Started on IV steroids, antibiotics for possible pneumonia and inhalers. - Continue Rocephin and azithromycin. - We'll continue current therapy patient has agreed to meet with palliative care for goals of care.  Steroid-induced hyperglycemia - Continue long-acting insulin plus sliding-scale insulin.  Essential hypertension - Blood pressure slowly improving we'll continue current regimen.    Macrocytic anemia: TSH with a normal to the B-12 and RBC folate.   Code Status: full Family Communication: none  Disposition Plan: home 3-4 days   Consultants:  none  Procedures:  CXR  Antibiotics:  Rocephin and azithro  HPI/Subjective: he is short of breath and tired after eating. Objective: Filed Vitals:   02/25/15 2045 02/25/15 2126 02/25/15 2154 02/26/15 0535  BP: 161/74 165/78  153/81  Pulse: 107 106  95  Temp:  98.4 F (36.9 C)  98 F (36.7 C)  TempSrc:  Oral  Oral  Resp: 14   18  Height:      Weight:  59.421 kg (131 lb)    SpO2: 97% 99% 98% 95%    Intake/Output Summary (Last 24 hours) at 02/26/15 0808 Last data filed at 02/26/15 0721  Gross per 24 hour  Intake    100 ml  Output    875 ml  Net   -775 ml   Filed Weights   02/25/15 1629 02/25/15 2126  Weight: 59.875 kg (132 lb) 59.421 kg (131 lb)    Exam:  General: Alert, awake, oriented x3, in no acute distress.  HEENT: No bruits, no goiter.  Heart: Regular rate and rhythm. Lungs: moderate air movement with dry crackles. Abdomen: Soft, nontender, nondistended, positive bowel sounds.  Neuro: Grossly intact, nonfocal.   Data Reviewed: Basic Metabolic Panel:  Recent Labs Lab 02/25/15 1653 02/26/15 0320  NA 136 138  K 4.6 4.4  CL 94* 94*  CO2 35* 35*  GLUCOSE 126* 237*  BUN 13 11  CREATININE 0.58* 0.69  CALCIUM 8.7* 9.0   Liver Function Tests:  Recent Labs Lab  02/25/15 1653 02/26/15 0320  AST 21 19  ALT 20 20  ALKPHOS 61 57  BILITOT 0.5 0.2*  PROT 6.0* 6.2*  ALBUMIN 3.0* 2.9*   No results for input(s): LIPASE, AMYLASE in the last 168 hours. No results for input(s): AMMONIA in the last 168 hours. CBC:  Recent Labs Lab 02/25/15 1653 02/26/15 0320  WBC 8.8 5.7  NEUTROABS 6.0  --   HGB 13.1 13.0  HCT 41.4 41.3  MCV 104.3* 104.0*  PLT 224 233   Cardiac Enzymes:  Recent Labs Lab 02/25/15 1653  TROPONINI <0.03   BNP (last 3 results)  Recent Labs  02/25/15 1653  BNP 1424.3*    ProBNP (last 3 results) No results for input(s): PROBNP in the last 8760 hours.  CBG:  Recent Labs Lab 02/25/15 2123 02/26/15 0604  GLUCAP 246* 184*    No results found for this or any previous visit (from the past 240 hour(s)).   Studies: Dg Chest 2 View  02/25/2015   CLINICAL DATA:  Shortness of breath and extreme weakness for 1 week. Recently started antibiotics for bronchitis. Initial encounter.  EXAM: CHEST  2 VIEW  COMPARISON:  09/13/2013 and 10/26/2009.  CT 10/25/2009.  FINDINGS: The heart size and mediastinal contours are stable with aortic atherosclerosis. The lungs are markedly hyperinflated. Superimposed patchy airspace opacities posteriorly at both lung bases on the lateral view difficult to exclude.  These are not well seen on the frontal examination. There is no significant pleural effusion or edema. The bones appear unchanged.  IMPRESSION: Severe chronic obstructive lung disease. Cannot exclude superimposed patchy opacities at both lung bases. Followup PA and lateral chest X-ray is recommended in 3-4 weeks following trial of antibiotic therapy to ensure resolution and exclude underlying malignancy.   Electronically Signed   By: Carey Bullocks M.D.   On: 02/25/2015 18:21    Scheduled Meds: . aspirin EC  81 mg Oral Daily  . azithromycin  500 mg Intravenous Q24H  . brinzolamide  1 drop Both Eyes TID  . budesonide-formoterol  2 puff  Inhalation BID  . cefTRIAXone (ROCEPHIN)  IV  1 g Intravenous Q24H  . diltiazem  120 mg Oral Daily  . folic acid  1 mg Oral Daily  . heparin  5,000 Units Subcutaneous 3 times per day  . insulin aspart  0-15 Units Subcutaneous TID WC  . latanoprost  1 drop Both Eyes QHS  . levothyroxine  100 mcg Oral Daily  . lisinopril  40 mg Oral Daily  . methylPREDNISolone (SOLU-MEDROL) injection  60 mg Intravenous Q6H  . multivitamin with minerals  1 tablet Oral Daily  . sodium chloride  3 mL Intravenous Q12H  . thiamine  100 mg Oral Daily  . tiotropium  18 mcg Inhalation Daily   Continuous Infusions: . sodium chloride 50 mL/hr at 02/25/15 2130    Time Spent: 25 min.   Marinda Elk  [facility] Pager [phone]. If 7PM-7AM, please contact night-coverage at [email or web address], password [facility] 02/26/2015, 8:08 AM  LOS: 1 day

## 2015-02-26 NOTE — Consult Note (Signed)
Consultation Note Date: 02/26/2015   Patient Name: Brendan Herring  DOB: 04/20/1941  MRN: [id number]  Age / Sex: 74 y.o., male   PCP: Brendan Crankerharles Ross, MD Referring Physician: Marinda ElkAbraham Feliz Ortiz, MD  Reason for Consultation: Establishing goals of care  Palliative Care Assessment and Plan Summary of Established Goals of Care and Medical Treatment Preferences    Palliative Care Discussion Held Today:   I had a long discussion with Mr. Brendan Herring today. He very much understands his disease trajectory and tells me that he had a bad exacerbation 2011 and was given 6-12 months to live then. He says that he completed HCPOA, Living Will, and DNR and made sure all of his affairs are in order. He is glad he has had more time but acknowledges changes/decline in his breathing over the past ~6 months. He does tell me that he thinks he may have a couple years left or less (I did not share this with his daughter at his request). Of note his wife is in SNF and "is an invalid" - he says that she cannot speak or care for herself at all. He visits her everyday but feels hurt that he cannot care for her himself. He tells me that he is in independent living and as his disease progresses and he needs more assistance he knows he can increase to assisted living and even skilled if needed. His goal now is to return to independent living and maintain good QOL. He is open to hospice and more comfort approach with decreasing QOL and with repeated/frequent hospital visits. Also spoke with daughter Brendan Herring who confirms discussion/plan and has no questions/concerns at this time. I will continue to follow.    Contacts/Participants in Discussion: Primary Decision Maker: Patient and then son/daughter   Goals of Care/Code Status/Advance Care Planning:   Code Status: DNR    Symptom Management:   Dyspnea: May utilize low dose morphine if no relief or improvement in work of breathing. He wishes to optimize current therapies for now.     Psycho-social/Spiritual:   Support System: Good support from his children and in independent living.   Prognosis: Difficult to say but could be months to years.   Discharge Planning:  Return to independent living if possible.        Chief Complaint: SOB  History of Present Illness: 74 yo with PMH of COPD, CAD, HTN, glaucoma. Admitted with increased SOB r/t COPD exacerbation and possible pneumonia.   Primary Diagnoses  Present on Admission:  . 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 . Essential hypertension . COPD exacerbation  Palliative Review of Systems:   + SOB   I have reviewed the medical record, interviewed the patient and family, and examined the patient. The following aspects are pertinent.  Past Medical History  Diagnosis Date  . Emphysema   . Hypertension   . Coronary artery disease   . Thyroid disease   . Glaucoma   . Hypercholesterolemia   . COPD (chronic obstructive pulmonary disease)    History   Social History  . Marital Status: Married    Spouse Name: N/A  . Number of Children: N/A  . Years of Education: N/A   Occupational History  . retired from [employer]    Social History Main Topics  . Smoking status: Former Smoker -- 1.00 packs/day for 35 years    Types: Cigarettes    Quit date: 08/17/1994  . Smokeless tobacco: Not on file  .  Alcohol Use: No  . Drug Use: No  . Sexual Activity: Not on file   Other Topics Concern  . None   Social History Narrative   Family History  Problem Relation Age of Onset  . Emphysema Father   . Heart failure Father   . Stroke Father   . Heart disease Father   . Heart failure Brother   . Heart disease Mother    Scheduled Meds: . aspirin EC  81 mg Oral Daily  . azithromycin  500 mg Intravenous Q24H  . brinzolamide  1 drop Both Eyes TID  . budesonide-formoterol  2 puff Inhalation BID  . cefTRIAXone (ROCEPHIN)  IV  1 g Intravenous Q24H  .  diltiazem  120 mg Oral Daily  . folic acid  1 mg Oral Daily  . heparin  5,000 Units Subcutaneous 3 times per day  . insulin aspart  0-15 Units Subcutaneous TID WC  . insulin aspart  0-5 Units Subcutaneous QHS  . insulin aspart  3 Units Subcutaneous TID WC  . insulin detemir  10 Units Subcutaneous QHS  . latanoprost  1 drop Both Eyes QHS  . levothyroxine  100 mcg Oral Daily  . lisinopril  40 mg Oral Daily  . methylPREDNISolone (SOLU-MEDROL) injection  60 mg Intravenous Q6H  . multivitamin with minerals  1 tablet Oral Daily  . sodium chloride  3 mL Intravenous Q12H  . thiamine  100 mg Oral Daily  . tiotropium  18 mcg Inhalation Daily   Continuous Infusions: . sodium chloride 50 mL/hr at 02/25/15 2130   PRN Meds:.acetaminophen **OR** acetaminophen, albuterol, ondansetron **OR** ondansetron (ZOFRAN) IV Medications Prior to Admission:  Prior to Admission medications   Medication Sig Start Date End Date Taking? Authorizing Provider  albuterol (PROAIR HFA) 108 (90 BASE) MCG/ACT inhaler Inhale 2 puffs into the lungs every 4 (four) hours as needed. Patient taking differently: Inhale 2 puffs into the lungs every 4 (four) hours as needed for shortness of breath.  02/21/15  Yes Michele Mcalpine, MD  albuterol (PROVENTIL) (2.5 MG/3ML) 0.083% nebulizer solution Take 2.5 mg by nebulization 4 (four) times daily as needed for shortness of breath.    Yes Historical Provider, MD  amoxicillin-clavulanate (AUGMENTIN) 875-125 MG per tablet Take 1 tablet by mouth 2 (two) times daily. Patient taking differently: Take 1 tablet by mouth 2 (two) times daily. Started medication on 02-21-15 02/22/15  Yes Michele Mcalpine, MD  aspirin 81 MG tablet Take 81 mg by mouth daily.     Yes Historical Provider, MD  bimatoprost (LUMIGAN) 0.03 % ophthalmic solution Place 1 drop into both eyes at bedtime.   Yes Historical Provider, MD  brinzolamide (AZOPT) 1 % ophthalmic suspension Place 1 drop into both eyes 3 (three) times daily.   Yes  Historical Provider, MD  Calcium Carbonate-Vitamin D (CALCIUM 600-D) 600-400 MG-UNIT per tablet Take 1 tablet by mouth daily.    Yes Historical Provider, MD  cholecalciferol (VITAMIN D) 1000 UNITS tablet Take 1,000 Units by mouth daily.   Yes Historical Provider, MD  diltiazem (CARDIZEM) 120 MG tablet Take 120 mg by mouth daily.     Yes Historical Provider, MD  levothyroxine (SYNTHROID, LEVOTHROID) 100 MCG tablet Take 100 mcg by mouth daily.     Yes Historical Provider, MD  lisinopril (PRINIVIL,ZESTRIL) 20 MG tablet Take 40 mg by mouth daily.    Yes Historical Provider, MD  Probiotic Product (PROBIOTIC PO) Take 1 tablet by mouth daily.   Yes Historical Provider,  MD  SPIRIVA RESPIMAT 2.5 MCG/ACT AERS INHALE 2 PUFFS ONCE DAILY AS DIRECTED. 12/03/14  Yes Barbaraann Share, MD  SYMBICORT 160-4.5 MCG/ACT inhaler INHALE 2 PUFFS TWICE DAILY 07/31/14  Yes Barbaraann Share, MD  NON FORMULARY Oxygen 3.5p-4 Liters daily.    Historical Provider, MD   Allergies  Allergen Reactions  . Benazepril Hcl     REACTION: sensitive to sunlight  . Levofloxacin     REACTION: tendonitis   CBC:    Component Value Date/Time   WBC 5.7 02/26/2015 0320   HGB 13.0 02/26/2015 0320   HCT 41.3 02/26/2015 0320   PLT 233 02/26/2015 0320   MCV 104.0* 02/26/2015 0320   NEUTROABS 6.0 02/25/2015 1653   LYMPHSABS 1.0 02/25/2015 1653   MONOABS 1.5* 02/25/2015 1653   EOSABS 0.3 02/25/2015 1653   BASOSABS 0.1 02/25/2015 1653   Comprehensive Metabolic Panel:    Component Value Date/Time   NA 138 02/26/2015 0320   K 4.4 02/26/2015 0320   CL 94* 02/26/2015 0320   CO2 35* 02/26/2015 0320   BUN 11 02/26/2015 0320   CREATININE 0.69 02/26/2015 0320   GLUCOSE 237* 02/26/2015 0320   CALCIUM 9.0 02/26/2015 0320   AST 19 02/26/2015 0320   ALT 20 02/26/2015 0320   ALKPHOS 57 02/26/2015 0320   BILITOT 0.2* 02/26/2015 0320   PROT 6.2* 02/26/2015 0320   ALBUMIN 2.9* 02/26/2015 0320    Physical Exam:  Vital Signs: BP 137/61 mmHg   Pulse 83  Temp(Src) 98.3 F (36.8 C) (Oral)  Resp 18  Ht 6\' 1"  (1.854 m)  Wt 59.421 kg (131 lb)  BMI 17.29 kg/m2  SpO2 100% SpO2: SpO2: 100 % O2 Device: O2 Device: Nasal Cannula O2 Flow Rate: O2 Flow Rate (L/min): 5 L/min Intake/output summary:  Intake/Output Summary (Last 24 hours) at 02/26/15 1453 Last data filed at 02/26/15 1300  Gross per 24 hour  Intake    440 ml  Output    975 ml  Net   -535 ml   LBM: Last BM Date: 02/25/15 Baseline Weight: Weight: 59.875 kg (132 lb) Most recent weight: Weight: 59.421 kg (131 lb)  Exam Findings:  General: NAD, lying in bed, pleasant, thin HEENT: [place]/AT CVS: RRR Resp: Labored but able to converse, purse lipped breathing Abd: Soft, NT, ND Neuro: Awake, alert, oriented x3            Palliative Performance Scale: 50 %                Additional Data Reviewed: Recent Labs     02/25/15  1653  02/26/15  0320  WBC  8.8  5.7  HGB  13.1  13.0  PLT  224  233  NA  136  138  BUN  13  11  CREATININE  0.58*  0.69     Time In: 1345 Time Out: 1500 Time Total:  Greater than 50%  of this time was spent counseling and coordinating care related to the above assessment and plan.   Signed by:  Yong Channel, NP Palliative Medicine Team Pager # [phone] (M-F 8a-5p) Team Phone # [phone] (Nights/Weekends)

## 2015-02-26 NOTE — Evaluation (Signed)
Occupational Therapy Evaluation Patient Details Name: Brendan Herring MRN: [id number] DOB: [birth year] Today's Date: 02/26/2015    History of Present Illness 74 y.o. male with history of COPD presented with increased shortness of breath. PMH includes emphysema, hypertension, CAD, thyroid disease, glaucoma, and hypercholesterolemia.   Clinical Impression   Pt admitted with above. Pt independent with ADLs, PTA. Feel pt will benefit from acute OT to increase activity tolerance and independence prior to d/c.     Follow Up Recommendations  No OT follow up;Supervision - Intermittent    Equipment Recommendations  None recommended by OT    Recommendations for Other Services       Precautions / Restrictions Precautions Precautions: Fall Restrictions Weight Bearing Restrictions: No      Mobility Bed Mobility Overal bed mobility: Modified Independent                Transfers Overall transfer level: Needs assistance   Transfers: Sit to/from Stand Sit to Stand: Min guard;Supervision         General transfer comment: cues for hand placement.    Balance  No LOB in session. Used walker for ambulation.                                          ADL Overall ADL's : Needs assistance/impaired     Grooming: Wash/dry face;Oral care;Applying deodorant;Set up;Supervision/safety;Standing;Sitting               Lower Body Dressing: Sit to/from stand;Min guard   Toilet Transfer: Min guard;Ambulation;RW (bed/chair)           Functional mobility during ADLs: Min guard;Rolling walker General ADL Comments: Educated on energy conservation and reinforced deep breathing technique.      Vision  Pt wears glasses and has history of glaucoma.   Perception     Praxis      Pertinent Vitals/Pain Pain Assessment: No/denies pain; Pt on 5L of O2 in session and O2 remained in 90s and HR up to 113.     Hand Dominance     Extremity/Trunk Assessment Upper  Extremity Assessment Upper Extremity Assessment: Overall WFL for tasks assessed   Lower Extremity Assessment Lower Extremity Assessment: Defer to PT evaluation       Communication Communication Communication: HOH   Cognition Arousal/Alertness: Awake/alert Behavior During Therapy: WFL for tasks assessed/performed Overall Cognitive Status: Within Functional Limits for tasks assessed                     General Comments       Exercises       Shoulder Instructions      Home Living Family/patient expects to be discharged to:: Other (Comment) ([facility]-independent living) Living Arrangements: Alone Available Help at Discharge:  (nurses available at facility) Type of Home: Independent living facility             Bathroom Shower/Tub:  (tub converted to a shower)   Bathroom Toilet: Standard     Home Equipment: Grab bars - toilet;Grab bars - tub/shower;Walker - 4 wheels;Other (comment) (home O2)          Prior Functioning/Environment Level of Independence: Independent with assistive device(s)        Comments: goes to dining hall for meals    OT Diagnosis: Other (comment) (cardiopulmonary status limiting activity)   OT Problem List: Decreased knowledge of use of  DME or AE;Cardiopulmonary status limiting activity   OT Treatment/Interventions: Self-care/ADL training;Therapeutic exercise;Energy conservation;DME and/or AE instruction;Therapeutic activities;Patient/family education;Balance training    OT Goals(Current goals can be found in the care plan section) Acute Rehab OT Goals Patient Stated Goal: not stated OT Goal Formulation: With patient Time For Goal Achievement: 03/05/15 Potential to Achieve Goals: Good ADL Goals Pt Will Perform Grooming: with modified independence;standing;sitting Pt Will Perform Upper Body Bathing: with modified independence;sitting;standing Pt Will Perform Lower Body Bathing: with modified independence;sit to/from  stand Pt Will Perform Lower Body Dressing: with modified independence;sit to/from stand Pt Will Transfer to Toilet: with modified independence;ambulating;regular height toilet;grab bars Additional ADL Goal #1: Pt will independently verbalize 3/3 energy conservation techniques and utilize during session.  OT Frequency: Min 2X/week   Barriers to D/C:            Co-evaluation              End of Session Equipment Utilized During Treatment: Gait belt;Rolling walker;Oxygen  Activity Tolerance: Patient tolerated treatment well Patient left: in bed;with call bell/phone within reach   Time: 1030-1052 (few minutes spent for pt putting in meal order and OT getting supplies) OT Time Calculation (min): 22 min Charges:  OT General Charges $OT Visit: 1 Procedure OT Evaluation $Initial OT Evaluation Tier I: 1 Procedure G-CodesEarlie Raveling:    Eavan Gonterman L OTR/L [phone] 02/26/2015, 11:12 AM

## 2015-02-26 NOTE — Progress Notes (Signed)
Initial Nutrition Assessment  DOCUMENTATION CODES:  Severe malnutrition in context of chronic illness, Underweight  INTERVENTION:  Ensure Enlive po BID, each supplement provides 350 kcal and 20 grams of protein  NUTRITION DIAGNOSIS:  Increased nutrient needs related to chronic illness as evidenced by estimated needs  GOAL:  Patient will meet greater than or equal to 90% of their needs  MONITOR:  PO intake, Supplement acceptance, Labs, Weight trends, I & O's  REASON FOR ASSESSMENT:  Consult COPD Protocol  ASSESSMENT: 74 y.o. Male with history of COPD presented with increased shortness of breath. PMH includes emphysema, hypertension, CAD, thyroid disease, glaucoma, and hypercholesterolemia.  Patient states his appetite is "ok".  [facility]  No recent weight loss reported.  Drinks [employer] Ensure and/or Boost oral nutrition supplements.  RD to order during hospitalization.  Nutrition-Focused physical exam completed. Findings are severe fat depletion, severe muscle depletion, and no edema.   Diet Order:  Diet heart healthy/carb modified Room service appropriate?: Yes; Fluid consistency:: Thin  Skin:  Reviewed, no issues  Last BM:  7/11  Height:  Ht Readings from Last 1 Encounters:  02/25/15 6\' 1"  (1.854 m)    Weight:  Wt Readings from Last 1 Encounters:  02/25/15 131 lb (59.421 kg)    Ideal Body Weight:  83.6 kg  Wt Readings from Last 10 Encounters:  02/25/15 131 lb (59.421 kg)  12/28/14 135 lb (61.236 kg)  08/24/14 134 lb 9.6 oz (61.054 kg)  04/20/14 136 lb 3.2 oz (61.78 kg)  11/06/13 134 lb 6.4 oz (60.963 kg)  09/27/13 124 lb 12.8 oz (56.609 kg)  09/21/13 132 lb 4.4 oz (60 kg)  09/13/13 132 lb 3.2 oz (59.966 kg)  07/10/13 130 lb 12.8 oz (59.33 kg)  01/11/13 128 lb 12.8 oz (58.423 kg)    BMI:  Body mass index is 17.29 kg/(m^2).  Estimated Nutritional Needs:  Kcal:  1600-1800  Protein:  80-90 gm  Fluid:  1.6-1.8  L  EDUCATION NEEDS:  No education needs identified at this time  Maureen ChattersKatie Basia Mcginty, RD, LDN Pager #: [phone] After-Hours Pager #: [phone]

## 2015-02-26 NOTE — Progress Notes (Signed)
UR Completed. Damiean Lukes, RN, BSN.  [phone] 

## 2015-02-26 NOTE — Evaluation (Signed)
Physical Therapy Evaluation Patient Details Name: Brendan Herring L Drewes MRN: [id number] DOB: 02/12/1941 Today's Date: 02/26/2015   History of Present Illness  74 y.o. male with history of COPD presented with increased shortness of breath. PMH includes emphysema, hypertension, CAD, thyroid disease, glaucoma, and hypercholesterolemia.  Clinical Impression  Pt was seen for evaluation of his gati and vitals with effort, but remains on bedrest.  Therefore did limit the excursion with nursing permission to assess him.  He is planning to try to go to his apartment,  but due to needing assist for all gait will see if he progresses beyond current level, and  recommend HHPT to see him.  Will see for gait and balance work, with plan to progress to lesser AD when able.    Follow Up Recommendations Home health PT;Supervision - Intermittent;Supervision for mobility/OOB    Equipment Recommendations  Rolling walker with 5" wheels    Recommendations for Other Services       Precautions / Restrictions Precautions Precautions: Fall Restrictions Weight Bearing Restrictions: No      Mobility  Bed Mobility Overal bed mobility: Modified Independent                Transfers Overall transfer level: Needs assistance Equipment used: Rolling walker (2 wheeled) Transfers: Sit to/from [employer]GI CorporationStand;Stand Pivot Transfers Sit to Stand: Min guard Stand pivot transfers: Min guard       General transfer comment: cues for hand placement.  Ambulation/Gait Ambulation/Gait assistance: Min guard Ambulation Distance (Feet): 35 Feet Assistive device: Rolling walker (2 wheeled) Gait Pattern/deviations: Step-through pattern;Narrow base of support;Trunk flexed;Shuffle Gait velocity: reduced Gait velocity interpretation: Below normal speed for age/gender    Stairs            Wheelchair Mobility    Modified Rankin (Stroke Patients Only)       Balance Overall balance assessment: Needs  assistance Sitting-balance support: Feet supported Sitting balance-[name]: Good   Postural control: Posterior lean Standing balance support: Bilateral upper extremity supported Standing balance-[name]: Fair Standing balance comment: fair- dynamic support                             Pertinent Vitals/Pain Pain Assessment: No/denies pain    Home Living Family/patient expects to be discharged to:: Other (Comment) ([place] [facility] Living Arrangements: Alone Available Help at Discharge: Available 24 hours/day;Personal care attendant Type of Home: Independent living facility         Home Equipment: Grab bars - toilet;Grab bars - tub/shower;Walker - 4 wheels;Other (comment) Additional Comments: using 5L O2 at home    Prior Function Level of Independence: Independent with assistive device(s)         Comments: goes to dining hall for meals     Hand Dominance        Extremity/Trunk Assessment   Upper Extremity Assessment: Overall WFL for tasks assessed           Lower Extremity Assessment: Overall WFL for tasks assessed (L ankle 4- DF)      Cervical / Trunk Assessment: Normal  Communication   Communication: HOH  Cognition Arousal/Alertness: Awake/alert Behavior During Therapy: WFL for tasks assessed/performed Overall Cognitive Status: Within Functional Limits for tasks assessed                      General Comments General comments (skin integrity, edema, etc.): Pt is having some difficulty with gait due to need for  5L and is currently on bedrest, limited time up OOB to walk.  Was able to maneuver short trip with O2 sats in 90's the entire time and pulses in 90's both pre and post walk    Exercises        Assessment/Plan    PT Assessment Patient needs continued PT services  PT Diagnosis Difficulty walking   PT Problem List Decreased strength;Decreased range of motion;Decreased activity tolerance;Decreased  balance;Decreased mobility;Cardiopulmonary status limiting activity  PT Treatment Interventions DME instruction;Gait training;Functional mobility training;Therapeutic activities;Therapeutic exercise;Balance training;Neuromuscular re-education;Patient/family education   PT Goals (Current goals can be found in the Care Plan section) Acute Rehab PT Goals Patient Stated Goal: not stated PT Goal Formulation: With patient Time For Goal Achievement: 03/12/15 Potential to Achieve Goals: Good    Frequency Min 2X/week   Barriers to discharge Decreased caregiver support [facility] in [place] [facility]    Co-evaluation               End of Session Equipment Utilized During Treatment: Gait belt;Oxygen Activity Tolerance: Patient tolerated treatment well;Patient limited by fatigue Patient left: in bed;with call bell/phone within reach;with nursing/sitter in room Nurse Communication: Mobility status         Time: 9604-5409 PT Time Calculation (min) (ACUTE ONLY): 23 min   Charges:   PT Evaluation $Initial PT Evaluation Tier I: 1 Procedure PT Treatments $Gait Training: 8-22 mins   PT G Codes:        Ivar Drape Mar 04, 2015, 1:00 PM   Samul Dada, PT MS Acute Rehab Dept. Number: [id number] and [facility] [phone]

## 2015-02-26 NOTE — Progress Notes (Signed)
Pt. arrived via stretcher accompanied by RN and daughter. Pt. is A&Ox4, cooperative, and VS taken. Pt. Place on tele and is orientated to room. Teresa CoombsKatrina Fonda Rochon [phone]

## 2015-02-26 NOTE — Care Management Note (Addendum)
Case Management Note  Patient Details  Name: Brendan Herring MRN: [id number] Date of Birth: 12/04/1940  Subjective/Objective:     Pt admitted with COPD excerbation               Action/Plan:  [name]  Pt is on home 02 (5L) .  Pt placed on COPD Gold Protocol.  CM will assess for needs and arrange necessary discharge services.   Expected Discharge Date:                  Expected Discharge Plan:  Assisted Living / Rest Home ([facility] )  In-House Referral:  Clinical Social Work  Discharge planning Services  CM Consult  Post Acute Care Choice:    Choice offered to:     DME Arranged:    DME Agency:     HH Arranged:    HH Agency:     Status of Service:  In process, will continue to follow  Medicare Important Message Given:  No Date Medicare IM Given:    Medicare IM give by:    Date Additional Medicare IM Given:    Additional Medicare Important Message give by:     If discussed at Long Length of Stay Meetings, dates discussed:    Additional Comments: CM assessed pt, pt is deconditioned with noticeable shortness of breath with talking.  [facility], however pt may require more care upon returning to facility, CSW consulted.  CM will order COPD Gold Protocol in Manage Orders. Pt sees PCP [name] and Pulmonologist [name] [name] out[facility]. CM will continue to monitor for disposition needs [name], Corinne Goucher S, RN 02/26/2015, 11:03 AM

## 2015-02-27 ENCOUNTER — Other Ambulatory Visit: Payer: Self-pay | Admitting: Pulmonary Disease

## 2015-02-27 DIAGNOSIS — E43 Unspecified severe protein-calorie malnutrition: Secondary | ICD-10-CM | POA: Insufficient documentation

## 2015-02-27 DIAGNOSIS — J189 Pneumonia, unspecified organism: Secondary | ICD-10-CM

## 2015-02-27 DIAGNOSIS — J441 Chronic obstructive pulmonary disease with (acute) exacerbation: Principal | ICD-10-CM

## 2015-02-27 DIAGNOSIS — J9621 Acute and chronic respiratory failure with hypoxia: Secondary | ICD-10-CM

## 2015-02-27 LAB — GLUCOSE, CAPILLARY
Glucose-Capillary: 170 mg/dL — ABNORMAL HIGH (ref 65–99)
Glucose-Capillary: 123 mg/dL — ABNORMAL HIGH (ref 65–99)
Glucose-Capillary: 177 mg/dL — ABNORMAL HIGH (ref 65–99)
Glucose-Capillary: 161 mg/dL — ABNORMAL HIGH (ref 65–99)

## 2015-02-27 LAB — HEMOGLOBIN A1C
HEMOGLOBIN A1C: 6 % — AB (ref 4.8–5.6)
Hgb A1c MFr Bld: 6.2 % — ABNORMAL HIGH (ref 4.8–5.6)
Mean Plasma Glucose: 126 mg/dL
Mean Plasma Glucose: 131 mg/dL

## 2015-02-27 MED ORDER — IPRATROPIUM-ALBUTEROL 0.5-2.5 (3) MG/3ML IN SOLN
3.0000 mL | Freq: Four times a day (QID) | RESPIRATORY_TRACT | Status: DC
  Administered 2015-02-27 (×2): 3 mL via RESPIRATORY_TRACT
  Filled 2015-02-27 (×3): qty 3

## 2015-02-27 MED ORDER — METHYLPREDNISOLONE SODIUM SUCC 125 MG IJ SOLR
60.0000 mg | Freq: Two times a day (BID) | INTRAMUSCULAR | Status: DC
  Administered 2015-02-27: 60 mg via INTRAVENOUS
  Filled 2015-02-27 (×3): qty 0.96

## 2015-02-27 NOTE — Clinical Social Work Note (Signed)
Clinical Social Work Assessment  Patient Details  Name: Brendan Herring MRN: [id number] Date of Birth: 03/03/1941  Date of referral:  02/27/15               Reason for consult:  Facility Placement, Other (Comment Required) (COPD protocol)                Permission sought to share information with:    Permission granted to share information::  Yes, Verbal Permission Granted  Name::        Agency::  [facility]  Relationship::     Contact Information:     Housing/Transportation Living arrangements for the past 2 months:  [facility] Source of Information:  Patient Patient Interpreter Needed:  None Criminal Activity/Legal Involvement Pertinent to Current Situation/Hospitalization:  No - Comment as needed Significant Relationships:  Adult Children Lives with:  Facility Resident Do you feel safe going back to the place where you live?  Yes Need for family participation in patient care:  No (Coment)  Care giving concerns:  [facility]- no care giving concerns at this time   Office managerocial Worker assessment / plan: CSW spoke with pt concerning safety at facility/ COPD Gold Protocol   Employment status:  Retired Health and safety inspectornsurance information:    PT Recommendations:  Home with Home Health Information / Referral to community resources:     Patient/Family's Response to care:  Patient [facility]- he is agreeable to having PT/OT and thinks he is capable of doing ADLs at facility but is willing to transition to higher level of care if he is unable to once he returns home.    Pt does not report any symptoms of depression or anxiety related to his COPD diagnosis- no overt signs of depression/anxiety about returning home without constant supervision.  Pt presented as pleasant and appeared to be in a good mood during the interview- made multiple jokes and expressed desire  to return home.  Patient/Family's Understanding of and Emotional Response to Diagnosis, Current Treatment, and Prognosis:  Pt was very matter of fact about his medical condition and understands that he is limited by his condition.  Patient did not express any questions or concerns about current treatment plan.  Emotional Assessment Appearance:  Appears stated age Attitude/Demeanor/Rapport:    Affect (typically observed):  Appropriate, Pleasant Orientation:  Oriented to Self, Oriented to Place, Oriented to  Time, Oriented to Situation Alcohol / Substance use:  Not Applicable Psych involvement (Current and /or in the community):  No (Comment)  Discharge Needs  Concerns to be addressed:  Care Coordination Readmission within the last 30 days:  No Current discharge risk:  None Barriers to Discharge:  Continued Medical Work up   Peabody EnergyHoloman, Davyn Morandi M, LCSW 02/27/2015, 10:36 AM

## 2015-02-27 NOTE — Progress Notes (Signed)
[facility] PROGRESS NOTE  PCP: Dr. Daisy Floro Pulmonologist: Dr. Marcelyn Bruins >Dr. Olean Ree   Brief summary 74 year old male, [facility], with history of severe COPD, chronic hypoxic respiratory failure on home oxygen 5 L/m, HTN, CAD, hypercholesterolemia, former tobacco abuse & hypothyroid presented with 5 days history of productive cough of dark green sputum, worsening dyspnea and wheezing but no fevers. Treated by PCP with Amoxil without improvement. Patient admitted for management of acute on chronic hypoxic respiratory failure secondary to COPD exacerbation and possible bilateral basal community-acquired pneumonia.    Assessment/Plan: Acute on chronic hypoxic respiratory failure secondary to COPD exacerbation & pneumonia - Treating empirically with oxygen, bronchodilators, IV Solu-Medrol and antibiotics for pneumonia - Clinically improving. - Palliative care input appreciated. Plan would be to treat as appropriate and returned to independent living.  Bibasal community-acquired pneumonia - Continue Rocephin and azithromycin - We'll need follow-up chest x-ray in 4-6 weeks to ensure resolution of pneumonia findings  COPD exacerbation - Treated as above. Improving. Taper down steroids.  Uncontrolled DM 2 - Continue long-acting insulin plus sliding-scale insulin. - A1c in 2011:6.6. Will repeat  - Fluctuating and mildly uncontrolled.  Essential hypertension - continue diltiazem and lisinopril - Mildly uncontrolled   Macrocytosis TSH & B-12 normal   Hypercholesterolemia  Hypothyroid - Continue Synthroid  Severe malnutrition in context of chronic illness, Underweight -  continue management per dietitian.    DVT prophylaxis: Subcutaneous heparin  Code Status: DO NOT RESUSCITATE Family Communication: None at bedside Disposition Plan:  DC home when medically stable-possibly in the next 1-2  days   Consultants:  None  Procedures:  None  Antibiotics:  Rocephin and azithro 7/11 >  HPI/Subjective: States that dyspnea has improved by about 80%. Mild and nonproductive cough. No chest pain. Had BM 7/13   Objective: Filed Vitals:   02/26/15 2119 02/26/15 2140 02/27/15 0400 02/27/15 1003  BP: 138/87  160/82   Pulse: 76 85 96   Temp: 98.1 F (36.7 C)  98.1 F (36.7 C)   TempSrc: Oral  Oral   Resp: Height:      Weight:      SpO2: 100% 100% 100% 99%    Intake/Output Summary (Last 24 hours) at 02/27/15 1352 Last data filed at 02/27/15 0800  Gross per 24 hour  Intake    240 ml  Output    550 ml  Net   -310 ml   Filed Weights   02/25/15 1629 02/25/15 2126  Weight: 59.875 kg (132 lb) 59.421 kg (131 lb)    Exam:  General: Pleasant elderly frail male sitting up comfortably in chair this morning Heart: S1 and S2 heard, RRR. No JVD, murmurs or pedal edema. Telemetry: Sinus tachycardia in the 100s. Lungs: Globally reduced/distant breath sounds. No wheezing, rhonchi or crackles. No increased work of breathing. Able to speak in full sentences. Abdomen: Soft, nontender, nondistended, positive bowel sounds.  Neuro: Alert and oriented 3. No focal deficits.   Data Reviewed: Basic Metabolic Panel:  Recent Labs Lab 02/25/15 1653 02/26/15 0320  NA 136 138  K 4.6 4.4  CL 94* 94*  CO2 35* 35*  GLUCOSE 126* 237*  BUN 13 11  CREATININE 0.58* 0.69  CALCIUM 8.7* 9.0   Liver Function Tests:  Recent Labs Lab 02/25/15 1653 02/26/15 0320  AST 21 19  ALT 20 20  ALKPHOS 61 57  BILITOT 0.5 0.2*  PROT 6.0* 6.2*  ALBUMIN  3.0* 2.9*   No results for input(s): LIPASE, AMYLASE in the last 168 hours. No results for input(s): AMMONIA in the last 168 hours. CBC:  Recent Labs Lab 02/25/15 1653 02/26/15 0320  WBC 8.8 5.7  NEUTROABS 6.0  --   HGB 13.1 13.0  HCT 41.4 41.3  MCV 104.3* 104.0*  PLT 224 233   Cardiac Enzymes:  Recent Labs Lab  02/25/15 1653  TROPONINI <0.03   BNP (last 3 results)  Recent Labs  02/25/15 1653  BNP 1424.3*    ProBNP (last 3 results) No results for input(s): PROBNP in the last 8760 hours.  CBG:  Recent Labs Lab 02/26/15 1151 02/26/15 1638 02/26/15 2116 02/27/15 0826 02/27/15 1119  GLUCAP 171* 226* 108* 170* 123*    No results found for this or any previous visit (from the past 240 hour(s)).   Studies: Dg Chest 2 View  02/25/2015   CLINICAL DATA:  Shortness of breath and extreme weakness for 1 week. Recently started antibiotics for bronchitis. Initial encounter.  EXAM: CHEST  2 VIEW  COMPARISON:  09/13/2013 and 10/26/2009.  CT 10/25/2009.  FINDINGS: The heart size and mediastinal contours are stable with aortic atherosclerosis. The lungs are markedly hyperinflated. Superimposed patchy airspace opacities posteriorly at both lung bases on the lateral view difficult to exclude. These are not well seen on the frontal examination. There is no significant pleural effusion or edema. The bones appear unchanged.  IMPRESSION: Severe chronic obstructive lung disease. Cannot exclude superimposed patchy opacities at both lung bases. Followup PA and lateral chest X-ray is recommended in 3-4 weeks following trial of antibiotic therapy to ensure resolution and exclude underlying malignancy.   Electronically Signed   By: Carey BullocksWilliam  Veazey M.D.   On: 02/25/2015 18:21    Scheduled Meds: . aspirin EC  81 mg Oral Daily  . azithromycin  500 mg Intravenous Q24H  . brinzolamide  1 drop Both Eyes TID  . budesonide-formoterol  2 puff Inhalation BID  . cefTRIAXone (ROCEPHIN)  IV  1 g Intravenous Q24H  . diltiazem  120 mg Oral Daily  . folic acid  1 mg Oral Daily  . heparin  5,000 Units Subcutaneous 3 times per day  . insulin aspart  0-15 Units Subcutaneous TID WC  . insulin aspart  0-5 Units Subcutaneous QHS  . insulin aspart  3 Units Subcutaneous TID WC  . insulin detemir  10 Units Subcutaneous QHS  .  latanoprost  1 drop Both Eyes QHS  . levothyroxine  100 mcg Oral Daily  . lisinopril  40 mg Oral Daily  . methylPREDNISolone (SOLU-MEDROL) injection  60 mg Intravenous Q6H  . multivitamin with minerals  1 tablet Oral Daily  . sodium chloride  3 mL Intravenous Q12H  . thiamine  100 mg Oral Daily  . tiotropium  18 mcg Inhalation Daily   Continuous Infusions: . sodium chloride 50 mL/hr (02/27/15 0748)    Time Spent: 25 min.    Marcellus ScottHONGALGI,Nikhil Osei, MD, FACP, FHM. [facility] Pager [phone]  If 7PM-7AM, please contact night-coverage [email or web address] Password [email or web address] 02/27/2015, 1:52 PM   LOS: 2 days

## 2015-02-27 NOTE — Progress Notes (Signed)
Occupational Therapy Treatment Patient Details Name: Brendan Herring MRN: [id number] DOB: November 06, 1940 Today's Date: 02/27/2015    History of present illness 74 y.o. male with history of COPD presented with increased shortness of breath. PMH includes emphysema, hypertension, CAD, thyroid disease, glaucoma, and hypercholesterolemia.   OT comments  Pt moving well. Pt limited by fatigue. Feel that pt will not need followup OT after hospital d/c. O2 remained in 90s during session on 5L of O2.   Follow Up Recommendations  No OT follow up;Supervision - Intermittent    Equipment Recommendations  None recommended by OT    Recommendations for Other Services      Precautions / Restrictions Precautions Precautions: Fall Restrictions Weight Bearing Restrictions: No       Mobility Bed Mobility Overal bed mobility: Needs Assistance Bed Mobility: Supine to Sit;Sit to Supine     Supine to sit: Supervision Sit to supine: Supervision   General bed mobility comments: for IV  Transfers Overall transfer level: Needs assistance   Transfers: Sit to/from Stand Sit to Stand: Supervision         General transfer comment: cue for hand placement.    Balance  No LOB in session. Used walker for ambulation.                                  ADL Overall ADL's : Needs assistance/impaired     Grooming: Oral care;Set up;Supervision/safety;Standing       Lower Body Bathing: Set up;Supervison/ safety (standing-washed bottom/peri area)           Toilet Transfer: Supervision/safety;Ambulation;RW (bed)           Functional mobility during ADLs: Supervision/safety;Rolling walker General ADL Comments: Educated/reviewed energy conservation tips. Encouraged pt to sit in chair for dinner.      Vision                     Perception     Praxis      Cognition  Awake/Alert Behavior During Therapy: WFL for tasks assessed/performed Overall Cognitive Status: Within  Functional Limits for tasks assessed                       Extremity/Trunk Assessment               Exercises     Shoulder Instructions       General Comments      Pertinent Vitals/ Pain       Pain Assessment: No/denies pain; Pt on 5L of O2 in session and O2 remained in 90s.  Home Living                                          Prior Functioning/Environment              Frequency Min 2X/week     Progress Toward Goals  OT Goals(current goals can now be found in the care plan section)  Progress towards OT goals: Progressing toward goals  Acute Rehab OT Goals Patient Stated Goal: go home OT Goal Formulation: With patient Time For Goal Achievement: 03/05/15 Potential to Achieve Goals: Good ADL Goals Pt Will Perform Grooming: with modified independence;standing;sitting Pt Will Perform Upper Body Bathing: with modified independence;sitting;standing Pt Will Perform Lower Body Bathing: with modified independence;sit to/from  stand Pt Will Perform Lower Body Dressing: with modified independence;sit to/from stand Pt Will Transfer to Toilet: with modified independence;ambulating;regular height toilet;grab bars Additional ADL Goal #1: Pt will independently verbalize 3/3 energy conservation techniques and utilize during session.  Plan Discharge plan remains appropriate    Co-evaluation                 End of Session Equipment Utilized During Treatment: Gait belt;Rolling walker;Oxygen   Activity Tolerance Patient limited by fatigue   Patient Left in bed;with call bell/phone within reach   Nurse Communication Other (comment) (found pill-left it laying on counter)        Time: 1351-1406 OT Time Calculation (min): 15 min  Charges: OT General Charges $OT Visit: 1 Procedure OT Treatments $Self Care/Home Management : 8-22 mins  Earlie RavelingStraub, Elizabelle Fite L OTR/L [phone] 02/27/2015, 2:15 PM

## 2015-02-27 NOTE — Care Management Note (Addendum)
Case Management Note  Patient Details  Name: Florene Glenorman L Weitz MRN: [id number] Date of Birth: 06/19/1941  Subjective/Objective:     Pt admitted with COPD excerbation               Action/Plan:  [name]  Pt is on home 02 (5L) .  Pt placed on COPD Gold Protocol.  CM will assess for needs and arrange necessary discharge services.   Expected Discharge Date:                  Expected Discharge Plan:  Assisted Living / Rest Home ([facility] )  In-House Referral:  Clinical Social Work  Discharge planning Services  CM Consult  Post Acute Care Choice:    Choice offered to:     DME Arranged:    DME Agency:     HH Arranged:    HH Agency:     Status of Service:  In process, will continue to follow  Medicare Important Message Given:  No Date Medicare IM Given:    Medicare IM give by:    Date Additional Medicare IM Given:    Additional Medicare Important Message give by:     If discussed at Long Length of Stay Meetings, dates discussed:    Additional Comments: 02/27/2015 Raynald BlendSamantha Kadence Mimbs, RN, [phone] PT assessed pt and recommends HHPT.  CSW spoke with pt and pt would [facility], pt is agreeable to HHPT.  CM [facility] Public affairs consultant(Insurance Coordinator) at center requesting call back to arrange discharge disposition planning. If HHPT is not offered at facility, patient chose [facility].  Pt stated he can have meals delivered from facility cafeteria when he is unable to walk to cafeteria for meal.  CM assessed pt, pt is deconditioned with noticeable shortness of breath with talking.  [facility], however pt may require more care upon returning to facility, CSW consulted.  CM will order COPD Gold Protocol in Manage Orders. Pt sees PCP [name] and Pulmonologist [name] with [name] as outpt. CM will continue to  monitor for disposition needs [name], Shinika Estelle S, RN 02/27/2015, 11:32 AM

## 2015-02-27 NOTE — Care Management Important Message (Signed)
Important Message  Patient Details  Name: Brendan Herring MRN: [id number] Date of Birth: 11/22/1940   Medicare Important Message Given:  Yes-second notification given    Kyla BalzarineShealy, Sarh Kirschenbaum Abena 02/27/2015, 2:20 PMImportant Message  Patient Details  Name: Brendan Herring MRN: [id number] Date of Birth: 12/12/1940   Medicare Important Message Given:  Yes-second notification given    Kyla BalzarineShealy, Maylee Bare Abena 02/27/2015, 2:20 PM

## 2015-02-27 NOTE — Progress Notes (Signed)
Daily Progress Note   Patient Name: Brendan Herring       Date: 02/27/2015 DOB: 12/14/1940  Age: 74 y.o. MRN#: [id number] Attending Physician: Elease Etienne, MD Primary Care Physician:  Duane Lope, MD Admit Date: 02/25/2015  Reason for Consultation/Follow-up: GOC  Subjective:     Mr. Stutz is lying in bed and work of breathing seems much decreased. He feels better but is hoping at least one more day of IV antibiotics and treatment. Goal is to return independent living. He also says that he would want to now work on preparing funeral arrangements - he says he was not prepared to do this in 2011 but feels like he should do this now for his children. Therapeutic listening and emotional support provided.    Length of Stay: 2 days  Current Medications: Scheduled Meds:  . aspirin EC  81 mg Oral Daily  . azithromycin  500 mg Intravenous Q24H  . brinzolamide  1 drop Both Eyes TID  . budesonide-formoterol  2 puff Inhalation BID  . cefTRIAXone (ROCEPHIN)  IV  1 g Intravenous Q24H  . diltiazem  120 mg Oral Daily  . folic acid  1 mg Oral Daily  . heparin  5,000 Units Subcutaneous 3 times per day  . insulin aspart  0-15 Units Subcutaneous TID WC  . insulin aspart  0-5 Units Subcutaneous QHS  . insulin aspart  3 Units Subcutaneous TID WC  . insulin detemir  10 Units Subcutaneous QHS  . ipratropium-albuterol  3 mL Nebulization Q6H  . latanoprost  1 drop Both Eyes QHS  . levothyroxine  100 mcg Oral Daily  . lisinopril  40 mg Oral Daily  . methylPREDNISolone (SOLU-MEDROL) injection  60 mg Intravenous Q12H  . multivitamin with minerals  1 tablet Oral Daily  . sodium chloride  3 mL Intravenous Q12H  . thiamine  100 mg Oral Daily    Continuous Infusions:    PRN Meds: acetaminophen **OR** acetaminophen, albuterol, ondansetron **OR** ondansetron (ZOFRAN) IV  Palliative Performance Scale: 50%     Vital Signs: BP 160/82 mmHg  Pulse 96  Temp(Src) 98.1 F (36.7 C) (Oral)  Resp 18  Ht   (1.854 m)  Wt 59.421 kg (131 lb)  BMI 17.29 kg/m2  SpO2 99% SpO2: SpO2: 99 % O2 Device: O2 Device: Nasal Cannula O2 Flow Rate: O2 Flow Rate (L/min): 5 L/min  Intake/output summary:  Intake/Output Summary (Last 24 hours) at 02/27/15 1818 Last data filed at 02/27/15 1700  Gross per 24 hour  Intake    910 ml  Output    700 ml  Net    210 ml   LBM: Last BM Date: 02/25/15 Baseline Weight: Weight: 59.875 kg (132 lb) Most recent weight: Weight: 59.421 kg (131 lb)  Physical Exam: General: NAD, lying in bed, pleasant, thin HEENT: [place]/AT CVS: RRR Resp: Labored but able to converse, purse lipped breathing Abd: Soft, NT, ND Neuro: Awake, alert, oriented x3     Additional Data Reviewed: Recent Labs     02/25/15  1653  02/26/15  0320  WBC  8.8  5.7  HGB  13.1  13.0  PLT  224  233  NA  136  138  BUN  13  11  CREATININE  0.58*  0.69     Problem List:  Patient Active Problem List   Diagnosis Date Noted  . Protein-calorie malnutrition, severe 02/27/2015  . Macrocytic anemia 02/26/2015  . Palliative care encounter 02/26/2015  .  Shortness of breath 02/26/2015  . Acute respiratory failure with hypoxemia 02/25/2015  . Steroid-induced hyperglycemia 02/25/2015  . COPD exacerbation 02/25/2015  . Essential hypertension 12/27/2009  . CHRONIC RESPIRATORY FAILURE 12/27/2009  . MALNUTRITION, PROTEIN-CALORIE 11/27/2009  . 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 11/27/2009  . HYPERGLYCEMIA 11/27/2009     Palliative Care Assessment & Plan    Code Status:  DNR  Goals of Care:  Open to supportive care for hopeful recovery.   Open to considering comfort options with future or continued decline.   3. Symptom Management:  Dyspnea: May utilize low dose morphine if no relief or improvement in work of breathing. He wishes to optimize current therapies for now.   5. Prognosis: Difficult to say but could be months to years.   5. Discharge  Planning: Return to independent living if possible.    Thank you for allowing the Palliative Medicine Team to assist in the care of this patient.   Time In: 1600 Time Out: 1620 Total Time 20min Prolonged Time Billed  no    Greater than 50%  of this time was spent counseling and coordinating care related to the above assessment and plan.   Yong ChannelAlicia Adja Ruff, NP Palliative Medicine Team Pager # [phone] (M-F 8a-5p) Team Phone # [phone] (Nights/Weekends)  02/27/2015, 6:18 PM

## 2015-02-27 NOTE — Care Management Note (Signed)
Case Management Note  Patient Details  Name: Brendan Herring MRN: [id number] Date of Birth: 10/02/1940  Subjective/Objective:     Pt admitted with COPD excerbation               Action/Plan:  [name].  Pt is on home 02 (5L) .  Pt placed on COPD Gold Protocol.  CM will assess for needs and arrange necessary discharge services.   Expected Discharge Date:                  Expected Discharge Plan:  Assisted Living / Rest Home ([facility] )  In-House Referral:  Clinical Social Work  Discharge planning Services  CM Consult  Post Acute Care Choice:    Choice offered to:     DME Arranged:    DME Agency:     HH Arranged:    HH Agency:     Status of Service:  In process, will continue to follow  Medicare Important Message Given:  No Date Medicare IM Given:    Medicare IM give by:    Date Additional Medicare IM Given:    Additional Medicare Important Message give by:     If discussed at Long Length of Stay Meetings, dates discussed:    Additional Comments: 02/27/2015 Raynald BlendSamantha Juanangel Soderholm, RN, [phone] CM spoke with [name]; pt can receive HHPT through facility, order will need to be faxed to [phone] [name].  CM requested order from MD.  CM requested pulmonary rehab evaluation by MD in preparation for discharge disposition.  Order will be faxed once received.     PT assessed pt and recommends HHPT.  CSW spoke with pt and pt would like to [facility] [name], pt is agreeable to HHPT.  CM [name] at center requesting call back to arrange discharge disposition planning. If HHPT is not offered at facility, patient chose [facility].  Pt stated he can have meals delivered from facility cafeteria when he is unable to walk to cafeteria for meal.  CM assessed pt, pt is deconditioned with noticeable shortness of breath with  talking.  [name], however pt may require more care upon returning to facility, CSW consulted.  CM will order COPD Gold Protocol in Manage Orders. Pt sees PCP [name] and Pulmonologist [name] [name] as outpt. CM will continue to monitor for disposition needs [name], [name], RN 02/27/2015, 2:50 PM

## 2015-02-28 DIAGNOSIS — I10 Essential (primary) hypertension: Secondary | ICD-10-CM

## 2015-02-28 LAB — GLUCOSE, CAPILLARY
Glucose-Capillary: 152 mg/dL — ABNORMAL HIGH (ref 65–99)
Glucose-Capillary: 203 mg/dL — ABNORMAL HIGH (ref 65–99)

## 2015-02-28 LAB — HEMOGLOBIN A1C
HEMOGLOBIN A1C: 5.8 % — AB (ref 4.8–5.6)
MEAN PLASMA GLUCOSE: 120 mg/dL

## 2015-02-28 MED ORDER — AZITHROMYCIN 500 MG PO TABS
500.0000 mg | ORAL_TABLET | Freq: Every day | ORAL | Status: DC
  Administered 2015-02-28: 500 mg via ORAL
  Filled 2015-02-28: qty 1

## 2015-02-28 MED ORDER — AZITHROMYCIN 500 MG PO TABS: 500.0000 mg | ORAL_TABLET | Freq: Every day | ORAL | Status: DC

## 2015-02-28 MED ORDER — IPRATROPIUM-ALBUTEROL 0.5-2.5 (3) MG/3ML IN SOLN
3.0000 mL | Freq: Three times a day (TID) | RESPIRATORY_TRACT | Status: DC
  Administered 2015-02-28 (×2): 3 mL via RESPIRATORY_TRACT
  Filled 2015-02-28 (×2): qty 3

## 2015-02-28 MED ORDER — CEPHALEXIN 500 MG PO CAPS: 500.0000 mg | ORAL_CAPSULE | Freq: Four times a day (QID) | ORAL | Status: DC

## 2015-02-28 MED ORDER — ALBUTEROL SULFATE HFA 108 (90 BASE) MCG/ACT IN AERS: 2.0000 | INHALATION_SPRAY | RESPIRATORY_TRACT | Status: DC | PRN

## 2015-02-28 MED ORDER — CEPHALEXIN 500 MG PO CAPS
500.0000 mg | ORAL_CAPSULE | Freq: Four times a day (QID) | ORAL | Status: DC
  Administered 2015-02-28: 500 mg via ORAL
  Filled 2015-02-28 (×4): qty 1

## 2015-02-28 MED ORDER — PREDNISONE 10 MG PO TABS: ORAL_TABLET | ORAL | Status: DC

## 2015-02-28 MED ORDER — PREDNISONE 50 MG PO TABS
50.0000 mg | ORAL_TABLET | Freq: Every day | ORAL | Status: DC
  Administered 2015-02-28: 50 mg via ORAL
  Filled 2015-02-28 (×2): qty 1

## 2015-02-28 NOTE — Discharge Instructions (Signed)
Pneumonia °Pneumonia is an infection of the lungs.  °CAUSES °Pneumonia may be caused by bacteria or a virus. Usually, these infections are caused by breathing infectious particles into the lungs (respiratory tract). °SIGNS AND SYMPTOMS  °· Cough. °· Fever. °· Chest pain. °· Increased rate of breathing. °· Wheezing. °· Mucus production. °DIAGNOSIS  °If you have the common symptoms of pneumonia, your health care provider will typically confirm the diagnosis with a chest X-ray. The X-ray will show an abnormality in the lung (pulmonary infiltrate) if you have pneumonia. Other tests of your blood, urine, or sputum may be done to find the specific cause of your pneumonia. Your health care provider may also do tests (blood gases or pulse oximetry) to see how well your lungs are working. °TREATMENT  °Some forms of pneumonia may be spread to other people when you cough or sneeze. You may be asked to wear a mask before and during your exam. Pneumonia that is caused by bacteria is treated with antibiotic medicine. Pneumonia that is caused by the influenza virus may be treated with an antiviral medicine. Most other viral infections must run their course. These infections will not respond to antibiotics.  °HOME CARE INSTRUCTIONS  °· Cough suppressants may be used if you are losing too much rest. However, coughing protects you by clearing your lungs. You should avoid using cough suppressants if you can. °· Your health care provider may have prescribed medicine if he or she thinks your pneumonia is caused by bacteria or influenza. Finish your medicine even if you start to feel better. °· Your health care provider may also prescribe an expectorant. This loosens the mucus to be coughed up. °· Take medicines only as directed by your health care provider. °· Do not smoke. Smoking is a common cause of bronchitis and can contribute to pneumonia. If you are a smoker and continue to smoke, your cough may last several weeks after your  pneumonia has cleared. °· A cold steam vaporizer or humidifier in your room or home may help loosen mucus. °· Coughing is often worse at night. Sleeping in a semi-upright position in a recliner or using a couple pillows under your head will help with this. °· Get rest as you feel it is needed. Your body will usually let you know when you need to rest. °PREVENTION °A pneumococcal shot (vaccine) is available to prevent a common bacterial cause of pneumonia. This is usually suggested for: °· People over 65 years old. °· Patients on chemotherapy. °· People with chronic lung problems, such as bronchitis or emphysema. °· People with immune system problems. °If you are over 65 or have a high risk condition, you may receive the pneumococcal vaccine if you have not received it before. In some countries, a routine influenza vaccine is also recommended. This vaccine can help prevent some cases of pneumonia. You may be offered the influenza vaccine as part of your care. °If you smoke, it is time to quit. You may receive instructions on how to stop smoking. Your health care provider can provide medicines and counseling to help you quit. °SEEK MEDICAL CARE IF: °You have a fever. °SEEK IMMEDIATE MEDICAL CARE IF:  °· Your illness becomes worse. This is especially true if you are elderly or weakened from any other disease. °· You cannot control your cough with suppressants and are losing sleep. °· You begin coughing up blood. °· You develop pain which is getting worse or is uncontrolled with medicines. °· Any of the symptoms   which initially brought you in for treatment are getting worse rather than better. °· You develop shortness of breath or chest pain. °MAKE SURE YOU:  °· Understand these instructions. °· Will watch your condition. °· Will get help right away if you are not doing well or get worse. °Document Released: 08/03/2005 Document Revised: 12/18/2013 Document Reviewed: 10/23/2010 °ExitCare® Patient Information ©2015  [employer]. This information is not intended to replace advice given to you by your health care provider. Make sure you discuss any questions you have with your health care provider. ° ° °Chronic Obstructive Pulmonary Disease °Chronic obstructive pulmonary disease (COPD) is a common lung condition in which airflow from the lungs is limited. COPD is a general term that can be used to describe many different lung problems that limit airflow, including both chronic bronchitis and emphysema.  If you have COPD, your lung function will probably never return to normal, but there are measures you can take to improve lung function and make yourself feel better.  °CAUSES  °· Smoking (common).   °· Exposure to secondhand smoke.   °· Genetic problems. °· Chronic inflammatory lung diseases or recurrent infections. °SYMPTOMS  °· Shortness of breath, especially with physical activity.   °· Deep, persistent (chronic) cough with a large amount of thick mucus.   °· Wheezing.   °· Rapid breaths (tachypnea).   °· Gray or bluish discoloration (cyanosis) of the skin, especially in fingers, toes, or lips.   °· Fatigue.   °· Weight loss.   °· Frequent infections or episodes when breathing symptoms become much worse (exacerbations).   °· Chest tightness. °DIAGNOSIS  °Your health care provider will take a medical history and perform a physical examination to make the initial diagnosis.  Additional tests for COPD may include:  °· Lung (pulmonary) function tests. °· Chest X-ray. °· CT scan. °· Blood tests. °TREATMENT  °Treatment available to help you feel better when you have COPD includes:  °· Inhaler and nebulizer medicines. These help manage the symptoms of COPD and make your breathing more comfortable. °· Supplemental oxygen. Supplemental oxygen is only helpful if you have a low oxygen level in your blood.   °· Exercise and physical activity. These are beneficial for nearly all people with COPD. Some people may also benefit from a  pulmonary rehabilitation program. °HOME CARE INSTRUCTIONS  °· Take all medicines (inhaled or pills) as directed by your health care provider. °· Avoid over-the-counter medicines or cough syrups that dry up your airway (such as antihistamines) and slow down the elimination of secretions unless instructed otherwise by your health care provider.   °· If you are a smoker, the most important thing that you can do is stop smoking. Continuing to smoke will cause further lung damage and breathing trouble. Ask your health care provider for help with quitting smoking. He or she can direct you to community resources or hospitals that provide support. °· Avoid exposure to irritants such as smoke, chemicals, and fumes that aggravate your breathing. °· Use oxygen therapy and pulmonary rehabilitation if directed by your health care provider. If you require home oxygen therapy, ask your health care provider whether you should purchase a pulse oximeter to measure your oxygen level at home.   °· Avoid contact with individuals who have a contagious illness. °· Avoid extreme temperature and humidity changes. °· Eat healthy foods. Eating smaller, more frequent meals and resting before meals may help you maintain your strength. °· Stay active, but balance activity with periods of rest. Exercise and physical activity will help you maintain your ability to do   things you want to do. °· Preventing infection and hospitalization is very important when you have COPD. Make sure to receive all the vaccines your health care provider recommends, especially the pneumococcal and influenza vaccines. Ask your health care provider whether you need a pneumonia vaccine. °· Learn and use relaxation techniques to manage stress. °· Learn and use controlled breathing techniques as directed by your health care provider. Controlled breathing techniques include:   °¨ Pursed lip breathing. Start by breathing in (inhaling) through your nose for 1 second. Then,  purse your lips as if you were going to whistle and breathe out (exhale) through the pursed lips for 2 seconds.   °¨ Diaphragmatic breathing. Start by putting one hand on your abdomen just above your waist. Inhale slowly through your nose. The hand on your abdomen should move out. Then purse your lips and exhale slowly. You should be able to feel the hand on your abdomen moving in as you exhale.   °· Learn and use controlled coughing to clear mucus from your lungs. Controlled coughing is a series of short, progressive coughs. The steps of controlled coughing are:   °1. Lean your head slightly forward.   °2. Breathe in deeply using diaphragmatic breathing.   °3. Try to hold your breath for 3 seconds.   °4. Keep your mouth slightly open while coughing twice.   °5. Spit any mucus out into a tissue.   °6. Rest and repeat the steps once or twice as needed. °SEEK MEDICAL CARE IF:  °· You are coughing up more mucus than usual.   °· There is a change in the color or thickness of your mucus.   °· Your breathing is more labored than usual.   °· Your breathing is faster than usual.   °SEEK IMMEDIATE MEDICAL CARE IF:  °· You have shortness of breath while you are resting.   °· You have shortness of breath that prevents you from: °¨ Being able to talk.   °¨ Performing your usual physical activities.   °· You have chest pain lasting longer than 5 minutes.   °· Your skin color is more cyanotic than usual. °· You measure low oxygen saturations for longer than 5 minutes with a pulse oximeter. °MAKE SURE YOU:  °· Understand these instructions. °· Will watch your condition. °· Will get help right away if you are not doing well or get worse. °Document Released: 05/13/2005 Document Revised: 12/18/2013 Document Reviewed: 03/30/2013 °ExitCare® Patient Information ©2015 [employer]. This information is not intended to replace advice given to you by your health care provider. Make sure you discuss any questions you have with your health  care provider. ° °

## 2015-02-28 NOTE — Progress Notes (Signed)
Physical Therapy Treatment Patient Details Name: Brendan Herring MRN: [id number] DOB: 12/31/1940 Today's Date: 02/28/2015    History of Present Illness 74 y.o. male with history of COPD presented with increased shortness of breath. PMH includes emphysema, hypertension, CAD, thyroid disease, glaucoma, and hypercholesterolemia.    PT Comments    Pt motivated to go home. Somewhat impulsive. Ambulation has progressed to 60'. Started without RW 2/2 patients request. Struggled to maintain balance 2/2 fatigue. Finished with RW last 20' maintained good balance.  Follow Up Recommendations  Home health PT;Supervision - Intermittent;Supervision for mobility/OOB     Equipment Recommendations  Rolling walker with 5" wheels    Recommendations for Other Services       Precautions / Restrictions Precautions Precautions: Fall    Mobility  Bed Mobility Overal bed mobility: Needs Assistance Bed Mobility: Supine to Sit;Sit to Supine     Supine to sit: Supervision Sit to supine: Supervision   General bed mobility comments: Supervision for safety and IV.  Transfers Overall transfer level: Needs assistance Equipment used: Rolling walker (2 wheeled) Transfers: Sit to/from Stand Sit to Stand: Supervision         General transfer comment: Cue for hand placement.  Ambulation/Gait Ambulation/Gait assistance: Min guard Ambulation Distance (Feet): 60 Feet Assistive device: Rolling walker (2 wheeled) Gait Pattern/deviations: Step-through pattern;Narrow base of support;Trunk flexed   Gait velocity interpretation: at or above normal speed for age/gender General Gait Details: Pt somewhat impulsive.   Stairs            Wheelchair Mobility    Modified Rankin (Stroke Patients Only)       Balance                                    Cognition Arousal/Alertness: Awake/alert Behavior During Therapy: WFL for tasks assessed/performed Overall Cognitive Status: Within  Functional Limits for tasks assessed                      Exercises      General Comments        Pertinent Vitals/Pain Pain Assessment: No/denies pain    Home Living                      Prior Function            PT Goals (current goals can now be found in the care plan section) Progress towards PT goals: Progressing toward goals    Frequency  Min 2X/week    PT Plan Current plan remains appropriate    Co-evaluation             End of Session Equipment Utilized During Treatment: Oxygen Activity Tolerance: Patient tolerated treatment well Patient left: in bed;with call bell/phone within reach     Time: 1350-1400 PT Time Calculation (min) (ACUTE ONLY): 10 min  Charges:                       G CodesNita Sells:      Ezell Melikian, SPTA 02/28/2015, 2:10 PM

## 2015-02-28 NOTE — Care Management Note (Addendum)
Case Management Note  Patient Details  Name: Brendan Herring MRN: [id number] Date of Birth: 17-Apr-1941  Subjective/Objective:     Pt admitted with COPD excerbation               Action/Plan:  [facility]  Pt is on home 02 (5L) .  Pt placed on COPD Gold Protocol.  CM will assess for needs and arrange necessary discharge services.   Expected Discharge Date:                  Expected Discharge Plan:  Assisted Living / Rest Home ([facility] )  In-House Referral:  Clinical Social Work  Discharge planning Services  CM Consult  Post Acute Care Choice:    Choice offered to:     DME Arranged:    DME Agency:     HH Arranged:  PT HH Agency:  [facility] [place] will provide service  Status of Service:  In process, will continue to follow  Medicare Important Message Given:  No Date Medicare IM Given:    Medicare IM give by:    Date Additional Medicare IM Given:    Additional Medicare Important Message give by:     If discussed at Long Length of Stay Meetings, dates discussed:    Additional Comments: 02/28/2015 [name], RN, BSN [phone] CM faxed order 4 times to [facility] [place], spoke with Dondra Spry who was unable to confirm receipt, fax machine at facility unable to receive fax.  Dondra Spry stated that pt could bring hard copy of order back to facility when he returns.  Pt stated his daughter will transport [facility] from [facility].  CM instructed pt to speak with Tiffany and provide home health order upon arrival back to facility per request from [facility].  Pt is not appropriate for COPD Goal protocol per initiation parameters. CM placed hard copy order on pts shadow chart, additional copy placed on nurses station in pts room per bedside nurse request.   CM will also provide information on discharge instructions and requested bedside nurse attach one copy of order to discharge instructions.  02/27/15  [name], RN, [phone] CM spoke with Dondra Spry at facility; pt can receive HHPT through facility, order will need to be faxed to [phone] attention [name].  CM requested order from MD.  CM requested pulmonary rehab evaluation by MD in preparation for discharge disposition.  Order will be faxed once received.    PT assessed pt and recommends HHPT.  CSW spoke with pt and pt would like to return to [facility] [place], pt is agreeable to HHPT.  CM left voice message for [facility] Public affairs consultant) at center requesting call back to arrange discharge disposition planning. If HHPT is not offered at facility, patient chose [facility].  Pt stated he can have meals delivered from facility cafeteria when he is unable to walk to cafeteria for meal.  Pt stated he already has rolling walker at home.  CM assessed pt, pt is deconditioned with noticeable shortness of breath with talking.  Pt came from independent living at [facility], however pt may require more care upon returning to facility, CSW consulted.  CM will order COPD Gold Protocol in Manage Orders. Pt sees PCP Dr. Gildardo Cranker and Pulmonologist Dr Kriste Basque with [facility] as outpt. CM will continue to monitor for disposition needs [name], [name], RN  02/28/2015, 10:56 AM

## 2015-02-28 NOTE — Progress Notes (Signed)
Occupational Therapy Treatment Patient Details Name: Brendan Herring MRN: [id number] DOB: 04/21/1941 Today's Date: 02/28/2015    History of present illness 74 y.o. male with history of COPD presented with increased shortness of breath. PMH includes emphysema, hypertension, CAD, thyroid disease, glaucoma, and hypercholesterolemia.   OT comments  Patient making good progress towards OT goals, continue plan of care for now. Patient's O2 sats remained greater than 93% entire session while on 4 liters of O2 via [employer].    Follow Up Recommendations  No OT follow up;Supervision - Intermittent    Equipment Recommendations  Other (comment) (LH sponge and reacher)    Recommendations for Other Services  None at this time   Precautions / Restrictions Precautions Precautions: Fall Restrictions Weight Bearing Restrictions: No    Mobility Bed Mobility Overal bed mobility: Needs Assistance Bed Mobility: Supine to Sit     Supine to sit: Modified independent (Device/Increase time) Sit to supine: Supervision   General bed mobility comments: Supervision for safety and IV.  Transfers Overall transfer level: Needs assistance Equipment used: None Transfers: Sit to/from Stand Sit to Stand: Supervision General transfer comment: supervision for safety without use of AE to pull underwear and pants up to waist    Balance Overall balance assessment: Needs assistance Sitting-balance support: No upper extremity supported;Feet supported Sitting balance-[name]: Good     Standing balance support: No upper extremity supported Standing balance-[name]: Fair   ADL Overall ADL's : Needs assistance/impaired General ADL Comments: Reviewed energy conservation techniques and patient able to verbalize at least 3 independently (pursed lip breathing, seated rest breaks, and no hot showers). Pt completed LB dressing (donning underwear, pants, socks, and shoes. Encouraged patient not to reach down to feet so  he does not constrict his lungs. Also encouraged patient to purchase a LH sponge and reacher. Patient's O2 sats remained greater than 93% on 4 liters throughout session.      Cognition   Behavior During Therapy: WFL for tasks assessed/performed Overall Cognitive Status: Within Functional Limits for tasks assessed                 Pertinent Vitals/ Pain       Pain Assessment: No/denies pain   Frequency Min 2X/week     Progress Toward Goals  OT Goals(current goals can now befound in the care plan section)  Progress towards OT goals: Progressing toward goals     Plan Discharge plan remains appropriate       End of Session Equipment Utilized During Treatment: Rolling walker;Oxygen   Activity Tolerance Patient tolerated treatment well   Patient Left in bed;with call bell/phone within reach (seated EOB)     Time: [id number] OT Time Calculation (min): 15 min  Charges: OT General Charges $OT Visit: 1 Procedure OT Treatments $Self Care/Home Management : 8-22 mins  Porshea Janowski , MS, OTR/L, CLT Pager: [phone]  02/28/2015, 3:53 PM

## 2015-02-28 NOTE — Discharge Summary (Addendum)
Physician Discharge Summary  Brendan Herring ZOX:[id number] DOB: Jan 02, 1941 DOA: 02/25/2015  PCP:  Brendan Lope, MD  Primary Pulmonologist: Dr. Alroy Herring  Admit date: 02/25/2015 Discharge date: 02/28/2015  Time spent: Greater than 30 minutes  Recommendations for Outpatient Follow-up:  1. Dr. Duane Herring, PCP in 5 days 2. Dr. Alroy Herring, Pulmonology in one week. 3. Recommend follow-up chest x-ray in 4-6 weeks to insure resolution of pneumonia findings. 4. Home health PT.  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   Steroid-induced hyperglycemia   COPD exacerbation   Macrocytic anemia   Protein-calorie malnutrition, severe   Palliative care encounter   Shortness of breath   Discharge Condition: Improved & Stable  Diet recommendation: Heart healthy and diabetic diet.  Filed Weights   02/25/15 1629 02/25/15 2126  Weight: 59.875 kg (132 lb) 59.421 kg (131 lb)    History of present illness:  74 year old male, [facility], with history of severe COPD, chronic hypoxic respiratory failure on home oxygen 4-5 L/m, HTN, CAD, hypercholesterolemia, former tobacco abuse & hypothyroid presented with 5 days history of productive cough of dark green sputum, worsening dyspnea and wheezing but no fevers. Treated by PCP with Augmentin without improvement. Patient admitted for management of acute on chronic hypoxic respiratory failure secondary to COPD exacerbation and possible bilateral basal community-acquired pneumonia.  Hospital Course:   Acute on chronic hypoxic respiratory failure secondary to COPD exacerbation & pneumonia - Treating empirically with oxygen, bronchodilators, IV Solu-Medrol and antibiotics for pneumonia - Acute respiratory failure resolved. Patient states that his breathing is back to  baseline or even better than it has been for several weeks. - Patient will be transitioned to oral antibiotics, prednisone taper and continue home pulmonary medication regimen as before. - Patient states that he used to see Dr. Marcelyn Herring before but has since been assigned Dr.Scott Kriste Herring as his new pulmonologist - Palliative care input appreciated. Plan would be to treat as appropriate and return to independent living.  Bibasal community-acquired pneumonia - Treated empirically with IV Rocephin and azithromycin and has completed 3 days. We'll transition to oral Keflex and azithromycin to complete total 7 days antibiotics - We'll need follow-up chest x-ray in 4-6 weeks to ensure resolution of pneumonia findings  COPD exacerbation - Treated as above and improved. Oxygen saturation now in the high 90s on 4 L oxygen. Target oxygen saturation to 88-92 percent.  Uncontrolled DM 2 - Treated in the hospital with long-acting insulin plus sliding-scale insulin, while he was on high-dose IV steroids. - A1c : 5.8 - 6.2 suggesting good outpatient control - Fluctuating and mildly uncontrolled. His blood sugar control should improve once his steroids have been tapered off. He will be discharged without medications but recommended diabetic diet.  Essential hypertension - continue diltiazem and lisinopril - Mildly uncontrolled. Outpatient follow-up and management as needed.  Macrocytosis TSH & B-12 normal   Hypercholesterolemia  Hypothyroid - Continue Synthroid. TSH: 1.302  Severe malnutrition in context of chronic illness, Underweight - continue management per dietitian.  DO NOT RESUSCITATE    Consultants:  Palliative care medicine   Procedures:  None   Discharge Exam:  Complaints: Patient states that his breathing is back to baseline or even better than it has been for several weeks.  Filed Vitals:   02/27/15 2011 02/27/15 2013 02/28/15 0507 02/28/15 8119  BP:   168/92    Pulse:   95   Temp:   99.3 F (37.4 C)   TempSrc:   Oral   Resp:   18   Height:      Weight:      SpO2: 99% 99% 100% 98%    General: Pleasant elderly frail male lying comfortably in bed. Heart: S1 and S2 heard, RRR. No JVD, murmurs or pedal edema. Telemetry: SR. Occasional mild ST in the 100s. Lungs: Distant breath sounds-improved compared to yesterday. Clear to auscultation without wheezing, rhonchi or crackles. No increased work of breathing. Able to speak in full sentences. Abdomen: Soft, nontender, nondistended, positive bowel sounds.  Neuro: Alert and oriented 3. No focal deficits.  Discharge Instructions      Discharge Instructions    Call MD for:  difficulty breathing, headache or visual disturbances    Complete by:  As directed      Call MD for:  extreme fatigue    Complete by:  As directed      Call MD for:  persistant dizziness or light-headedness    Complete by:  As directed      Call MD for:  persistant nausea and vomiting    Complete by:  As directed      Call MD for:  severe uncontrolled pain    Complete by:  As directed      Call MD for:  temperature >100.4    Complete by:  As directed      Diet - low sodium heart healthy    Complete by:  As directed      Diet Carb Modified    Complete by:  As directed      Increase activity slowly    Complete by:  As directed             Medication List    STOP taking these medications        amoxicillin-clavulanate 875-125 MG per tablet  Commonly known as:  AUGMENTIN      TAKE these medications        albuterol (2.5 MG/3ML) 0.083% nebulizer solution  Commonly known as:  PROVENTIL  Take 2.5 mg by nebulization 4 (four) times daily as needed for shortness of breath.     albuterol 108 (90 BASE) MCG/ACT inhaler  Commonly known as:  PROAIR HFA  Inhale 2 puffs into the lungs every 4 (four) hours as needed for shortness of breath.     aspirin 81 MG tablet  Take 81 mg by mouth daily.     azithromycin 500 MG  tablet  Commonly known as:  ZITHROMAX  Take 1 tablet (500 mg total) by mouth daily.  Start taking on:  03/01/2015     bimatoprost 0.03 % ophthalmic solution  Commonly known as:  LUMIGAN  Place 1 drop into both eyes at bedtime.     brinzolamide 1 % ophthalmic suspension  Commonly known as:  AZOPT  Place 1 drop into both eyes 3 (three) times daily.     CALCIUM 600-D 600-400 MG-UNIT per tablet  Generic drug:  Calcium Carbonate-Vitamin D  Take 1 tablet by mouth daily.     cephALEXin 500 MG capsule  Commonly known as:  KEFLEX  Take 1 capsule (500 mg total) by mouth 4 (four) times daily.     cholecalciferol 1000 UNITS tablet  Commonly known as:  VITAMIN D  Take 1,000 Units by mouth daily.     diltiazem 120 MG tablet  Commonly known as:  CARDIZEM  Take 120 mg by mouth daily.     levothyroxine 100 MCG tablet  Commonly known as:  SYNTHROID, LEVOTHROID  Take 100 mcg by mouth daily.     lisinopril 20 MG tablet  Commonly known as:  PRINIVIL,ZESTRIL  Take 40 mg by mouth daily.     NON FORMULARY  Oxygen 3.5p-4 Liters daily.     predniSONE 10 MG tablet  Commonly known as:  DELTASONE  Take 5 tabs daily 2 days, then 4 tabs daily 2 days, then 3 tabs daily 2 days, then 2 tabs daily 2 days, then 1 tab daily 2 days, then stop.  Start taking on:  03/01/2015     PROBIOTIC PO  Take 1 tablet by mouth daily.     SPIRIVA RESPIMAT 2.5 MCG/ACT Aers  Generic drug:  Tiotropium Bromide Monohydrate  INHALE 2 PUFFS ONCE DAILY AS DIRECTED.     SYMBICORT 160-4.5 MCG/ACT inhaler  Generic drug:  budesonide-formoterol  INHALE 2 PUFFS TWICE DAILY       Follow-up Information    Follow up with Tiffany at [facility] [place].   Contact information:   Please follow up with Tiffany as previously communicated, provide discharge instructions including home health order for physical therapy      Follow up with  Brendan Lope, MD. Schedule an appointment as soon as possible for a visit in 5 days.    Specialty:  Family Medicine   Contact information:   [address] [place] [address] [phone]       Follow up with NADEL,Brendan M, MD. Schedule an appointment as soon as possible for a visit in 1 week.   Specialty:  Pulmonary Disease   Contact information:   [address] [place] [address] [phone]        The results of significant diagnostics from this hospitalization (including imaging, microbiology, ancillary and laboratory) are listed below for reference.    Significant Diagnostic Studies: Dg Chest 2 View  02/25/2015   CLINICAL DATA:  Shortness of breath and extreme weakness for 1 week. Recently started antibiotics for bronchitis. Initial encounter.  EXAM: CHEST  2 VIEW  COMPARISON:  09/13/2013 and 10/26/2009.  CT 10/25/2009.  FINDINGS: The heart size and mediastinal contours are stable with aortic atherosclerosis. The lungs are markedly hyperinflated. Superimposed patchy airspace opacities posteriorly at both lung bases on the lateral view difficult to exclude. These are not well seen on the frontal examination. There is no significant pleural effusion or edema. The bones appear unchanged.  IMPRESSION: Severe chronic obstructive lung disease. Cannot exclude superimposed patchy opacities at both lung bases. Followup PA and lateral chest X-ray is recommended in 3-4 weeks following trial of antibiotic therapy to ensure resolution and exclude underlying malignancy.   Electronically Signed   By: Carey Bullocks M.D.   On: 02/25/2015 18:21    Microbiology: No results found for this or any previous visit (from the past 240 hour(s)).   Labs: Basic Metabolic Panel:  Recent Labs Lab 02/25/15 1653 02/26/15 0320  NA 136 138  K 4.6 4.4  CL 94* 94*  CO2 35* 35*  GLUCOSE 126* 237*  BUN 13 11  CREATININE 0.58* 0.69  CALCIUM 8.7* 9.0   Liver Function Tests:  Recent Labs Lab 02/25/15 1653 02/26/15 0320  AST 21 19  ALT 20 20  ALKPHOS 61 57  BILITOT 0.5  0.2*  PROT 6.0* 6.2*  ALBUMIN 3.0* 2.9*   No results  for input(s): LIPASE, AMYLASE in the last 168 hours. No results for input(s): AMMONIA in the last 168 hours. CBC:  Recent Labs Lab 02/25/15 1653 02/26/15 0320  WBC 8.8 5.7  NEUTROABS 6.0  --   HGB 13.1 13.0  HCT 41.4 41.3  MCV 104.3* 104.0*  PLT 224 233   Cardiac Enzymes:  Recent Labs Lab 02/25/15 1653  TROPONINI <0.03   BNP: BNP (last 3 results)  Recent Labs  02/25/15 1653  BNP 1424.3*    ProBNP (last 3 results) No results for input(s): PROBNP in the last 8760 hours.  CBG:  Recent Labs Lab 02/27/15 1119 02/27/15 1703 02/27/15 2120 02/28/15 0538 02/28/15 1114  GLUCAP 123* 177* 161* 152* 203*       Signed:  Marcellus ScottHONGALGI,Janyra Barillas, MD, FACP, FHM. [facility] Pager [phone]  If 7PM-7AM, please contact night-coverage [email or web address] Password [email or web address] 02/28/2015, 12:31 PM

## 2015-03-01 LAB — FOLATE RBC
FOLATE, HEMOLYSATE: 385.4 ng/mL
Folate, RBC: 973 ng/mL (ref >498)
HEMATOCRIT: 39.6 % (ref 37.5–51.0)

## 2015-03-08 ENCOUNTER — Ambulatory Visit (INDEPENDENT_AMBULATORY_CARE_PROVIDER_SITE_OTHER)
Admission: RE | Admit: 2015-03-08 | Discharge: 2015-03-08 | Disposition: A | Discharge status: Home / Self Care | Payer: Medicare Other | Source: Ambulatory Visit | Attending: Pulmonary Disease | Admitting: Pulmonary Disease

## 2015-03-08 ENCOUNTER — Ambulatory Visit (INDEPENDENT_AMBULATORY_CARE_PROVIDER_SITE_OTHER): Payer: Medicare Other | Admitting: Pulmonary Disease

## 2015-03-08 ENCOUNTER — Encounter: Payer: Self-pay | Admitting: Pulmonary Disease

## 2015-03-08 VITALS — BP 150/80 | HR 101 | Wt 129.0 lb

## 2015-03-08 DIAGNOSIS — E43 Unspecified severe protein-calorie malnutrition: Secondary | ICD-10-CM | POA: Diagnosis not present

## 2015-03-08 DIAGNOSIS — J441 Chronic obstructive pulmonary disease with (acute) exacerbation: Secondary | ICD-10-CM | POA: Diagnosis not present

## 2015-03-08 DIAGNOSIS — E46 Unspecified protein-calorie malnutrition: Secondary | ICD-10-CM | POA: Diagnosis not present

## 2015-03-08 DIAGNOSIS — J9611 Chronic respiratory failure with hypoxia: Secondary | ICD-10-CM

## 2015-03-08 MED ORDER — ACETAZOLAMIDE 250 MG PO TABS: ORAL_TABLET | ORAL | Status: DC

## 2015-03-08 MED ORDER — PREDNISONE 10 MG PO TABS: ORAL_TABLET | ORAL | Status: DC

## 2015-03-08 MED ORDER — ALBUTEROL SULFATE (2.5 MG/3ML) 0.083% IN NEBU: 2.5000 mg | INHALATION_SOLUTION | Freq: Two times a day (BID) | RESPIRATORY_TRACT | Status: DC

## 2015-03-08 NOTE — Patient Instructions (Signed)
Today we updated your med list in our EPIC system...    Continue your current medications the same...  We wrote for a new NEBULIZER MACHINE w/ tubing etc...    Let's plan to use this twice daily every day as discussed...  Continue your Symbicort twice daily, the [facility]  Daily, & the PREDNISONE at  per day...  We are adding a mild diuretic- DIAMOX (Acetazolamide)  - take one tab each AM...  Today we did a follow up CXR... We will contact you w/ the results when available...   Call for any questions...  Let's plan a follow up visit in 3-4 weeks.Marland KitchenMarland Kitchen

## 2015-03-08 NOTE — Progress Notes (Signed)
Subjective:    Patient ID: Brendan Herring, male    DOB: February 19, 1941, 74 y.o.   MRN: [id number]  HPI  ~  Dec 27, 2009: Initial pulm connsult w/ [facility]>  The patient is a 74 year old male who I've been asked to see for management of emphysema. The patient carries a diagnosis of "COPD", and tells me that he has had pulmonary function studies at [facility] in 2005. These are currently not available. He was recently hospitalized at in March of this year with acute on chronic respiratory failure due to a COPD exacerbation. He required noninvasive positive pressure ventilation, but did not need to be on mechanical ventilation. He did spend some time in a skilled nursing facility to help with nutrition and severe debilitation. Currently, he is not on long acting bronchodilators, but rather nebulizer treatments 4 times a day. He is also still on chronic prednisone. He is also wearing oxygen, but did not have this prior to his recent hospitalization. He states that his breathing is much improved since being out of the hospital. He still gets winded walking through his house, but feels the physical therapy has helped. The patient has had breathing issues for greater than 10 years and states it has been stable the last one year. Prior to his hospitalization he was very functional, and spent 30 minutes a day on the treadmill. Cough and congestion has never been a big issue for him since he has quit smoking. Problem # 1: EMPHYSEMA (ICD-492.8)  The patient has fairly severe emphysema clinically, but we'll try and get his old PFTs for verification. At this point, I would like to get him on a long acting bronchodilator regimen which has been shown superior two short acting medications. I also think that he would greatly benefit from a pulmonary rehabilitation program once he is stronger. I have spent a lot of time with him discussing nutrition, and the importance of taking in large numbers of calories daily. His work of  breathing is leading to muscle breakdown for calories, and this in turn leads to worsening dyspnea. Weight loss is considered to be a very worrisome finding in those with emphysema, and usually indicates the patient has reached end-stage disease. Will also try and get the patient off chronic prednisone.  Spirometry 07/28/07 showed FVC=3.23 (66%), FEV1=0.90 (28%), %1sec=28. Mid-flows reduced at 14% predicted; c/w severe obstructive defect & GOLD Stage 4 COPD...   CT Chest w/o contrast 10/25/09 showed norm heart size, coronary & aortic atherosclerosis, mod to severe emphysema, no mass/ nodules/ airsp dis/ etc, no adenopathy...  ~  August 24, 2014:  ROV w/ [facility]>  The patient comes in today for follow-up of his known severe COPD with chronic respiratory failure. He has been fairly stable since the last, but doesn't feel quite as good as his normal self. He denies any chest congestion, cough, or purulence. He denies any lower extremity edema or chest discomfort. PLAN>  The patient feels that his breathing is a little below his usual baseline, but not excessively. He has not had any acute exacerbation, nor has he required increased rescue medication. He did wean himself off chronic prednisone in September of last year, and I've asked him to consider getting back on this if he is not satisfied with his exertional tolerance. Also stressed to him the importance of adequate nutrition and building muscle mass...  ~  Dec 28, 2014:  ROV w/ [facility]>   The patient comes in today for follow-up of  his known severe COPD with chronic respiratory failure. He has not had a recent acute exacerbation, but did have an episode of acute bronchitis at the beginning of the year. He has been trying to stay as active as possible, and is staying compliant on his oxygen. He feels that his dyspnea on exertion is at baseline. PLAN>  The patient has end-stage COPD, but continues to be more functional than I would expect given his degree of lung  disease. He has significant dyspnea on exertion, but it least he is staying at his normal baseline. I've asked him to continue on his current medications, but to consider changing his maintenance bronchodilator to his nebulizer device.   ~  March 08, 2015:  54mo ROV & post hosp check w/ SN>   8 y/o WM long time pt of [facility] w/ end-stage COPD/emphysema on Pred10/d, O2 at 3-4L/min pulse dose 24/7, [id number], Spiriva Respimat 2sp/d, NEBS w/ Albut vs ProventilHFA prn...  He was [facility] 7/11 - 02/28/15 by [facility] w/ COPD exac, faint LLL pneumonia, acute on chronic resp failure> treated w/ IV Solumedrol, O2/ NEBS, Rocephin/ Zithromax & improved; Disch on Pred taper, Keflex/ Zithromax, & continued his other meds the same; they consulted palliative care medicine while hospitalized, he is a DNR... He feels improved, dyspnea at baseline, no phlegm reported, denies f/c/s, etc...     End-stage COPD/ emphysema>     Chronic hypoxemic resp failure>     COPD exac and faint LLL pneumonia 7/16 hosp>     Other medical issues as noted> HBP, CAD s/p stent, DM2 w/ A1c=6.2, HL, Hypothyroid w/ TSH=1.30, Malutrition w/ Alb=2.9, Elev BNP at 1400 (HCO#=35), MCV=104 w/ norm B12 & Folate...  EXAM reveals Afeb, VSS, O2sat=96% on oxygen;  Heent- neg, mallampati1;  Chest- hyper-resonant, decr BS, clear w/o w/r/r or consolidation;  Heart- epig PMI, RR w/o m/r/g;  Abd- thin, meg;  Ext- neg, w/o c/c/e...   CXR 02/2015 in hosp showed aortic atherosclerosis, hyperinflated, decr lung markings, LLL opac at the angle, kyphosis/ barrell chested...   CXR 7/22?16 in office shows severe end-stage emphysema w/ improvement of the lung markings at the angles back to his baseline status...  IMP/PLAN>>  [name] has very severe end-stage Emphysema-- presented w/ acute exac ppt by LLL pneumonia, he responded to in hosp treatment & now back on his baseline med rx + Pred10mg /d;  He feels that the NEBS helped him in the hosp but he's not using his NEB at  home-- I proposed that he do breathing treatments Qid (on a B/L/D/Bed schedule, alternating Sym/ Neb+Spiriva/ Sym/ Neb);  Finally the BNP is up & Bicarb level 35-- I propose starting Diamox 250/d & we will f/u labs in 3-4weeks...     Past Medical History  Diagnosis Date  . Emphysema   . Hypertension   . Coronary artery disease   . Thyroid disease   . Glaucoma   . Hypercholesterolemia   . COPD (chronic obstructive pulmonary disease)     Past Surgical History  Procedure Laterality Date  . Back surgery  1982  . Cataract extraction  1990    bilateral  . Tonsillectomy  1994  . Wisdom tooth extraction  1960  . Vasectomy  1970  . Coronary angioplasty with stent placement      Outpatient Encounter Prescriptions as of 03/08/2015  Medication Sig  . albuterol (PROAIR HFA) 108 (90 BASE) MCG/ACT inhaler Inhale 2 puffs into the lungs every 4 (four) hours as needed for shortness of  breath.  [name] albuterol (PROVENTIL) (2.5 MG/3ML) 0.083% nebulizer solution Take 3 mLs (2.5 mg total) by nebulization 2 (two) times daily. DX J44.1  . aspirin 81 MG tablet Take 81 mg by mouth daily.    . bimatoprost (LUMIGAN) 0.03 % ophthalmic solution Place 1 drop into both eyes at bedtime.  . brinzolamide (AZOPT) 1 % ophthalmic suspension Place 1 drop into both eyes 3 (three) times daily.  . Calcium Carbonate-Vitamin D (CALCIUM 600-D) 600-400 MG-UNIT per tablet Take 1 tablet by mouth daily.   . cholecalciferol (VITAMIN D) 1000 UNITS tablet Take 1,000 Units by mouth daily.  [name] diltiazem (CARDIZEM) 120 MG tablet Take 120 mg by mouth daily.    [name] levothyroxine (SYNTHROID, LEVOTHROID) 100 MCG tablet Take 100 mcg by mouth daily.    [name] lisinopril (PRINIVIL,ZESTRIL) 20 MG tablet Take 40 mg by mouth daily.   . NON FORMULARY Oxygen 3.5p-4 Liters daily.  . Probiotic Product (PROBIOTIC PO) Take 1 tablet by mouth daily.  [name] SPIRIVA RESPIMAT 2.5 MCG/ACT AERS INHALE 2 PUFFS ONCE DAILY AS DIRECTED.  [name] SYMBICORT 160-4.5 MCG/ACT inhaler  INHALE 2 PUFFS TWICE DAILY  . [DISCONTINUED] albuterol (PROVENTIL) (2.5 MG/3ML) 0.083% nebulizer solution Take 2.5 mg by nebulization 4 (four) times daily as needed for shortness of breath.   . [DISCONTINUED] predniSONE (DELTASONE) 10 MG tablet Take 5 tabs daily 2 days, then 4 tabs daily 2 days, then 3 tabs daily 2 days, then 2 tabs daily 2 days, then 1 tab daily 2 days, then stop.  [name] acetaZOLAMIDE (DIAMOX) 250 MG tablet 1 tablet every morning  . azithromycin (ZITHROMAX) 500 MG tablet Take 1 tablet (500 mg total) by mouth daily. (Patient not taking: Reported on 03/08/2015)  . cephALEXin (KEFLEX) 500 MG capsule Take 1 capsule (500 mg total) by mouth 4 (four) times daily. (Patient not taking: Reported on 03/08/2015)  . predniSONE (DELTASONE) 10 MG tablet Once daily   No facility-administered encounter medications on file as of 03/08/2015.    Allergies  Allergen Reactions  . Benazepril Hcl     REACTION: sensitive to sunlight  . Levofloxacin     REACTION: tendonitis    Immunization History  Administered Date(s) Administered  . Influenza Split 05/16/2012, 06/17/2013  . Influenza Whole 05/17/2009, 04/24/2010, 05/18/2011  . Influenza,inj,Quad PF,36+ Mos 04/20/2014  . Pneumococcal Polysaccharide-23 05/17/2009    Current Medications, Allergies, Past Medical History, Past Surgical History, Family History, and Social History were reviewed in [employer] record.   Review of Systems  Constitutional: Negative for fever and unexpected weight change.  HENT: Negative for congestion, dental problem, ear pain, nosebleeds, postnasal drip, rhinorrhea, sinus pressure, sneezing, sore throat and trouble swallowing.   Eyes: Negative for redness and itching.  Respiratory: Positive for chest tightness and shortness of breath. Negative for wheezing.   Cardiovascular: Negative for palpitations and leg swelling.  Gastrointestinal: Negative for nausea and vomiting.  Genitourinary:  Negative for dysuria.  Musculoskeletal: Negative for joint swelling.  Skin: Negative for rash.  Neurological: Negative for headaches.  Hematological: Does not bruise/bleed easily.  Psychiatric/Behavioral: Negative for dysphoric mood. The patient is not nervous/anxious.       Objective:   Physical Exam  Thin male in no acute distress Nose without purulence or discharge noted Neck without lymphadenopathy or thyromegaly Chest with very diminished breath sounds diffusely, no active wheezing Cardiac exam with distant heart sounds, but sounds regular Lower extremities without edema, no cyanosis Alert and oriented, moves all 4 extremities.  Assessment & Plan:    IMP >>       End-stage COPD/ emphysema>     Chronic hypoxemic resp failure>     COPD exac and faint LLL pneumonia 7/16 hosp>     Other medical issues as noted>  PLAN >>  [name] has very severe end-stage Emphysema-- presented w/ acute exac ppt by LLL pneumonia, he responded to in hosp treatment & now back on his baseline med rx + Pred10mg /d;  He feels that the NEBS helped him in the hosp but he's not using his NEB at home-- I proposed that he do breathing treatments Qid (on a B/L/D/Bed schedule, alternating Sym/ Neb+Spiriva/ Sym/ Neb);  Finally the BNP is up & Bicarb level 35-- I propose starting Diamox 250/d & we will f/u labs in 3-4weeks...    Patient's Medications  New Prescriptions   ACETAZOLAMIDE (DIAMOX) 250 MG TABLET    1 tablet every morning   PREDNISONE (DELTASONE) 10 MG TABLET    Once daily  Previous Medications   ALBUTEROL (PROAIR HFA) 108 (90 BASE) MCG/ACT INHALER    Inhale 2 puffs into the lungs every 4 (four) hours as needed for shortness of breath.   ASPIRIN 81 MG TABLET    Take 81 mg by mouth daily.     AZITHROMYCIN (ZITHROMAX) 500 MG TABLET    Take 1 tablet (500 mg total) by mouth daily.   BIMATOPROST (LUMIGAN) 0.03 % OPHTHALMIC SOLUTION    Place 1 drop into both eyes at bedtime.   BRINZOLAMIDE (AZOPT) 1  % OPHTHALMIC SUSPENSION    Place 1 drop into both eyes 3 (three) times daily.   CALCIUM CARBONATE-VITAMIN D (CALCIUM 600-D) 600-400 MG-UNIT PER TABLET    Take 1 tablet by mouth daily.    CEPHALEXIN (KEFLEX) 500 MG CAPSULE    Take 1 capsule (500 mg total) by mouth 4 (four) times daily.   CHOLECALCIFEROL (VITAMIN D) 1000 UNITS TABLET    Take 1,000 Units by mouth daily.   DILTIAZEM (CARDIZEM) 120 MG TABLET    Take 120 mg by mouth daily.     LEVOTHYROXINE (SYNTHROID, LEVOTHROID) 100 MCG TABLET    Take 100 mcg by mouth daily.     LISINOPRIL (PRINIVIL,ZESTRIL) 20 MG TABLET    Take 40 mg by mouth daily.    NON FORMULARY    Oxygen 3.5p-4 Liters daily.   PROBIOTIC PRODUCT (PROBIOTIC PO)    Take 1 tablet by mouth daily.   SPIRIVA RESPIMAT 2.5 MCG/ACT AERS    INHALE 2 PUFFS ONCE DAILY AS DIRECTED.   SYMBICORT 160-4.5 MCG/ACT INHALER    INHALE 2 PUFFS TWICE DAILY  Modified Medications   Modified Medication Previous Medication   PREDNISONE 10mg  >> one tab daily    ALBUTEROL (PROVENTIL) (2.5 MG/3ML) 0.083% NEBULIZER SOLUTION albuterol (PROVENTIL) (2.5 MG/3ML) 0.083% nebulizer solution      Take 3 mLs (2.5 mg total) by nebulization 2 (two) times daily.     Take 2.5 mg by nebulization 4 (four) times daily as needed for shortness of breath.   Discontinued Medications   PREDNISONE (DELTASONE) 10 MG TABLET    Take 5 tabs daily 2 days, then 4 tabs daily 2 days, then 3 tabs daily 2 days, then 2 tabs daily 2 days, then 1 tab daily 2 days, then stop.

## 2015-03-11 ENCOUNTER — Telehealth: Payer: Self-pay | Admitting: Pulmonary Disease

## 2015-03-11 NOTE — Telephone Encounter (Signed)
Patient is very impressed that Dr. Kriste Basque took time out of his busy day to contact him and speak with him over the phone, he says that he thinks Dr. Kriste Basque is a "teddy bear" and appreciates every thing he does for him.   FYI to Dr. Kriste Basque

## 2015-03-26 ENCOUNTER — Other Ambulatory Visit: Payer: Self-pay | Admitting: Pulmonary Disease

## 2015-04-04 ENCOUNTER — Other Ambulatory Visit: Payer: Self-pay | Admitting: Adult Health

## 2015-04-05 ENCOUNTER — Ambulatory Visit: Payer: Medicare Other | Admitting: Pulmonary Disease

## 2015-04-10 ENCOUNTER — Other Ambulatory Visit (INDEPENDENT_AMBULATORY_CARE_PROVIDER_SITE_OTHER): Payer: Medicare Other

## 2015-04-10 ENCOUNTER — Encounter: Payer: Self-pay | Admitting: Pulmonary Disease

## 2015-04-10 ENCOUNTER — Ambulatory Visit (INDEPENDENT_AMBULATORY_CARE_PROVIDER_SITE_OTHER): Payer: Medicare Other | Admitting: Pulmonary Disease

## 2015-04-10 VITALS — BP 138/80 | HR 101 | Temp 97.0°F | Wt 132.6 lb

## 2015-04-10 DIAGNOSIS — I10 Essential (primary) hypertension: Secondary | ICD-10-CM

## 2015-04-10 DIAGNOSIS — J9610 Chronic respiratory failure, unspecified whether with hypoxia or hypercapnia: Secondary | ICD-10-CM

## 2015-04-10 DIAGNOSIS — J432 Centrilobular emphysema: Secondary | ICD-10-CM

## 2015-04-10 DIAGNOSIS — E46 Unspecified protein-calorie malnutrition: Secondary | ICD-10-CM

## 2015-04-10 LAB — HEPATIC FUNCTION PANEL
ALK PHOS: 48 U/L (ref 39–117)
ALT: 15 U/L (ref 0–53)
AST: 14 U/L (ref 0–37)
Albumin: 4.2 g/dL (ref 3.5–5.2)
BILIRUBIN DIRECT: 0.1 mg/dL (ref 0.0–0.3)
Total Bilirubin: 0.4 mg/dL (ref 0.2–1.2)
Total Protein: 6.8 g/dL (ref 6.0–8.3)

## 2015-04-10 LAB — BASIC METABOLIC PANEL
BUN: 20 mg/dL (ref 6–23)
CALCIUM: 10.1 mg/dL (ref 8.4–10.5)
CO2: 37 mEq/L — ABNORMAL HIGH (ref 19–32)
Chloride: 103 mEq/L (ref 96–112)
Creatinine, Ser: 0.93 mg/dL (ref 0.40–1.50)
GFR: 83.9 mL/min (ref >60.00)
Glucose, Bld: 125 mg/dL — ABNORMAL HIGH (ref 70–99)
Potassium: 4.7 mEq/L (ref 3.5–5.1)
Sodium: 146 mEq/L — ABNORMAL HIGH (ref 135–145)

## 2015-04-10 LAB — BRAIN NATRIURETIC PEPTIDE: Pro B Natriuretic peptide (BNP): 63 pg/mL (ref 0.0–100.0)

## 2015-04-10 MED ORDER — TIOTROPIUM BROMIDE MONOHYDRATE 2.5 MCG/ACT IN AERS: 1.0000 | INHALATION_SPRAY | Freq: Every day | RESPIRATORY_TRACT | Status: DC

## 2015-04-10 MED ORDER — BUDESONIDE-FORMOTEROL FUMARATE 160-4.5 MCG/ACT IN AERO: 2.0000 | INHALATION_SPRAY | Freq: Two times a day (BID) | RESPIRATORY_TRACT | Status: DC

## 2015-04-10 MED ORDER — PREDNISONE 10 MG PO TABS: ORAL_TABLET | ORAL | Status: DC

## 2015-04-10 NOTE — Patient Instructions (Signed)
Today we updated your med list in our EPIC system...     We decided to decrease your PREDNISONE  tabs by 1/2 step>    Take one tab one day & alternate w/ 1/2 tab the next day (1, 1/2, 1, 1/2, etc)...  We dicsussed adjusting your NEBULIZER TO 3 times daily (approx breakfast, lunch, dinner;    And spacing out the Symbicort & Spiriva doses between the meals & in the evening...  Stay as active as poss (great job!)  Today we rechecked your blood work...    We will contact you w/ the results when available...   Call for any questions...  Let's plan a follow up visit in 38mo, sooner if needed for problems.Marland KitchenMarland Kitchen

## 2015-04-10 NOTE — Progress Notes (Addendum)
Subjective:    Patient ID: Brendan Herring, male    DOB: [birth year], 74 y.o.   MRN: [id number]  HPI ~  Dec 27, 2009: Initial pulm connsult w/ Brendan Herring>  The patient is a 74 year old male who I've been asked to see for management of emphysema. The patient carries a diagnosis of "COPD", and tells me that he has had pulmonary function studies at [facility] in 2005. These are currently not available. He was recently hospitalized at in March of this year with acute on chronic respiratory failure due to a COPD exacerbation. He required noninvasive positive pressure ventilation, but did not need to be on mechanical ventilation. He did spend some time in a skilled nursing facility to help with nutrition and severe debilitation. Currently, he is not on long acting bronchodilators, but rather nebulizer treatments 4 times a day. He is also still on chronic prednisone. He is also wearing oxygen, but did not have this prior to his recent hospitalization. He states that his breathing is much improved since being out of the hospital. He still gets winded walking through his house, but feels the physical therapy has helped. The patient has had breathing issues for greater than 10 years and states it has been stable the last one year. Prior to his hospitalization he was very functional, and spent 30 minutes a day on the treadmill. Cough and congestion has never been a big issue for him since he has quit smoking. Problem # 1: EMPHYSEMA (ICD-492.8)  The patient has fairly severe emphysema clinically, but we'll try and get his old PFTs for verification. At this point, I would like to get him on a long acting bronchodilator regimen which has been shown superior two short acting medications. I also think that he would greatly benefit from a pulmonary rehabilitation program once he is stronger. I have spent a lot of time with him discussing nutrition, and the importance of taking in large numbers of calories daily. His work of  breathing is leading to muscle breakdown for calories, and this in turn leads to worsening dyspnea. Weight loss is considered to be a very worrisome finding in those with emphysema, and usually indicates the patient has reached end-stage disease. Will also try and get the patient off chronic prednisone.  Spirometry 07/28/07 showed FVC=3.23 (66%), FEV1=0.90 (28%), %1sec=28. Mid-flows reduced at 14% predicted; c/w severe obstructive defect & GOLD Stage 4 COPD...   CT Chest w/o contrast 10/25/09 showed norm heart size, coronary & aortic atherosclerosis, mod to severe emphysema, no mass/ nodules/ airsp dis/ etc, no adenopathy...  ~  August 24, 2014:  ROV w/ Brendan Herring>  The patient comes in today for follow-up of his known severe COPD with chronic respiratory failure. He has been fairly stable since the last, but doesn't feel quite as good as his normal self. He denies any chest congestion, cough, or purulence. He denies any lower extremity edema or chest discomfort. PLAN>  The patient feels that his breathing is a little below his usual baseline, but not excessively. He has not had any acute exacerbation, nor has he required increased rescue medication. He did wean himself off chronic prednisone in September of last year, and I've asked him to consider getting back on this if he is not satisfied with his exertional tolerance. Also stressed to him the importance of adequate nutrition and building muscle mass...  ~  Dec 28, 2014:  ROV w/ Brendan Herring>   The patient comes in today for follow-up of his  known severe COPD with chronic respiratory failure. He has not had a recent acute exacerbation, but did have an episode of acute bronchitis at the beginning of the year. He has been trying to stay as active as possible, and is staying compliant on his oxygen. He feels that his dyspnea on exertion is at baseline. PLAN>  The patient has end-stage COPD, but continues to be more functional than I would expect given his degree of lung  disease. He has significant dyspnea on exertion, but it least he is staying at his normal baseline. I've asked him to continue on his current medications, but to consider changing his maintenance bronchodilator to his nebulizer device.   ~  March 08, 2015:  34moROV & post hosp check w/ SN>   89y/o WM long time pt of Brendan Herring w/ end-stage COPD/emphysema on Pred10/d, O2 at 3-4L/min pulse dose 24/7, Symbicort160-2soBid, Spiriva Respimat 2sp/d, NEBS w/ Albut vs ProventilHFA prn...  He [facility]15 by Brendan Herring w/ COPD exac, faint LLL pneumonia, acute on chronic resp failure> treated w/ IV Solumedrol, O2/ NEBS, Rocephin/ Zithromax & improved; Disch on Pred taper, Keflex/ Zithromax, & continued his other meds the same; they consulted palliative care medicine while hospitalized, he is a DNR... He feels improved, dyspnea at baseline, no phlegm reported, denies f/c/s, etc...     End-stage COPD/ emphysema>     Chronic hypoxemic resp failure>     COPD exac and faint LLL pneumonia 7/16 hosp>     Other medical issues as noted> HBP, CAD s/p stent, DM2 w/ A1c=6.2, HL, Hypothyroid w/ TSH=1.30, Malutrition w/ Alb=2.9, Elev BNP at 1400 (HCO#=35), MCV=104 w/ norm B12 & Folate...  EXAM reveals Afeb, VSS, O2sat=96% on oxygen;  Heent- neg, mallampati1;  Chest- hyper-resonant, decr BS, clear w/o w/r/r or consolidation;  Heart- epig PMI, RR w/o m/r/g;  Abd- thin, meg;  Ext- neg, w/o c/c/e...   CXR 02/2015 in hosp showed aortic atherosclerosis, hyperinflated, decr lung markings, LLL opac at the angle, kyphosis/ barrell chested...   CXR 03/08/15 in office shows severe end-stage emphysema w/ improvement of the lung markings at the angles back to his baseline status...  IMP/PLAN>>  Brendan Herring has very severe end-stage Emphysema-- presented w/ acute exac ppt by LLL pneumonia, he responded to in hosp treatment & now back on his baseline med rx + Pred132md;  He feels that the NEBS helped him in the hosp but he's not using his NEB at  home-- I proposed that he do breathing treatments Qid (on a B/L/D/Bed schedule, alternating Sym/ Neb+Spiriva/ Sym/ Neb);  Finally the BNP is up & Bicarb level 35-- I propose starting Diamox 250/d & we will f/u labs in 3-4weeks...   ~  April 10, 2015:  56m63moV w/ SN>  Norm returns & states that he's feeling better on regimen we outlined last visit-- using NEBS, Symbicort, Spiriva, Pred10, Oxygen;  He denies much cough, sput, no hemoptysis, stable DOE, and no CP etc... He has very severe GOLD stage 4 COPD/ emphysema w/ FEV1=900cc in 2008;  Other medical problems as noted...  EXAM reveals Afeb, VSS, O2sat=96% on oxygen;  Heent- neg, mallampati1;  Chest- hyper-resonant, decr BS, clear w/o w/r/r or consolidation;  Heart- epig PMI, RR w/o m/r/g;  Abd- thin, meg;  Ext- neg, w/o c/c/e...   LABS 04/10/15:  [place]- HCO3=37, K=4.7, Cr=0.93;  BNP=63;  LFTs= wnl and Alb ret to norm at 4.2 (nutrition better)... Note: A1AT level= 142, Phenotype=M1M2 IMP/PLAN>>  We  discussed the spacing of his NEBS (asked to incr to Tid around meals, and intersperse w/ Symbicort Bid & Spiriva Qd- this way he is taking a regular medication 6 x per day & he likes it this way, every 2-3H & his breathing is better;  We checked labs and Bicarb level=37 on Diamox250, not on Lasix & BNP=63, rec to continue same for now;  He is on Pred33m/d & we discussed decr 1/2 step to 133malt w/ 66m56mod... He wants to wait 42mo68mo ROV & advised to call in the interim prn problems...     Past Medical History  Diagnosis Date  . Emphysema   . Hypertension   . Coronary artery disease   . Thyroid disease   . Glaucoma   . Hypercholesterolemia   . COPD (chronic obstructive pulmonary disease)     Past Surgical History  Procedure Laterality Date  . Back surgery  1982  . Cataract extraction  1990    bilateral  . Tonsillectomy  1994  . Wisdom tooth extraction  1960  . Vasectomy  1970  . Coronary angioplasty with stent placement      Outpatient  Encounter Prescriptions as of 04/10/2015  Medication Sig  . acetaZOLAMIDE (DIAMOX) 250 MG tablet 1 tablet every morning  . albuterol (PROAIR HFA) 108 (90 BASE) MCG/ACT inhaler Inhale 2 puffs into the lungs every 4 (four) hours as needed for shortness of breath.  . alMarland Kitchenuterol (PROVENTIL) (2.5 MG/3ML) 0.083% nebulizer solution Take 3 mLs (2.5 mg total) by nebulization 2 (two) times daily. DX J44.1  . aspirin 81 MG tablet Take 81 mg by mouth daily.    . bimatoprost (LUMIGAN) 0.03 % ophthalmic solution Place 1 drop into both eyes at bedtime.  . brinzolamide (AZOPT) 1 % ophthalmic suspension Place 1 drop into both eyes 3 (three) times daily.  . budesonide-formoterol (SYMBICORT) 160-4.5 MCG/ACT inhaler Inhale 2 puffs into the lungs 2 (two) times daily.  . Calcium Carbonate-Vitamin D (CALCIUM 600-D) 600-400 MG-UNIT per tablet Take 1 tablet by mouth daily.   . cholecalciferol (VITAMIN D) 1000 UNITS tablet Take 1,000 Units by mouth daily.  . diMarland Kitchentiazem (CARDIZEM) 120 MG tablet Take 120 mg by mouth daily.    . leMarland Kitchenothyroxine (SYNTHROID, LEVOTHROID) 100 MCG tablet Take 100 mcg by mouth daily.    . liMarland Kitcheninopril (PRINIVIL,ZESTRIL) 20 MG tablet Take 40 mg by mouth daily.   . NON FORMULARY Oxygen 3.5p-4 Liters daily.  . predniSONE (DELTASONE) 10 MG tablet Once daily  . Probiotic Product (PROBIOTIC PO) Take 1 tablet by mouth daily.  . Tiotropium Bromide Monohydrate (SPIRIVA RESPIMAT) 2.5 MCG/ACT AERS Take 1 puff by mouth daily.  . [DISCONTINUED] predniSONE (DELTASONE) 10 MG tablet Once daily  . [DISCONTINUED] SPIRIVA RESPIMAT 2.5 MCG/ACT AERS INHALE 2 PUFFS ONCE DAILY AS DIRECTED.  . [DISCONTINUED] SYMBICORT 160-4.5 MCG/ACT inhaler INHALE 2 PUFFS TWICE DAILY  . [DISCONTINUED] azithromycin (ZITHROMAX) 500 MG tablet Take 1 tablet (500 mg total) by mouth daily. (Patient not taking: Reported on 03/08/2015)  . [DISCONTINUED] cephALEXin (KEFLEX) 500 MG capsule Take 1 capsule (500 mg total) by mouth 4 (four) times daily.  (Patient not taking: Reported on 03/08/2015)   No facility-administered encounter medications on file as of 04/10/2015.    Allergies  Allergen Reactions  . Benazepril Hcl     REACTION: sensitive to sunlight  . Levofloxacin     REACTION: tendonitis    Immunization History  Administered Date(s) Administered  . Influenza Split 05/16/2012, 06/17/2013  . Influenza  Whole 05/17/2009, 04/24/2010, 05/18/2011  . Influenza,inj,Quad PF,36+ Mos 04/20/2014  . Pneumococcal Polysaccharide-23 05/17/2009    Current Medications, Allergies, Past Medical History, Past Surgical History, Family History, and Social History were reviewed in [employer] record.   Review of Systems  Constitutional: Negative for fever and unexpected weight change.  HENT: Negative for congestion, dental problem, ear pain, nosebleeds, postnasal drip, rhinorrhea, sinus pressure, sneezing, sore throat and trouble swallowing.   Eyes: Negative for redness and itching.  Respiratory: Positive for chest tightness and shortness of breath. Negative for wheezing.   Cardiovascular: Negative for palpitations and leg swelling.  Gastrointestinal: Negative for nausea and vomiting.  Genitourinary: Negative for dysuria.  Musculoskeletal: Negative for joint swelling.  Skin: Negative for rash.  Neurological: Negative for headaches.  Hematological: Does not bruise/bleed easily.  Psychiatric/Behavioral: Negative for dysphoric mood. The patient is not nervous/anxious.       Objective:   Physical Exam  Thin male in no acute distress Nose without purulence or discharge noted Neck without lymphadenopathy or thyromegaly Chest with very diminished breath sounds diffusely, cannot augment BS voluntarily, no active wheezing Cardiac exam with distant heart sounds, epig PMI, RR w/ gr1/6SEM no rubs or gallops... Lower extremities without edema, no cyanosis Alert and oriented, moves all 4 extremities.     Assessment & Plan:      IMP >>       End-stage COPD/ emphysema>     Chronic hypoxemic resp failure>     COPD exac and faint LLL pneumonia 7/16 hosp>     Other medical issues as noted>  PLAN >>  7/16> Brendan Herring has very severe end-stage Emphysema-- presented w/ acute exac ppt by LLL pneumonia, he responded to in hosp treatment & now back on his baseline med rx + Pred24m/d;  He feels that the NEBS helped him in the hosp but he's not using his NEB at home-- I proposed that he do breathing treatments Qid (on a B/L/D/Bed schedule, alternating Sym/ Neb+Spiriva/ Sym/ Neb);  Finally the BNP is up & Bicarb level 35-- I propose starting Diamox 250/d & we will f/u labs in 3-4weeks...  8/16> We discussed the spacing of his NEBS (asked to incr to Tid around meals, and intersperse w/ Symbicort Bid & Spiriva Qd- this way he is taking a regular medication 6 x per day & he likes it this way, every 2-3H & his breathing is better;  We checked labs and Bicarb level=37 on Diamox250, not on Lasix & BNP=63, rec to continue same for now;  He is on Pred196md & we discussed decr 1/2 step to 1043mlt w/ 5mg107md... He wants to wait 51mo 24moROV & advised to call in the interim prn problems   Patient's Medications  New Prescriptions   No medications on file  Previous Medications   ACETAZOLAMIDE (DIAMOX) 250 MG TABLET    1 tablet every morning   ALBUTEROL (PROAIR HFA) 108 (90 BASE) MCG/ACT INHALER    Inhale 2 puffs into the lungs every 4 (four) hours as needed for shortness of breath.   ALBUTEROL (PROVENTIL) (2.5 MG/3ML) 0.083% NEBULIZER SOLUTION    Take 3 mLs (2.5 mg total) by nebulization 2 (two) times daily. DX J44.1   ASPIRIN 81 MG TABLET    Take 81 mg by mouth daily.     BIMATOPROST (LUMIGAN) 0.03 % OPHTHALMIC SOLUTION    Place 1 drop into both eyes at bedtime.   BRINZOLAMIDE (AZOPT) 1 % OPHTHALMIC SUSPENSION  Place 1 drop into both eyes 3 (three) times daily.   CALCIUM CARBONATE-VITAMIN D (CALCIUM 600-D) 600-400 MG-UNIT PER TABLET     Take 1 tablet by mouth daily.    CHOLECALCIFEROL (VITAMIN D) 1000 UNITS TABLET    Take 1,000 Units by mouth daily.   DILTIAZEM (CARDIZEM) 120 MG TABLET    Take 120 mg by mouth daily.     LEVOTHYROXINE (SYNTHROID, LEVOTHROID) 100 MCG TABLET    Take 100 mcg by mouth daily.     LISINOPRIL (PRINIVIL,ZESTRIL) 20 MG TABLET    Take 40 mg by mouth daily.    NON FORMULARY    Oxygen 3.5p-4 Liters daily.   PROBIOTIC PRODUCT (PROBIOTIC PO)    Take 1 tablet by mouth daily.  Modified Medications   Modified Medication Previous Medication   BUDESONIDE-FORMOTEROL (SYMBICORT) 160-4.5 MCG/ACT INHALER SYMBICORT 160-4.5 MCG/ACT inhaler      Inhale 2 puffs into the lungs 2 (two) times daily.    INHALE 2 PUFFS TWICE DAILY   PREDNISONE (DELTASONE) 10 MG TABLET predniSONE (DELTASONE) 10 MG tablet      Once daily    Once daily   TIOTROPIUM BROMIDE MONOHYDRATE (SPIRIVA RESPIMAT) 2.5 MCG/ACT AERS SPIRIVA RESPIMAT 2.5 MCG/ACT AERS      Take 1 puff by mouth daily.    INHALE 2 PUFFS ONCE DAILY AS DIRECTED.  Discontinued Medications   AZITHROMYCIN (ZITHROMAX) 500 MG TABLET    Take 1 tablet (500 mg total) by mouth daily.   CEPHALEXIN (KEFLEX) 500 MG CAPSULE    Take 1 capsule (500 mg total) by mouth 4 (four) times daily.

## 2015-04-15 LAB — ALPHA-1 ANTITRYPSIN PHENOTYPE: A1 ANTITRYPSIN: 142 mg/dL (ref 83–199)

## 2015-05-01 ENCOUNTER — Ambulatory Visit: Payer: Medicare Other | Admitting: Pulmonary Disease

## 2015-05-09 ENCOUNTER — Telehealth: Payer: Self-pay | Admitting: Pulmonary Disease

## 2015-05-09 DIAGNOSIS — J432 Centrilobular emphysema: Secondary | ICD-10-CM

## 2015-05-09 NOTE — Telephone Encounter (Signed)
Called spoke with pt. He is requesting for an order to be sent to [facility] for a mask that he uses for his nebulizer. He reports he isn't able to use the mouth piece that normally goes with this. Please advise SN if okay to do so? thanks

## 2015-05-09 NOTE — Telephone Encounter (Signed)
Order has been placed. Nothing further needed. 

## 2015-05-09 NOTE — Telephone Encounter (Signed)
Per SN, ok to order

## 2015-05-17 DIAGNOSIS — H4420 Degenerative myopia, unspecified eye: Secondary | ICD-10-CM | POA: Insufficient documentation

## 2015-07-08 ENCOUNTER — Other Ambulatory Visit: Payer: Self-pay | Admitting: Pulmonary Disease

## 2015-07-10 ENCOUNTER — Inpatient Hospital Stay (HOSPITAL_COMMUNITY)
Admission: EM | Admit: 2015-07-10 | Discharge: 2015-07-13 | DRG: 189 | Disposition: A | Discharge status: Skilled Nursing Facility | Payer: Medicare Other | Attending: Internal Medicine | Admitting: Internal Medicine

## 2015-07-10 ENCOUNTER — Encounter (HOSPITAL_COMMUNITY): Payer: Self-pay | Admitting: Emergency Medicine

## 2015-07-10 ENCOUNTER — Emergency Department (HOSPITAL_COMMUNITY): Payer: Medicare Other

## 2015-07-10 DIAGNOSIS — J441 Chronic obstructive pulmonary disease with (acute) exacerbation: Secondary | ICD-10-CM | POA: Diagnosis not present

## 2015-07-10 DIAGNOSIS — Z955 Presence of coronary angioplasty implant and graft: Secondary | ICD-10-CM

## 2015-07-10 DIAGNOSIS — E43 Unspecified severe protein-calorie malnutrition: Secondary | ICD-10-CM | POA: Diagnosis present

## 2015-07-10 DIAGNOSIS — J439 Emphysema, unspecified: Secondary | ICD-10-CM | POA: Diagnosis present

## 2015-07-10 DIAGNOSIS — H409 Unspecified glaucoma: Secondary | ICD-10-CM | POA: Diagnosis present

## 2015-07-10 DIAGNOSIS — J9621 Acute and chronic respiratory failure with hypoxia: Secondary | ICD-10-CM | POA: Diagnosis not present

## 2015-07-10 DIAGNOSIS — J432 Centrilobular emphysema: Secondary | ICD-10-CM

## 2015-07-10 DIAGNOSIS — I4891 Unspecified atrial fibrillation: Secondary | ICD-10-CM | POA: Diagnosis present

## 2015-07-10 DIAGNOSIS — Z7952 Long term (current) use of systemic steroids: Secondary | ICD-10-CM

## 2015-07-10 DIAGNOSIS — Z681 Body mass index (BMI) 19 or less, adult: Secondary | ICD-10-CM

## 2015-07-10 DIAGNOSIS — R040 Epistaxis: Secondary | ICD-10-CM | POA: Diagnosis not present

## 2015-07-10 DIAGNOSIS — J9610 Chronic respiratory failure, unspecified whether with hypoxia or hypercapnia: Secondary | ICD-10-CM

## 2015-07-10 DIAGNOSIS — R030 Elevated blood-pressure reading, without diagnosis of hypertension: Secondary | ICD-10-CM | POA: Diagnosis not present

## 2015-07-10 DIAGNOSIS — Z66 Do not resuscitate: Secondary | ICD-10-CM | POA: Diagnosis present

## 2015-07-10 DIAGNOSIS — R0600 Dyspnea, unspecified: Secondary | ICD-10-CM | POA: Diagnosis not present

## 2015-07-10 DIAGNOSIS — R000 Tachycardia, unspecified: Secondary | ICD-10-CM

## 2015-07-10 DIAGNOSIS — Z87891 Personal history of nicotine dependence: Secondary | ICD-10-CM

## 2015-07-10 DIAGNOSIS — J9601 Acute respiratory failure with hypoxia: Secondary | ICD-10-CM | POA: Diagnosis present

## 2015-07-10 DIAGNOSIS — I2510 Atherosclerotic heart disease of native coronary artery without angina pectoris: Secondary | ICD-10-CM | POA: Diagnosis present

## 2015-07-10 DIAGNOSIS — I10 Essential (primary) hypertension: Secondary | ICD-10-CM | POA: Diagnosis present

## 2015-07-10 DIAGNOSIS — IMO0001 Reserved for inherently not codable concepts without codable children: Secondary | ICD-10-CM

## 2015-07-10 DIAGNOSIS — E46 Unspecified protein-calorie malnutrition: Secondary | ICD-10-CM

## 2015-07-10 DIAGNOSIS — E039 Hypothyroidism, unspecified: Secondary | ICD-10-CM | POA: Diagnosis present

## 2015-07-10 DIAGNOSIS — R739 Hyperglycemia, unspecified: Secondary | ICD-10-CM | POA: Diagnosis present

## 2015-07-10 LAB — COMPREHENSIVE METABOLIC PANEL
ALK PHOS: 62 U/L (ref 38–126)
ALT: 14 U/L — ABNORMAL LOW (ref 17–63)
ANION GAP: 5 (ref 5–15)
AST: 15 U/L (ref 15–41)
Albumin: 3.1 g/dL — ABNORMAL LOW (ref 3.5–5.0)
BUN: 16 mg/dL (ref 6–20)
CALCIUM: 9.3 mg/dL (ref 8.9–10.3)
CHLORIDE: 102 mmol/L (ref 101–111)
CO2: 37 mmol/L — AB (ref 22–32)
Creatinine, Ser: 0.8 mg/dL (ref 0.61–1.24)
GFR calc non Af Amer: >60 mL/min (ref >60)
Glucose, Bld: 181 mg/dL — ABNORMAL HIGH (ref 65–99)
POTASSIUM: 3.5 mmol/L (ref 3.5–5.1)
SODIUM: 144 mmol/L (ref 135–145)
Total Bilirubin: 0.5 mg/dL (ref 0.3–1.2)
Total Protein: 6.1 g/dL — ABNORMAL LOW (ref 6.5–8.1)

## 2015-07-10 LAB — CBC WITH DIFFERENTIAL/PLATELET
BASOS ABS: 0 10*3/uL (ref 0.0–0.1)
BASOS PCT: 0 %
Basophils Absolute: 0.1 10*3/uL (ref 0.0–0.1)
Basophils Relative: 1 %
EOS ABS: 0.4 10*3/uL (ref 0.0–0.7)
EOS PCT: 4 %
Eosinophils Absolute: 0 10*3/uL (ref 0.0–0.7)
Eosinophils Relative: 0 %
HCT: 37.7 % — ABNORMAL LOW (ref 39.0–52.0)
HEMATOCRIT: 39.6 % (ref 39.0–52.0)
Hemoglobin: 11.8 g/dL — ABNORMAL LOW (ref 13.0–17.0)
Hemoglobin: 12.4 g/dL — ABNORMAL LOW (ref 13.0–17.0)
LYMPHS ABS: 2 10*3/uL (ref 0.7–4.0)
Lymphocytes Relative: 17 %
Lymphocytes Relative: 4 %
Lymphs Abs: 0.6 10*3/uL — ABNORMAL LOW (ref 0.7–4.0)
MCH: 33.6 pg (ref 26.0–34.0)
MCH: 33.6 pg (ref 26.0–34.0)
MCHC: 31.3 g/dL (ref 30.0–36.0)
MCHC: 31.3 g/dL (ref 30.0–36.0)
MCV: 107.4 fL — ABNORMAL HIGH (ref 78.0–100.0)
MCV: 107.3 fL — ABNORMAL HIGH (ref 78.0–100.0)
MONO ABS: 1.7 10*3/uL — AB (ref 0.1–1.0)
MONO ABS: 0.3 10*3/uL (ref 0.1–1.0)
MONOS PCT: 14 %
Monocytes Relative: 2 %
NEUTROS ABS: 14.4 10*3/uL — AB (ref 1.7–7.7)
Neutro Abs: 8 10*3/uL — ABNORMAL HIGH (ref 1.7–7.7)
Neutrophils Relative %: 66 %
Neutrophils Relative %: 94 %
PLATELETS: 313 10*3/uL (ref 150–400)
PLATELETS: 356 10*3/uL (ref 150–400)
RBC: 3.51 MIL/uL — ABNORMAL LOW (ref 4.22–5.81)
RBC: 3.69 MIL/uL — AB (ref 4.22–5.81)
RDW: 12.4 % (ref 11.5–15.5)
RDW: 12.4 % (ref 11.5–15.5)
WBC: 12.2 10*3/uL — ABNORMAL HIGH (ref 4.0–10.5)
WBC: 15.3 10*3/uL — AB (ref 4.0–10.5)

## 2015-07-10 LAB — GLUCOSE, CAPILLARY
GLUCOSE-CAPILLARY: 211 mg/dL — AB (ref 65–99)
Glucose-Capillary: 216 mg/dL — ABNORMAL HIGH (ref 65–99)
Glucose-Capillary: 227 mg/dL — ABNORMAL HIGH (ref 65–99)

## 2015-07-10 LAB — CBC
HEMATOCRIT: 40 % (ref 39.0–52.0)
Hemoglobin: 12.6 g/dL — ABNORMAL LOW (ref 13.0–17.0)
MCH: 33.6 pg (ref 26.0–34.0)
MCHC: 31.5 g/dL (ref 30.0–36.0)
MCV: 106.7 fL — AB (ref 78.0–100.0)
PLATELETS: 366 10*3/uL (ref 150–400)
RBC: 3.75 MIL/uL — ABNORMAL LOW (ref 4.22–5.81)
RDW: 12.4 % (ref 11.5–15.5)
WBC: 16.1 10*3/uL — ABNORMAL HIGH (ref 4.0–10.5)

## 2015-07-10 LAB — APTT: aPTT: 39 seconds — ABNORMAL HIGH (ref 24–37)

## 2015-07-10 LAB — MRSA PCR SCREENING: MRSA by PCR: NEGATIVE

## 2015-07-10 LAB — SAMPLE TO BLOOD BANK

## 2015-07-10 LAB — PROTIME-INR
INR: 1.03 (ref 0.00–1.49)
PROTHROMBIN TIME: 13.7 s (ref 11.6–15.2)

## 2015-07-10 MED ORDER — HYDRALAZINE HCL 20 MG/ML IJ SOLN: 10.0000 mg | Freq: Four times a day (QID) | INTRAMUSCULAR | Status: DC | PRN

## 2015-07-10 MED ORDER — ALBUTEROL SULFATE (2.5 MG/3ML) 0.083% IN NEBU: 2.5000 mg | INHALATION_SOLUTION | RESPIRATORY_TRACT | Status: DC | PRN

## 2015-07-10 MED ORDER — BRINZOLAMIDE 1 % OP SUSP: 1.0000 [drp] | Freq: Three times a day (TID) | OPHTHALMIC | Status: DC

## 2015-07-10 MED ORDER — CLINDAMYCIN PHOSPHATE 600 MG/50ML IV SOLN
600.0000 mg | Freq: Three times a day (TID) | INTRAVENOUS | Status: DC
  Administered 2015-07-10 – 2015-07-13 (×8): 600 mg via INTRAVENOUS
  Filled 2015-07-10 (×9): qty 50

## 2015-07-10 MED ORDER — IPRATROPIUM-ALBUTEROL 0.5-2.5 (3) MG/3ML IN SOLN
3.0000 mL | Freq: Once | RESPIRATORY_TRACT | Status: AC
  Administered 2015-07-10: 3 mL via RESPIRATORY_TRACT
  Filled 2015-07-10: qty 3

## 2015-07-10 MED ORDER — BRIMONIDINE TARTRATE 0.2 % OP SOLN
1.0000 [drp] | Freq: Two times a day (BID) | OPHTHALMIC | Status: DC
  Administered 2015-07-10 – 2015-07-13 (×7): 1 [drp] via OPHTHALMIC
  Filled 2015-07-10: qty 5

## 2015-07-10 MED ORDER — IPRATROPIUM-ALBUTEROL 0.5-2.5 (3) MG/3ML IN SOLN
3.0000 mL | RESPIRATORY_TRACT | Status: DC
  Administered 2015-07-10 – 2015-07-12 (×14): 3 mL via RESPIRATORY_TRACT
  Filled 2015-07-10 (×14): qty 3

## 2015-07-10 MED ORDER — HYDROCODONE-ACETAMINOPHEN 5-325 MG PO TABS: 1.0000 | ORAL_TABLET | ORAL | Status: DC | PRN

## 2015-07-10 MED ORDER — CLINDAMYCIN PHOSPHATE 600 MG/50ML IV SOLN
600.0000 mg | Freq: Once | INTRAVENOUS | Status: AC
  Administered 2015-07-10: 600 mg via INTRAVENOUS
  Filled 2015-07-10: qty 50

## 2015-07-10 MED ORDER — SODIUM CHLORIDE 0.9 % IJ SOLN
3.0000 mL | Freq: Two times a day (BID) | INTRAMUSCULAR | Status: DC
  Administered 2015-07-10 – 2015-07-13 (×6): 3 mL via INTRAVENOUS

## 2015-07-10 MED ORDER — METHYLPREDNISOLONE SODIUM SUCC 125 MG IJ SOLR
60.0000 mg | Freq: Three times a day (TID) | INTRAMUSCULAR | Status: DC
  Administered 2015-07-10 – 2015-07-12 (×6): 60 mg via INTRAVENOUS
  Filled 2015-07-10: qty 0.96
  Filled 2015-07-10 (×2): qty 2
  Filled 2015-07-10 (×2): qty 0.96
  Filled 2015-07-10 (×2): qty 2

## 2015-07-10 MED ORDER — LATANOPROST 0.005 % OP SOLN: 1.0000 [drp] | Freq: Every day | OPHTHALMIC | Status: DC

## 2015-07-10 MED ORDER — CALCIUM CARBONATE-VITAMIN D 500-200 MG-UNIT PO TABS
1.0000 | ORAL_TABLET | Freq: Every day | ORAL | Status: DC
  Administered 2015-07-13: 1 via ORAL
  Filled 2015-07-10 (×3): qty 1

## 2015-07-10 MED ORDER — LATANOPROST 0.005 % OP SOLN
1.0000 [drp] | Freq: Every day | OPHTHALMIC | Status: DC
  Administered 2015-07-10 – 2015-07-12 (×3): 1 [drp] via OPHTHALMIC
  Filled 2015-07-10: qty 2.5

## 2015-07-10 MED ORDER — DILTIAZEM HCL ER 120 MG PO CP24
120.0000 mg | ORAL_CAPSULE | Freq: Every day | ORAL | Status: DC
  Administered 2015-07-11 – 2015-07-13 (×3): 120 mg via ORAL
  Filled 2015-07-10 (×3): qty 1

## 2015-07-10 MED ORDER — CLINDAMYCIN PHOSPHATE 600 MG/50ML IV SOLN: 600.0000 mg | Freq: Three times a day (TID) | INTRAVENOUS | Status: DC

## 2015-07-10 MED ORDER — DILTIAZEM HCL ER 120 MG PO CP24
120.0000 mg | ORAL_CAPSULE | Freq: Every day | ORAL | Status: DC
  Administered 2015-07-10: 120 mg via ORAL
  Filled 2015-07-10: qty 1

## 2015-07-10 MED ORDER — ACETAMINOPHEN 650 MG RE SUPP
650.0000 mg | Freq: Four times a day (QID) | RECTAL | Status: DC | PRN
Start: 2015-07-10 — End: 2015-07-13

## 2015-07-10 MED ORDER — ONDANSETRON HCL 4 MG/2ML IJ SOLN: 4.0000 mg | Freq: Four times a day (QID) | INTRAMUSCULAR | Status: DC | PRN

## 2015-07-10 MED ORDER — ACETAMINOPHEN 325 MG PO TABS: 650.0000 mg | ORAL_TABLET | Freq: Four times a day (QID) | ORAL | Status: DC | PRN

## 2015-07-10 MED ORDER — METHYLPREDNISOLONE SODIUM SUCC 125 MG IJ SOLR
125.0000 mg | Freq: Once | INTRAMUSCULAR | Status: AC
Start: 2015-07-10 — End: 2015-07-10
  Administered 2015-07-10: 125 mg via INTRAVENOUS
  Filled 2015-07-10: qty 2

## 2015-07-10 MED ORDER — ONDANSETRON HCL 4 MG PO TABS: 4.0000 mg | ORAL_TABLET | Freq: Four times a day (QID) | ORAL | Status: DC | PRN

## 2015-07-10 MED ORDER — OXYMETAZOLINE HCL 0.05 % NA SOLN
1.0000 | Freq: Once | NASAL | Status: AC
  Administered 2015-07-10: 1 via NASAL

## 2015-07-10 MED ORDER — INSULIN ASPART 100 UNIT/ML ~~LOC~~ SOLN
0.0000 [IU] | Freq: Three times a day (TID) | SUBCUTANEOUS | Status: DC
  Administered 2015-07-10: 3 [IU] via SUBCUTANEOUS
  Administered 2015-07-11: 2 [IU] via SUBCUTANEOUS
  Administered 2015-07-11: 3 [IU] via SUBCUTANEOUS
  Administered 2015-07-11: 2 [IU] via SUBCUTANEOUS
  Administered 2015-07-12: 3 [IU] via SUBCUTANEOUS

## 2015-07-10 MED ORDER — LEVOTHYROXINE SODIUM 112 MCG PO TABS
112.0000 ug | ORAL_TABLET | Freq: Every day | ORAL | Status: DC
  Administered 2015-07-11 – 2015-07-13 (×3): 112 ug via ORAL
  Filled 2015-07-10 (×4): qty 1

## 2015-07-10 MED ORDER — ALBUTEROL (5 MG/ML) CONTINUOUS INHALATION SOLN
10.0000 mg/h | INHALATION_SOLUTION | Freq: Once | RESPIRATORY_TRACT | Status: AC
  Administered 2015-07-10: 10 mg/h via RESPIRATORY_TRACT

## 2015-07-10 MED ORDER — HYDRALAZINE HCL 20 MG/ML IJ SOLN
10.0000 mg | Freq: Once | INTRAMUSCULAR | Status: AC
  Administered 2015-07-10: 10 mg via INTRAVENOUS
  Filled 2015-07-10: qty 1

## 2015-07-10 NOTE — H&P (Signed)
[facility] History and Physical  Brendan Herring ZOX:[id number] DOB: 23-Jun-1941 DOA: 07/10/2015  Referring physician: Emergency Department PCP:  Duane Lope, MD   CHIEF COMPLAINT:  Dyspnea, end stage COPD, nosebleed   HPI: Brendan Herring is a 74 y.o. male with end stage COPD on 5 L oxygen per [place] at home . Patient woke up at 5:30 this morning and with a massive right sided nosebleed. This is his second major nosebleed in 3 months (last time left nare). Patient is not anticoagulated.   Patient denies cough, chills, or fevers.   ED COURSE:   Hypertensive, tachycardic. Poor air movement. Clot removed from right nare then Rhino packing placed. Patient given Solu-Medrol and nebulizer and placed on a nonrebreather.  LABS:  Wbc 12.2, hemoglobin 11.8,  bicarbonate 37, glucose 181,  INR 1.03.   CXR:   Severe COPD  EKG:    Sinus or ectopic atrial tachycardia Ventricular premature complex Borderline right axis deviation narrow complex tachycardia w substantial artefact. SR versus ectopic origin Premature ventricular complexes Abnormal ekg Confirmed by Gerhard Munch MD [phone]) on 02/25/2015 4:43:19 PM   Medications  diltiazem (DILACOR XR) 24 hr capsule 120 mg (120 mg Oral Given 07/10/15 0848)  hydrALAZINE (APRESOLINE) injection 10 mg (not administered)  oxymetazoline (AFRIN) 0.05 % nasal spray 1 spray (1 spray Each Nare Given 07/10/15 0640)  methylPREDNISolone sodium succinate (SOLU-MEDROL) 125 mg/2 mL injection 125 mg (125 mg Intravenous Given 07/10/15 0748)  ipratropium-albuterol (DUONEB) 0.5-2.5 (3) MG/3ML nebulizer solution 3 mL (3 mLs Nebulization Given 07/10/15 0829)    Review of Systems  Constitutional: Negative.   HENT: Positive for nosebleeds.   Eyes: Negative.   Respiratory: Positive for shortness of breath.   Cardiovascular: Negative.   Gastrointestinal: Negative.   Genitourinary: Negative.   Musculoskeletal: Negative.   Skin: Negative.   Neurological: Negative.    Endo/Heme/Allergies: Negative.   Psychiatric/Behavioral: Negative.     Past Medical History  Diagnosis Date  . Emphysema   . Hypertension   . Coronary artery disease   . Thyroid disease   . Glaucoma   . Hypercholesterolemia   . COPD (chronic obstructive pulmonary disease) [facility])    Past Surgical History  Procedure Laterality Date  . Back surgery  1982  . Cataract extraction  1990    bilateral  . Tonsillectomy  1994  . Wisdom tooth extraction  1960  . Vasectomy  1970  . Coronary angioplasty with stent placement      SOCIAL HISTORY:  reports that he quit smoking about 20 years ago. His smoking use included Cigarettes. He has a 35 pack-year smoking history. He does not have any smokeless tobacco history on file. He reports that he does not drink alcohol or use illicit drugs. Lives:   At [facility] [address]   Assistive devices:   Rolling walker PRN.   Allergies  Allergen Reactions  . Benazepril Hcl     REACTION: sensitive to sunlight  . Levofloxacin     REACTION: tendonitis    Family History  Problem Relation Age of Onset  . Emphysema Father   . Heart failure Father   . Stroke Father   . Heart disease Father   . Heart failure Brother   . Heart disease Mother     Prior to Admission medications   Medication Sig Start Date End Date Taking? Authorizing Provider  acetaZOLAMIDE (DIAMOX) 250 MG tablet TAKE 1 TABLET IN THE MORNING. 07/09/15   Michele Mcalpine, MD  albuterol [facility] HFA)  108 (90 BASE) MCG/ACT inhaler Inhale 2 puffs into the lungs every 4 (four) hours as needed for shortness of breath. 02/28/15   Elease Etienne, MD  albuterol (PROVENTIL) (2.5 MG/3ML) 0.083% nebulizer solution Take 3 mLs (2.5 mg total) by nebulization 2 (two) times daily. DX J44.1 03/08/15   Michele Mcalpine, MD  aspirin 81 MG tablet Take 81 mg by mouth daily.      Historical Provider, MD  bimatoprost (LUMIGAN) 0.03 % ophthalmic solution Place 1 drop into both eyes at bedtime.    Historical Provider, MD    brinzolamide (AZOPT) 1 % ophthalmic suspension Place 1 drop into both eyes 3 (three) times daily.    Historical Provider, MD  budesonide-formoterol (SYMBICORT) 160-4.5 MCG/ACT inhaler Inhale 2 puffs into the lungs 2 (two) times daily. 04/10/15   Michele Mcalpine, MD  Calcium Carbonate-Vitamin D (CALCIUM 600-D) 600-400 MG-UNIT per tablet Take 1 tablet by mouth daily.     Historical Provider, MD  cholecalciferol (VITAMIN D) 1000 UNITS tablet Take 1,000 Units by mouth daily.    Historical Provider, MD  diltiazem (CARDIZEM) 120 MG tablet Take 120 mg by mouth daily.      Historical Provider, MD  levothyroxine (SYNTHROID, LEVOTHROID) 100 MCG tablet Take 100 mcg by mouth daily.      Historical Provider, MD  lisinopril (PRINIVIL,ZESTRIL) 20 MG tablet Take 40 mg by mouth daily.     Historical Provider, MD  NON FORMULARY Oxygen 3.5p-4 Liters daily.    Historical Provider, MD  predniSONE (DELTASONE) 10 MG tablet Once daily 04/10/15   Michele Mcalpine, MD  Probiotic Product (PROBIOTIC PO) Take 1 tablet by mouth daily.    Historical Provider, MD  Tiotropium Bromide Monohydrate (SPIRIVA RESPIMAT) 2.5 MCG/ACT AERS Take 1 puff by mouth daily. 04/10/15   Michele Mcalpine, MD   PHYSICAL EXAM: Filed Vitals:   07/10/15 0830 07/10/15 0900 07/10/15 0930 07/10/15 0945  BP: 170/107 203/94 174/94 143/102  Pulse: 79 122 80 120  Resp: SpO2: 100% 96% 97% 98%    Wt Readings from Last 3 Encounters:  04/10/15 60.147 kg (132 lb 9.6 oz)  03/08/15 58.514 kg (129 lb)  02/25/15 59.421 kg (131 lb)    General:  Pleasant, thin male. Appears calm and comfortable Eyes: PER, normal lids, irises & conjunctiva ENT: grossly normal hearing, lips & tongue Neck: no LAD, no masses Cardiovascular: irregular rate and rhythm. No LE edema.  Respiratory: Respirations labored, Severely diminished breath sounds in bilateral upper and lower lobes. No wheezing heard. .   Abdomen: soft, non-distended, non-tender, active bowel sounds. No  obvious masses.  Skin: no rash seen on limited exam Musculoskeletal: grossly normal tone BUE/BLE Psychiatric: grossly normal mood and affect, speech fluent and appropriate Neurologic: grossly non-focal.         LABS ON ADMISSION:    Basic Metabolic Panel:  Recent Labs Lab 07/10/15 0647  NA 144  K 3.5  CL 102  CO2 37*  GLUCOSE 181*  BUN 16  CREATININE 0.80  CALCIUM 9.3   Liver Function Tests:  Recent Labs Lab 07/10/15 0647  AST 15  ALT 14*  ALKPHOS 62  BILITOT 0.5  PROT 6.1*  ALBUMIN 3.1*    CBC:  Recent Labs Lab 07/10/15 0647  WBC 12.2*  NEUTROABS 8.0*  HGB 11.8*  HCT 37.7*  MCV 107.4*  PLT 313   ProBNP (last 3 results)  Recent Labs  04/10/15 1228  PROBNP 63.0  Creatinine clearance cannot be calculated (Unknown ideal weight.)  Radiological Exams on Admission: Dg Chest Port 1 View  07/10/2015  CLINICAL DATA:  Shortness of breath. COPD. Acute respiratory failure with hypoxemia. EXAM: PORTABLE CHEST 1 VIEW COMPARISON:  03/08/2015 FINDINGS: Heart size is normal. Severe pulmonary hyperinflation again seen, consistent with COPD. No evidence of pulmonary infiltrate or pleural effusion. Probable skin fold seen overlying peripheral right upper lung field. IMPRESSION: Severe COPD.  No acute findings. Electronically Signed   By: Myles RosenthalJohn  Stahl M.D.   On: 07/10/2015 07:54    ASSESSMENT / PLAN   COPD exacerbation -Admit to stepdown -Placed pack on non-rebreather mask for desaturation. May need Bipap, will consult CCM  -Scheduled and prn Nebulizers -IV steroids -antibiotics  Epistaxis, right nare. Spontaneous and in absence of anti-coagulation. By history the bleeding was significant. Hgb stable in 11 range, repeat CBC pending.  Clot extracted and right nare packed by EDP and no further bleeding. This is second significant nosebleed in 3 months (last time left nare). EDP has consulted ENT.   Hypertension, SBP 160's -203.  -Home Cardizem dose given without  improvement. EDP ordering dose of Hydralazine.  -continue home anti-hypertensives / diuretics -prn Hydralazine.   Atrial fibrillation with RVR. No previous history of atrial fibrillation -monitor on telemetry.  -May need cardizem drip if doesn't resolve with treatment of hypoexemia.   Hyperglycemia. History of steroid-induced hyperglycemia. Glucose 181 today. - Monitor CBGs and use SSI    Hypothyroidism -continue home Synthroid.     CONSULTANTS:   [facility]   ENT - EDP called  Code Status: DNR DVT Prophylaxis: SCDs only (active bleed) Family Communication:  Patient alert, oriented and understands plan of care.  Disposition Plan: Discharge back to [facility] in 2-3 days.    Time spent: 60 minutes Brendan ClusterPaula Rmani Kellogg  NP [facility] Pager [phone]

## 2015-07-10 NOTE — ED Provider Notes (Signed)
Patient seen and evaluated. Reevaluated 2. Discussed at length with Sarita BottomNicole Piscotta PA-C.  Patient has initial good control and hemostasis of his bleed. Diminished breath sounds and prolongation. Given albuterol neb and improved. However on reexam he worsens has globally diminished breath sounds increased worker breathing. Confirmed with patient and daughter that he is DO NOT RESUSCITATE. BiPAP initiated. Consult with medicine, critical care medicine, and ENT.  Rolland PorterMark Kadance Mccuistion, MD 07/10/15 1120

## 2015-07-10 NOTE — Progress Notes (Signed)
Pt. Was transported to 2M03 without any complications.  

## 2015-07-10 NOTE — Clinical Social Work Note (Signed)
CSW received call from Elsie LincolnKatie Osman with [facility] regarding patient. [facility] and if skilled rehab is needed at discharge, they have a bed for him.   Genelle BalVanessa Saul Fabiano, MSW, LCSW Licensed Clinical Social Worker Clinical Social Work Department [facility] [phone]

## 2015-07-10 NOTE — Progress Notes (Signed)
RT took patient off BIPAP and placed him on venturi mask at 24%. Patient is resting comfortably with O2 Sat of 99%. RT will continue to monitor.

## 2015-07-10 NOTE — ED Notes (Signed)
Attempted report to ICU, RN wants to talk with MD to see if pt is appropriate.

## 2015-07-10 NOTE — Progress Notes (Signed)
eLink Physician-Brief Progress Note Patient Name: Florene Glenorman L Snodgrass DOB: 06/06/1941 MRN: [id number]   Date of Service  07/10/2015  HPI/Events of Note  Hydralazine added for hypertension until able to restart home BP meds  eICU Interventions       Intervention Category Intermediate Interventions: Hypertension - evaluation and management  Lacharles Altschuler S. 07/10/2015, 5:12 PM

## 2015-07-10 NOTE — Progress Notes (Signed)
Initial Nutrition Assessment  DOCUMENTATION CODES:   Severe malnutrition in context of chronic illness  INTERVENTION:    Diet advancement per MD as breathing improves.  Recommend Ensure Enlive PO TID when diet advanced, each supplement provides 350 kcal and 20 grams of protein  NUTRITION DIAGNOSIS:   Malnutrition related to chronic illness as evidenced by severe depletion of body fat, severe depletion of muscle mass.  GOAL:   Patient will meet greater than or equal to 90% of their needs  MONITOR:   Diet advancement, PO intake, Supplement acceptance, Labs, Weight trends, I & O's  REASON FOR ASSESSMENT:   Consult COPD Protocol  ASSESSMENT:   [age] yom with PMH of COPD (GOLD Stage IV) on 5 L of NCO2 at home, HTN, CAD, and thyroid disease [facility] c/o epistaxis. Pt awoke at 0530 11/23 with massive Rt sided nosebleed (hx of same; second major nosebleed in 3 months). He is not on anticoagulation. In ED, clot was removed from right nare and rhino packing was placed by EDP. He was also noted to be tachycardic, hypertensive, and SOB. WOB increased during ED course eventually requiring BiPap.  Patient reports that he usually weighs ~134 lbs, the same as when he was in school. Nutrition-Focused physical exam completed. Findings are moderate-severe fat depletion, moderate-severe muscle depletion, and no edema. Patient with severe PCM. He drinks Ensure supplements once per day at home. Currently NPO due to need for BiPAP.   Diet Order:   NPO  Skin:  Reviewed, no issues  Last BM:  unknown  Height:   Ht Readings from Last 1 Encounters:  07/10/15 6\' 1"  (1.854 m)    Weight:   Wt Readings from Last 1 Encounters:  04/10/15 132 lb 9.6 oz (60.147 kg)    Ideal Body Weight:  83.6 kg  BMI:  17.4 (using most recent weight)  Estimated Nutritional Needs:   Kcal:  1900-2100  Protein:  85-100 gm  Fluid:  2 L  EDUCATION NEEDS:   No education needs identified at this  time  Joaquin CourtsKimberly Deundra Bard, RD, LDN, CNSC Pager [phone] After Hours Pager [phone]

## 2015-07-10 NOTE — ED Notes (Signed)
Respiratory called for transport.

## 2015-07-10 NOTE — ED Provider Notes (Signed)
CSN: [id number]     Arrival date & time 07/10/15  4098 History   None    Chief Complaint  Patient presents with  . Epistaxis     (Consider location/radiation/quality/duration/timing/severity/associated sxs/prior Treatment) HPI   Blood pressure 185/102, pulse 121, resp. rate 20, SpO2 99 %.  Brendan Herring is a 74 y.o. male brought in by EMS for nosebleed which woke the patient up in approximately 5 to 5:30 AM. Patient denies anticoagulation, states he's had a nasal infection recently. Denies chest pain or shortness of breath above his baseline (patient is on 5 L nasal cannula at all times), has history of nosebleeds which has never required ENT. Patient has chronic hypertension, he hasn't taken his medications this a.m.   Past Medical History  Diagnosis Date  . Emphysema   . Hypertension   . Coronary artery disease   . Thyroid disease   . Glaucoma   . Hypercholesterolemia   . COPD (chronic obstructive pulmonary disease) [facility])    Past Surgical History  Procedure Laterality Date  . Back surgery  1982  . Cataract extraction  1990    bilateral  . Tonsillectomy  1994  . Wisdom tooth extraction  1960  . Vasectomy  1970  . Coronary angioplasty with stent placement     Family History  Problem Relation Age of Onset  . Emphysema Father   . Heart failure Father   . Stroke Father   . Heart disease Father   . Heart failure Brother   . Heart disease Mother    Social History  Substance Use Topics  . Smoking status: Former Smoker -- 1.00 packs/day for 35 years    Types: Cigarettes    Quit date: 08/17/1994  . Smokeless tobacco: None  . Alcohol Use: No    Review of Systems  10 systems reviewed and found to be negative, except as noted in the HPI.   Allergies  Benazepril hcl and Levofloxacin  Home Medications   Prior to Admission medications   Medication Sig Start Date End Date Taking? Authorizing Provider  acetaZOLAMIDE (DIAMOX) 250 MG tablet TAKE 1 TABLET IN THE  MORNING. 07/09/15   Michele Mcalpine, MD  albuterol (PROAIR HFA) 108 (90 BASE) MCG/ACT inhaler Inhale 2 puffs into the lungs every 4 (four) hours as needed for shortness of breath. 02/28/15   Elease Etienne, MD  albuterol (PROVENTIL) (2.5 MG/3ML) 0.083% nebulizer solution Take 3 mLs (2.5 mg total) by nebulization 2 (two) times daily. DX J44.1 03/08/15   Michele Mcalpine, MD  aspirin 81 MG tablet Take 81 mg by mouth daily.      Historical Provider, MD  bimatoprost (LUMIGAN) 0.03 % ophthalmic solution Place 1 drop into both eyes at bedtime.    Historical Provider, MD  brinzolamide (AZOPT) 1 % ophthalmic suspension Place 1 drop into both eyes 3 (three) times daily.    Historical Provider, MD  budesonide-formoterol (SYMBICORT) 160-4.5 MCG/ACT inhaler Inhale 2 puffs into the lungs 2 (two) times daily. 04/10/15   Michele Mcalpine, MD  Calcium Carbonate-Vitamin D (CALCIUM 600-D) 600-400 MG-UNIT per tablet Take 1 tablet by mouth daily.     Historical Provider, MD  cholecalciferol (VITAMIN D) 1000 UNITS tablet Take 1,000 Units by mouth daily.    Historical Provider, MD  diltiazem (CARDIZEM) 120 MG tablet Take 120 mg by mouth daily.      Historical Provider, MD  levothyroxine (SYNTHROID, LEVOTHROID) 100 MCG tablet Take 100 mcg by mouth daily.  Historical Provider, MD  lisinopril (PRINIVIL,ZESTRIL) 20 MG tablet Take 40 mg by mouth daily.     Historical Provider, MD  NON FORMULARY Oxygen 3.5p-4 Liters daily.    Historical Provider, MD  predniSONE (DELTASONE) 10 MG tablet Once daily 04/10/15   Michele Mcalpine, MD  Probiotic Product (PROBIOTIC PO) Take 1 tablet by mouth daily.    Historical Provider, MD  Tiotropium Bromide Monohydrate (SPIRIVA RESPIMAT) 2.5 MCG/ACT AERS Take 1 puff by mouth daily. 04/10/15   Michele Mcalpine, MD   BP 179/146 mmHg  Pulse 122  Temp(Src) 97.9 F (36.6 C) (Axillary)  Resp 19  SpO2 100% Physical Exam  Constitutional: He is oriented to person, place, and time. He appears well-developed. No  distress.  Pale, Thin and frail  HENT:  Head: Normocephalic and atraumatic.  Mouth/Throat: Oropharynx is clear and moist.  Eyes: Conjunctivae and EOM are normal. Pupils are equal, round, and reactive to light.  Neck: Normal range of motion.  Cardiovascular: Regular rhythm and intact distal pulses.   Pulmonary/Chest: No stridor.  Very poor air movement in all fields, no rhonchi or wheezing  Positive retractions  Abdominal: Soft.  Musculoskeletal: Normal range of motion.  Neurological: He is alert and oriented to person, place, and time.  Psychiatric: He has a normal mood and affect.  Nursing note and vitals reviewed.   ED Course  .Epistaxis Management Date/Time: 07/10/2015 7:06 AM Performed by: Wynetta Emery Authorized by: Wynetta Emery Consent: Verbal consent obtained. Risks and benefits: risks, benefits and alternatives were discussed Consent given by: patient Required items: required blood products, implants, devices, and special equipment available Patient identity confirmed: verbally with patient Patient sedated: no Treatment site: right anterior and right posterior Repair method: 7.5 rapid rhino. Post-procedure assessment: bleeding decreased Treatment complexity: complex Recurrence: recurrence of recent bleed Patient tolerance: Patient tolerated the procedure well with no immediate complications   (including critical care time)  CRITICAL CARE Performed by: Wynetta Emery   Total critical care time: 50 minutes  Critical care time was exclusive of separately billable procedures and treating other patients.  Critical care was necessary to treat or prevent imminent or life-threatening deterioration.  Critical care was time spent personally by me on the following activities: development of treatment plan with patient and/or surrogate as well as nursing, discussions with consultants, evaluation of patient's response to treatment, examination of patient,  obtaining history from patient or surrogate, ordering and performing treatments and interventions, ordering and review of laboratory studies, ordering and review of radiographic studies, pulse oximetry and re-evaluation of patient's condition.  Labs Review Labs Reviewed  CBC WITH DIFFERENTIAL/PLATELET - Abnormal; Notable for the following:    WBC 12.2 (*)    RBC 3.51 (*)    Hemoglobin 11.8 (*)    HCT 37.7 (*)    MCV 107.4 (*)    Neutro Abs 8.0 (*)    Monocytes Absolute 1.7 (*)    All other components within normal limits  COMPREHENSIVE METABOLIC PANEL - Abnormal; Notable for the following:    CO2 37 (*)    Glucose, Bld 181 (*)    Total Protein 6.1 (*)    Albumin 3.1 (*)    ALT 14 (*)    All other components within normal limits  APTT - Abnormal; Notable for the following:    aPTT 39 (*)    All other components within normal limits  PROTIME-INR  CBC WITH DIFFERENTIAL/PLATELET  SAMPLE TO BLOOD BANK    Imaging Review Dg Chest  Port 1 View  07/10/2015  CLINICAL DATA:  Shortness of breath. COPD. Acute respiratory failure with hypoxemia. EXAM: PORTABLE CHEST 1 VIEW COMPARISON:  03/08/2015 FINDINGS: Heart size is normal. Severe pulmonary hyperinflation again seen, consistent with COPD. No evidence of pulmonary infiltrate or pleural effusion. Probable skin fold seen overlying peripheral right upper lung field. IMPRESSION: Severe COPD.  No acute findings. Electronically Signed   By: Myles RosenthalJohn  Stahl M.D.   On: 07/10/2015 07:54   I have personally reviewed and evaluated these images and lab results as part of my medical decision-making.   EKG Interpretation None      MDM   Final diagnoses:  Epistaxis  COPD exacerbation (HCC)  Elevated blood pressure  Tachycardia    Filed Vitals:   07/10/15 0930 07/10/15 0945 07/10/15 1000 07/10/15 1030  BP: 174/94 143/102  179/146  Pulse: 80 120  122  Temp:   97.9 F (36.6 C)   TempSrc:   Axillary   Resp: 16 14  19   SpO2: 97% 98%  100%     Medications  diltiazem (DILACOR XR) 24 hr capsule 120 mg (120 mg Oral Given 07/10/15 0848)  albuterol (PROVENTIL) (2.5 MG/3ML) 0.083% nebulizer solution 2.5 mg (not administered)  methylPREDNISolone sodium succinate (SOLU-MEDROL) 125 mg/2 mL injection 60 mg (not administered)  oxymetazoline (AFRIN) 0.05 % nasal spray 1 spray (1 spray Each Nare Given 07/10/15 0640)  methylPREDNISolone sodium succinate (SOLU-MEDROL) 125 mg/2 mL injection 125 mg (125 mg Intravenous Given 07/10/15 0748)  ipratropium-albuterol (DUONEB) 0.5-2.5 (3) MG/3ML nebulizer solution 3 mL (3 mLs Nebulization Given 07/10/15 0829)  hydrALAZINE (APRESOLINE) injection 10 mg (10 mg Intravenous Given 07/10/15 1032)  albuterol (PROVENTIL,VENTOLIN) solution continuous neb (10 mg/hr Nebulization Given 07/10/15 1052)  clindamycin (CLEOCIN) IVPB 600 mg (0 mg Intravenous Stopped 07/10/15 1204)    Brendan Herring is 74 y.o. male presenting with spontaneous nosebleed 1.5 hours ago. Patient is not anticoagulated. Attending physician suctioned in a private Afrin. Pressure was held by nurse for 15 minutes and although there was clot formation there was still active bleeding out of the room right nares. Clot is removed and rhino rocket was placed with good hemostasis.  Hemoglobin 11.8.  Very very poor air movement in all fields. Patient is given Solu-Medrol, he is on nonrebreather. He is on 5 L of home O2. I am concerned that with the right-sided nasal packing he may not be able to get enough oxygen through his nasal cannula at home.  This is a shared visit with the attending physician who personally evaluated the patient and agrees with the care plan.   Patient rechecked he saturating well on a nasal cannula is been adapted to only one prong. He continues to have slight oozing out of the left nostril but is not having blood down the posterior pharynx. All think this is a true posterior bleed just a vigorous anterior bleed  Patient remains  tachycardic, very slight improvement in air movement after Solu-Medrol and nebulizer. I think this patient warrants an observation admission. Patient was given his home dose of Cardizem with little improvement, will give hydralazine. Prudent to admit, check serial hemoglobins and monitor.   Case discussed with NP Gunnar FusiPaula from [facility], patient will be admitted to telemetry bed under attending Dr. Sunnie Nielsenegalado.  Patient is struggling significantly, will start on neb, patient will be started on BiPAP, we'll draw another hemoglobin. Likely aspiration pneumonitis from the nosebleed. Patient will be started on clindamycin. Patient was evaluated with attending Dr. Fayrene FearingJames. He  confirms the patient is DO NOT RESUSCITATE and does not want intubation. Updated Dr. Sunnie Nielsen, who will have intensivist consult on the stepdown floor.   Intensivist Dr. Kendrick Fries is in the room and states that he will go to ICU, he requests ENT consult.  ENT consult from Dr. Suszanne Conners appreciated: He will evaluate this patient when he is admitted to the ICU.   Wynetta Emery, PA-C 07/10/15 [address] PA-C 07/10/15 1235  Rolland Porter, MD 07/11/15 1106

## 2015-07-10 NOTE — ED Notes (Addendum)
Per EMS, pt woke up at 0530 with a nosebleed; pt denies fall or trauma. Pt not on blood thinners. No other complaints. BP-186/96, P- 110

## 2015-07-10 NOTE — Consult Note (Signed)
Reason for Consult: Severe right epistaxis  HPI:  Brendan Herring is an 74 y.o. male who was brought to the ER this morning for treatment of his severe right epistaxis. The bleeding is both anterior and posterior. The patient has a history of end stage COPD (patient is on 5 L nasal cannula at all times). Patient denies anticoagulation, states he's had a nasal infection recently. Denies chest pain or shortness of breath above his baseline, has history of nosebleeds which has never required ENT intervention. Patient has chronic hypertension, he hasn't taken his medications this a.m. ER MD placed a rhinorocket packing in the right nasal cavity with good control of the bleeding.   Past Medical History  Diagnosis Date  . Emphysema   . Hypertension   . Coronary artery disease   . Thyroid disease   . Glaucoma   . Hypercholesterolemia   . COPD (chronic obstructive pulmonary disease) [facility])     Past Surgical History  Procedure Laterality Date  . Back surgery  1982  . Cataract extraction  1990    bilateral  . Tonsillectomy  1994  . Wisdom tooth extraction  1960  . Vasectomy  1970  . Coronary angioplasty with stent placement      Family History  Problem Relation Age of Onset  . Emphysema Father   . Heart failure Father   . Stroke Father   . Heart disease Father   . Heart failure Brother   . Heart disease Mother     Social History:  reports that he quit smoking about 20 years ago. His smoking use included Cigarettes. He has a 35 pack-year smoking history. He does not have any smokeless tobacco history on file. He reports that he does not drink alcohol or use illicit drugs.  Allergies:  Allergies  Allergen Reactions  . Benazepril Hcl     REACTION: sensitive to sunlight  . Levofloxacin     REACTION: tendonitis    Prior to Admission medications   Medication Sig Start Date End Date Taking? Authorizing Provider  acetaZOLAMIDE (DIAMOX) 250 MG tablet TAKE 1 TABLET IN THE MORNING.  07/09/15   Noralee Space, MD  albuterol (PROAIR HFA) 108 (90 BASE) MCG/ACT inhaler Inhale 2 puffs into the lungs every 4 (four) hours as needed for shortness of breath. 02/28/15   Modena Jansky, MD  albuterol (PROVENTIL) (2.5 MG/3ML) 0.083% nebulizer solution Take 3 mLs (2.5 mg total) by nebulization 2 (two) times daily. DX J44.1 03/08/15   Noralee Space, MD  aspirin 81 MG tablet Take 81 mg by mouth daily.      Historical Provider, MD  bimatoprost (LUMIGAN) 0.03 % ophthalmic solution Place 1 drop into both eyes at bedtime.    Historical Provider, MD  brinzolamide (AZOPT) 1 % ophthalmic suspension Place 1 drop into both eyes 3 (three) times daily.    Historical Provider, MD  budesonide-formoterol (SYMBICORT) 160-4.5 MCG/ACT inhaler Inhale 2 puffs into the lungs 2 (two) times daily. 04/10/15   Noralee Space, MD  Calcium Carbonate-Vitamin D (CALCIUM 600-D) 600-400 MG-UNIT per tablet Take 1 tablet by mouth daily.     Historical Provider, MD  cholecalciferol (VITAMIN D) 1000 UNITS tablet Take 1,000 Units by mouth daily.    Historical Provider, MD  diltiazem (CARDIZEM) 120 MG tablet Take 120 mg by mouth daily.      Historical Provider, MD  levothyroxine (SYNTHROID, LEVOTHROID) 100 MCG tablet Take 100 mcg by mouth daily.      Historical  Provider, MD  lisinopril (PRINIVIL,ZESTRIL) 20 MG tablet Take 40 mg by mouth daily.     Historical Provider, MD  NON FORMULARY Oxygen 3.5p-4 Liters daily.    Historical Provider, MD  predniSONE (DELTASONE) 10 MG tablet Once daily 04/10/15   Noralee Space, MD  Probiotic Product (PROBIOTIC PO) Take 1 tablet by mouth daily.    Historical Provider, MD  Tiotropium Bromide Monohydrate (SPIRIVA RESPIMAT) 2.5 MCG/ACT AERS Take 1 puff by mouth daily. 04/10/15   Noralee Space, MD    Medications:  I have reviewed the patient's current medications. Scheduled: . brimonidine  1 drop Left Eye BID  . [START ON 07/11/2015] calcium-vitamin D  1 tablet Oral Daily  . clindamycin (CLEOCIN)  IV  600 mg Intravenous Q8H  . [START ON 07/11/2015] diltiazem  120 mg Oral Daily  . insulin aspart  0-9 Units Subcutaneous TID WC  . ipratropium-albuterol  3 mL Nebulization Q4H  . latanoprost  1 drop Both Eyes QHS  . [START ON 07/11/2015] levothyroxine  112 mcg Oral QAC breakfast  . methylPREDNISolone (SOLU-MEDROL) injection  60 mg Intravenous 3 times per day  . sodium chloride  3 mL Intravenous Q12H   ONG:[id number] **OR** acetaminophen, albuterol, HYDROcodone-acetaminophen, ondansetron **OR** ondansetron (ZOFRAN) IV  Results for orders placed or performed during the hospital encounter of 07/10/15 (from the past 48 hour(s))  CBC with Differential     Status: Abnormal   Collection Time: 07/10/15  6:47 AM  Result Value Ref Range   WBC 12.2 (H) 4.0 - 10.5 K/uL   RBC 3.51 (L) 4.22 - 5.81 MIL/uL   Hemoglobin 11.8 (L) 13.0 - 17.0 g/dL   HCT 37.7 (L) 39.0 - 52.0 %   MCV 107.4 (H) 78.0 - 100.0 fL   MCH 33.6 26.0 - 34.0 pg   MCHC 31.3 30.0 - 36.0 g/dL   RDW 12.4 11.5 - 15.5 %   Platelets 313 150 - 400 K/uL   Neutrophils Relative % 66 %   Neutro Abs 8.0 (H) 1.7 - 7.7 K/uL   Lymphocytes Relative 17 %   Lymphs Abs 2.0 0.7 - 4.0 K/uL   Monocytes Relative 14 %   Monocytes Absolute 1.7 (H) 0.1 - 1.0 K/uL   Eosinophils Relative 4 %   Eosinophils Absolute 0.4 0.0 - 0.7 K/uL   Basophils Relative 1 %   Basophils Absolute 0.1 0.0 - 0.1 K/uL  Comprehensive metabolic panel     Status: Abnormal   Collection Time: 07/10/15  6:47 AM  Result Value Ref Range   Sodium 144 135 - 145 mmol/L   Potassium 3.5 3.5 - 5.1 mmol/L   Chloride 102 101 - 111 mmol/L   CO2 37 (H) 22 - 32 mmol/L   Glucose, Bld 181 (H) 65 - 99 mg/dL   BUN 16 6 - 20 mg/dL   Creatinine, Ser 0.80 0.61 - 1.24 mg/dL   Calcium 9.3 8.9 - 10.3 mg/dL   Total Protein 6.1 (L) 6.5 - 8.1 g/dL   Albumin 3.1 (L) 3.5 - 5.0 g/dL   AST 15 15 - 41 U/L   ALT 14 (L) 17 - 63 U/L   Alkaline Phosphatase 62 38 - 126 U/L   Total Bilirubin 0.5 0.3 -  1.2 mg/dL   GFR calc non Af Amer >60 >60 mL/min   GFR calc Af Amer >60 >60 mL/min    Comment: (NOTE) The eGFR has been calculated using the CKD EPI equation. This calculation has not been validated in all  clinical situations. eGFR's persistently <60 mL/min signify possible Chronic Kidney Disease.    Anion gap 5 5 - 15  APTT     Status: Abnormal   Collection Time: 07/10/15  6:47 AM  Result Value Ref Range   aPTT 39 (H) 24 - 37 seconds    Comment:        IF BASELINE aPTT IS ELEVATED, SUGGEST PATIENT RISK ASSESSMENT BE USED TO DETERMINE APPROPRIATE ANTICOAGULANT THERAPY.   Protime-INR     Status: None   Collection Time: 07/10/15  6:47 AM  Result Value Ref Range   Prothrombin Time 13.7 11.6 - 15.2 seconds   INR 1.03 0.00 - 1.49  Sample to Blood Bank     Status: None   Collection Time: 07/10/15  6:47 AM  Result Value Ref Range   Blood Bank Specimen SAMPLE AVAILABLE FOR TESTING    Sample Expiration 07/11/2015   CBC with Differential     Status: Abnormal   Collection Time: 07/10/15 11:41 AM  Result Value Ref Range   WBC 15.3 (H) 4.0 - 10.5 K/uL   RBC 3.69 (L) 4.22 - 5.81 MIL/uL   Hemoglobin 12.4 (L) 13.0 - 17.0 g/dL   HCT 39.6 39.0 - 52.0 %   MCV 107.3 (H) 78.0 - 100.0 fL   MCH 33.6 26.0 - 34.0 pg   MCHC 31.3 30.0 - 36.0 g/dL   RDW 12.4 11.5 - 15.5 %   Platelets 356 150 - 400 K/uL   Neutrophils Relative % 94 %   Neutro Abs 14.4 (H) 1.7 - 7.7 K/uL   Lymphocytes Relative 4 %   Lymphs Abs 0.6 (L) 0.7 - 4.0 K/uL   Monocytes Relative 2 %   Monocytes Absolute 0.3 0.1 - 1.0 K/uL   Eosinophils Relative 0 %   Eosinophils Absolute 0.0 0.0 - 0.7 K/uL   Basophils Relative 0 %   Basophils Absolute 0.0 0.0 - 0.1 K/uL    Dg Chest Port 1 View  07/10/2015  CLINICAL DATA:  Shortness of breath. COPD. Acute respiratory failure with hypoxemia. EXAM: PORTABLE CHEST 1 VIEW COMPARISON:  03/08/2015 FINDINGS: Heart size is normal. Severe pulmonary hyperinflation again seen, consistent with  COPD. No evidence of pulmonary infiltrate or pleural effusion. Probable skin fold seen overlying peripheral right upper lung field. IMPRESSION: Severe COPD.  No acute findings. Electronically Signed   By: Earle Gell M.D.   On: 07/10/2015 07:54   Review of Systems  Constitutional: Negative.  HENT: Positive for nosebleeds.  Eyes: Negative.  Respiratory: Positive for shortness of breath.  Cardiovascular: Negative.  Gastrointestinal: Negative.  Genitourinary: Negative.  Musculoskeletal: Negative.  Skin: Negative.  Neurological: Negative.  Endo/Heme/Allergies: Negative.  Psychiatric/Behavioral: Negative.   Blood pressure 138/83, pulse 126, temperature 97.9 F (36.6 C), temperature source Axillary, resp. rate 20, SpO2 100 %.  PHYSICAL EXAMINATION: General: Chronically ill appearing male in respiratory distress, on BiPap  Neuro: Alert, oriented, no focal deficits  Eyes: PERRL, EOMI. Ears: Normal auricles and EACs. Nose: Packing in place in Rt nare with dried blood around Rt nostril; no active bleeding noted from the left nostril Mouth: No lesion. Normal mucosa. Neck: Trachea midline. No LAD or mass. Cardiovascular: Regular, tachycardic  Lungs: Diminished in all lung fields bilaterally, no stridor. Abdomen: Soft, non-distended, non-tender   Assessment/Plan: Severe right epistaxis. Now under control with right nasal packing. Will leave packing in place for at least 2 days. On clindamycin. Will follow.  Boyd Buffalo,SUI W 07/10/2015, 12:51 PM

## 2015-07-10 NOTE — Consult Note (Signed)
PULMONARY / CRITICAL CARE MEDICINE   Name: Brendan Herring MRN: [id number] DOB: 01/02/1941    ADMISSION DATE:  07/10/2015 CONSULTATION DATE:  11/23  REFERRING MD :  Jon BillingsBelkys   CHIEF COMPLAINT:  Epistaxis/COPD Exacerbation   HISTORY OF PRESENT ILLNESS:   89 yom with PMH of COPD (GOLD Stage IV) on 5 L of NCO2 at home, HTN, CAD, and thyroid disease [facility] c/o epistaxis. Pt awoke at 0530 11/23 with massive Rt sided nosebleed (hx of same; second major nosebleed in 3 months). He is not on anticoagulation. In ED, clot was removed from right nare and rhino packing was placed by EDP. He was also noted to be tachycardic, hypertensive, and SOB. WOB increased during ED course eventually requiring BiPap. PCCM was consulted.   PAST MEDICAL HISTORY :  He  has a past medical history of Emphysema; Hypertension; Coronary artery disease; Thyroid disease; Glaucoma; Hypercholesterolemia; and COPD (chronic obstructive pulmonary disease) (HCC).  PAST SURGICAL HISTORY: He  has past surgical history that includes Back surgery (1982); Cataract extraction (1990); Tonsillectomy (1994); Wisdom tooth extraction (1960); Vasectomy (1970); and Coronary angioplasty with stent.  Allergies  Allergen Reactions  . Benazepril Hcl     REACTION: sensitive to sunlight  . Levofloxacin     REACTION: tendonitis    No current facility-administered medications on file prior to encounter.   Current Outpatient Prescriptions on File Prior to Encounter  Medication Sig  . acetaZOLAMIDE (DIAMOX) 250 MG tablet TAKE 1 TABLET IN THE MORNING.  [name]. albuterol (PROAIR HFA) 108 (90 BASE) MCG/ACT inhaler Inhale 2 puffs into the lungs every 4 (four) hours as needed for shortness of breath.  [name]. albuterol (PROVENTIL) (2.5 MG/3ML) 0.083% nebulizer solution Take 3 mLs (2.5 mg total) by nebulization 2 (two) times daily. DX J44.1  . aspirin 81 MG tablet Take 81 mg by mouth daily.    . bimatoprost (LUMIGAN) 0.03 % ophthalmic solution  Place 1 drop into both eyes at bedtime.  . brinzolamide (AZOPT) 1 % ophthalmic suspension Place 1 drop into both eyes 3 (three) times daily.  . budesonide-formoterol (SYMBICORT) 160-4.5 MCG/ACT inhaler Inhale 2 puffs into the lungs 2 (two) times daily.  . Calcium Carbonate-Vitamin D (CALCIUM 600-D) 600-400 MG-UNIT per tablet Take 1 tablet by mouth daily.   . cholecalciferol (VITAMIN D) 1000 UNITS tablet Take 1,000 Units by mouth daily.  [name]. diltiazem (CARDIZEM) 120 MG tablet Take 120 mg by mouth daily.    [name]. levothyroxine (SYNTHROID, LEVOTHROID) 100 MCG tablet Take 100 mcg by mouth daily.    [name]. lisinopril (PRINIVIL,ZESTRIL) 20 MG tablet Take 40 mg by mouth daily.   . NON FORMULARY Oxygen 3.5p-4 Liters daily.  . predniSONE (DELTASONE) 10 MG tablet Once daily  . Probiotic Product (PROBIOTIC PO) Take 1 tablet by mouth daily.  . Tiotropium Bromide Monohydrate (SPIRIVA RESPIMAT) 2.5 MCG/ACT AERS Take 1 puff by mouth daily.    FAMILY HISTORY:  His indicated that his mother is deceased. He indicated that his father is deceased. He indicated that his brother is alive.   SOCIAL HISTORY: He  reports that he quit smoking about 20 years ago. His smoking use included Cigarettes. He has a 35 pack-year smoking history. He does not have any smokeless tobacco history on file. He reports that he does not drink alcohol or use illicit drugs.  REVIEW OF SYSTEMS:   Unable  SUBJECTIVE:  Pt states "I feel better on this mask"   VITAL SIGNS: BP 179/146 mmHg  Pulse 130  Temp(Src) 97.9 F (36.6 C) (Axillary)  Resp 26  SpO2 100%  HEMODYNAMICS:    VENTILATOR SETTINGS: Vent Mode:  [-]  FiO2 (%):  [60 %-100 %] 60 %  INTAKE / OUTPUT:    PHYSICAL EXAMINATION: General:  Chronically ill appearing male in respiratory distress, on BiPap  Neuro:  Alert, oriented, no focal deficits  HEENT:  NCAT, MMM, packing in place in Rt nare with dried blood around Rt nostril; no active bleeding noted  Cardiovascular:   Regular, tachycardic  Lungs:  Diminished in all lung fields bilaterally, use of accessory muscles  Abdomen:  Soft, non-distended, non-tender  Musculoskeletal:  Intact  Skin:  Intact   LABS:  CBC  Recent Labs Lab 07/10/15 0647  WBC 12.2*  HGB 11.8*  HCT 37.7*  PLT 313   Coag's  Recent Labs Lab 07/10/15 0647  APTT 39*  INR 1.03   BMET  Recent Labs Lab 07/10/15 0647  NA 144  K 3.5  CL 102  CO2 37*  BUN 16  CREATININE 0.80  GLUCOSE 181*   Electrolytes  Recent Labs Lab 07/10/15 0647  CALCIUM 9.3   Sepsis Markers No results for input(s): LATICACIDVEN, PROCALCITON, O2SATVEN in the last 168 hours. ABG No results for input(s): PHART, PCO2ART, PO2ART in the last 168 hours. Liver Enzymes  Recent Labs Lab 07/10/15 0647  AST 15  ALT 14*  ALKPHOS 62  BILITOT 0.5  ALBUMIN 3.1*   Cardiac Enzymes No results for input(s): TROPONINI, PROBNP in the last 168 hours. Glucose No results for input(s): GLUCAP in the last 168 hours.  Imaging Dg Chest Port 1 View  07/10/2015  CLINICAL DATA:  Shortness of breath. COPD. Acute respiratory failure with hypoxemia. EXAM: PORTABLE CHEST 1 VIEW COMPARISON:  03/08/2015 FINDINGS: Heart size is normal. Severe pulmonary hyperinflation again seen, consistent with COPD. No evidence of pulmonary infiltrate or pleural effusion. Probable skin fold seen overlying peripheral right upper lung field. IMPRESSION: Severe COPD.  No acute findings. Electronically Signed   By: Myles Rosenthal M.D.   On: 07/10/2015 07:54     STUDIES:  CXR 11/23 > Severe COPD, no evidence of pneumonia   CULTURES: Sputum Cx 11/23 >>   ANTIBIOTICS: Clindamycin 11/23 >>   SIGNIFICANT EVENTS: 11/23> Pt placed on Bipap, admitted to ICU   LINES/TUBES:   DISCUSSION: Pt is a 41 yom with COPD on 5L of [place] O2 at home who presented to ED on 11/23 with epistaxis and resultant AECOPD. Admit to ICU. Continue BiPap, solumedrol, scheduled BDs. Goals of care discussed at  bedside 11/23, Pt DNR and does NOT wish to be intubated. If pt declines, he would like comfort care.   ASSESSMENT / PLAN:  PULMONARY A: Acute respiratory failure in the setting of AECOPD -Z>prob exacerbated by epistaxis P:   BiPap  Wean to [place] as tolerated  Scheduled BDs Solumedrol 60 mg Q6   CARDIOVASCULAR A:  Sinus Tachycardia  Transient Atrial Fibrillation with RVR --> New onset; likely d/t work of breathing  Hypertension P:  Tele  Continue home Cardizem Hold home ASA d/t bleeding   RENAL A:   Chronic Respiratory Acidosis  P:   NS @ 75/hr  F/u Bmet  GASTROINTESTINAL A:   No Acute P:   NPO GI Prophylaxis with PPI  HEMATOLOGIC A:   Epistaxis--> improved after clot evacuation and rhino placed; no active bleeding P:  Consult ENT for rhino  F/u CBC   INFECTIOUS A:   Leukocytosis--> no  clear evidence of infection AECOPD P:   Sputum Cx 11/23 >>  Clindamycin (for nasal packing, possible aspiration)11/23 >>    ENDOCRINE A:   Hyperglycemia --> At risk for worsening hyperglycemia with steroid use  H/o Hypothyroidism P:   CBG and SSI  Continue home Synthroid  NEUROLOGIC A:   No Acute P:   RASS goal: 0 Avoid sedating meds    FAMILY  - Updates: Pt's son and daughter updated at length at bedside 11/23. Pt is full DNR and DNI.   - Inter-disciplinary family meet or Palliative Care meeting due by:  11/30   Simonne Martinet ACNP-BC [facility] Pulmonary/Critical Care Pager # [phone] OR # [phone] if no answer  Pulmonary and Critical Care Medicine [facility] Pager: [phone]  07/10/2015, 11:26 AM

## 2015-07-10 NOTE — ED Notes (Signed)
Pt st " i dont want to walk, i dont feel good" Dr. Fayrene FearingJames made aware. Pt ambulates with walker at home.

## 2015-07-10 NOTE — Progress Notes (Signed)
PT Cancellation Note  Patient Details Name: Brendan Herring MRN: [id number] DOB: 06/12/1941   Cancelled Treatment:    Reason Eval/Treat Not Completed:  (Pt declined.) Pt request PT to come back as he is fatigued and didn't get much sleep. Pt and RN notified pt will NOT be seen on 11/24 and will be evaluated on 11/25.    Marcene BrawnChadwell, Ryu Cerreta Marie 07/10/2015, 4:25 PM  Lewis ShockAshly Lasheika Ortloff, PT, DPT Pager #: [phone] Office #: [phone]

## 2015-07-10 NOTE — ED Notes (Signed)
X-ray at bedside

## 2015-07-11 DIAGNOSIS — Z681 Body mass index (BMI) 19 or less, adult: Secondary | ICD-10-CM | POA: Diagnosis not present

## 2015-07-11 DIAGNOSIS — R040 Epistaxis: Secondary | ICD-10-CM | POA: Diagnosis present

## 2015-07-11 DIAGNOSIS — E039 Hypothyroidism, unspecified: Secondary | ICD-10-CM | POA: Diagnosis present

## 2015-07-11 DIAGNOSIS — I10 Essential (primary) hypertension: Secondary | ICD-10-CM | POA: Diagnosis present

## 2015-07-11 DIAGNOSIS — E43 Unspecified severe protein-calorie malnutrition: Secondary | ICD-10-CM | POA: Diagnosis present

## 2015-07-11 DIAGNOSIS — Z955 Presence of coronary angioplasty implant and graft: Secondary | ICD-10-CM | POA: Diagnosis not present

## 2015-07-11 DIAGNOSIS — I2510 Atherosclerotic heart disease of native coronary artery without angina pectoris: Secondary | ICD-10-CM | POA: Diagnosis present

## 2015-07-11 DIAGNOSIS — R030 Elevated blood-pressure reading, without diagnosis of hypertension: Secondary | ICD-10-CM

## 2015-07-11 DIAGNOSIS — Z66 Do not resuscitate: Secondary | ICD-10-CM | POA: Diagnosis present

## 2015-07-11 DIAGNOSIS — J430 Unilateral pulmonary emphysema [MacLeod's syndrome]: Secondary | ICD-10-CM | POA: Diagnosis not present

## 2015-07-11 DIAGNOSIS — J9621 Acute and chronic respiratory failure with hypoxia: Secondary | ICD-10-CM | POA: Diagnosis present

## 2015-07-11 DIAGNOSIS — J9601 Acute respiratory failure with hypoxia: Secondary | ICD-10-CM | POA: Diagnosis not present

## 2015-07-11 DIAGNOSIS — R0600 Dyspnea, unspecified: Secondary | ICD-10-CM | POA: Diagnosis present

## 2015-07-11 DIAGNOSIS — J439 Emphysema, unspecified: Secondary | ICD-10-CM | POA: Diagnosis present

## 2015-07-11 DIAGNOSIS — H409 Unspecified glaucoma: Secondary | ICD-10-CM | POA: Diagnosis present

## 2015-07-11 DIAGNOSIS — Z7952 Long term (current) use of systemic steroids: Secondary | ICD-10-CM | POA: Diagnosis not present

## 2015-07-11 DIAGNOSIS — I4891 Unspecified atrial fibrillation: Secondary | ICD-10-CM | POA: Diagnosis present

## 2015-07-11 DIAGNOSIS — J441 Chronic obstructive pulmonary disease with (acute) exacerbation: Secondary | ICD-10-CM | POA: Diagnosis not present

## 2015-07-11 DIAGNOSIS — R739 Hyperglycemia, unspecified: Secondary | ICD-10-CM | POA: Diagnosis present

## 2015-07-11 DIAGNOSIS — Z87891 Personal history of nicotine dependence: Secondary | ICD-10-CM | POA: Diagnosis not present

## 2015-07-11 LAB — BASIC METABOLIC PANEL
ANION GAP: 8 (ref 5–15)
BUN: 24 mg/dL — ABNORMAL HIGH (ref 6–20)
CALCIUM: 9.4 mg/dL (ref 8.9–10.3)
CHLORIDE: 102 mmol/L (ref 101–111)
CO2: 35 mmol/L — AB (ref 22–32)
Creatinine, Ser: 0.88 mg/dL (ref 0.61–1.24)
GFR calc non Af Amer: >60 mL/min (ref >60)
Glucose, Bld: 226 mg/dL — ABNORMAL HIGH (ref 65–99)
Potassium: 3.8 mmol/L (ref 3.5–5.1)
Sodium: 145 mmol/L (ref 135–145)

## 2015-07-11 LAB — CBC
HEMATOCRIT: 37.6 % — AB (ref 39.0–52.0)
HEMOGLOBIN: 11.8 g/dL — AB (ref 13.0–17.0)
MCH: 33.2 pg (ref 26.0–34.0)
MCHC: 31.4 g/dL (ref 30.0–36.0)
MCV: 105.9 fL — ABNORMAL HIGH (ref 78.0–100.0)
Platelets: 291 10*3/uL (ref 150–400)
RBC: 3.55 MIL/uL — AB (ref 4.22–5.81)
RDW: 12.3 % (ref 11.5–15.5)
WBC: 9.7 10*3/uL (ref 4.0–10.5)

## 2015-07-11 LAB — GLUCOSE, CAPILLARY
GLUCOSE-CAPILLARY: 226 mg/dL — AB (ref 65–99)
GLUCOSE-CAPILLARY: 155 mg/dL — AB (ref 65–99)
Glucose-Capillary: 157 mg/dL — ABNORMAL HIGH (ref 65–99)
Glucose-Capillary: 190 mg/dL — ABNORMAL HIGH (ref 65–99)

## 2015-07-11 NOTE — Progress Notes (Signed)
Took patient off of Bipap and gave patient scheduled nebulizer treatment.  After treatment was finished, placed patient on 24% venturi mask.  Patient is currently tolerating well.  Will continue to monitor.

## 2015-07-11 NOTE — Progress Notes (Signed)
Subjective: No more epistaxis. Resting comfortably in bed.  Objective: Vital signs in last 24 hours: Temp:  [97.7 F (36.5 C)-99.3 F (37.4 C)] 98.3 F (36.8 C) (11/24 0836) Pulse Rate:  [80-143] 119 (11/24 0730) Resp:  [14-37] 18 (11/24 0730) BP: (125-199)/(68-146) 155/78 mmHg (11/24 0730) SpO2:  [91 %-100 %] 100 % (11/24 0725) FiO2 (%):  [24 %-50 %] 31 % (11/24 0813) Weight:  [58.1 kg (128 lb 1.4 oz)] 58.1 kg (128 lb 1.4 oz) (11/24 0445)  PHYSICAL EXAMINATION: General: Chronically ill appearing male in no acute distress. On O2 facemask. Eyes: PERRL, EOMI. Ears: Normal auricles and EACs. Nose: Packing in place in Rt nare with dried blood around Rt nostril; no active bleeding noted from the left nostril Mouth: No lesion. Normal mucosa. Neck: Trachea midline. No LAD or mass.   Recent Labs  07/10/15 1355 07/11/15 0207  WBC 16.1* 9.7  HGB 12.6* 11.8*  HCT 40.0 37.6*  PLT 366 291    Recent Labs  07/10/15 0647 07/11/15 0207  NA 144 145  K 3.5 3.8  CL 102 102  CO2 37* 35*  GLUCOSE 181* 226*  BUN 16 24*  CREATININE 0.80 0.88  CALCIUM 9.3 9.4    Medications:  I have reviewed the patient's current medications. Scheduled: . brimonidine  1 drop Left Eye BID  . calcium-vitamin D  1 tablet Oral Daily  . clindamycin (CLEOCIN) IV  600 mg Intravenous Q8H  . diltiazem  120 mg Oral Daily  . insulin aspart  0-9 Units Subcutaneous TID WC  . ipratropium-albuterol  3 mL Nebulization Q4H  . latanoprost  1 drop Both Eyes QHS  . levothyroxine  112 mcg Oral QAC breakfast  . methylPREDNISolone (SOLU-MEDROL) injection  60 mg Intravenous 3 times per day  . sodium chloride  3 mL Intravenous Q12H   ZOX:[id number]:acetaminophen **OR** acetaminophen, albuterol, hydrALAZINE, HYDROcodone-acetaminophen, ondansetron **OR** ondansetron (ZOFRAN) IV  Assessment/Plan: Severe right epistaxis. Now under control with right nasal packing. Will leave packing in place until tomorrow. On clindamycin. Will  follow.     Sashay Felling,SUI W 07/11/2015, 9:24 AM

## 2015-07-11 NOTE — Progress Notes (Signed)
Pt report called and received from RN from transferring unit.

## 2015-07-11 NOTE — Progress Notes (Signed)
NURSING PROGRESS NOTE  Brendan Herring [id number] Transfer Data: 07/11/2015 5:10 PM Attending Provider: Alba CoryBelkys A Regalado, MD PCP: Duane Lopeoss, Alan, MD Code Status: DNR   Brendan Herring is a 74 y.o. male patient transferred from 44M  -No acute distress noted.  -No complaints of shortness of breath.  -No complaints of chest pain.   Cardiac Monitoring: Box # 10 in place. Cardiac monitor yields:sinus tachycardia.  Last Documented Vital Signs: Blood pressure 160/86, pulse 113, temperature 98.1 F (36.7 C), temperature source Oral, resp. rate 16, height 6\' 1"  (1.854 m), weight 58.1 kg (128 lb 1.4 oz), SpO2 99 %.  IV Fluids:  IV in place, occlusive dsg intact without redness, IV cath wrist right, condition patent and no redness    Allergies:  Benazepril hcl and Levofloxacin  Past Medical History:   has a past medical history of Emphysema; Hypertension; Coronary artery disease; Thyroid disease; Glaucoma; Hypercholesterolemia; and COPD (chronic obstructive pulmonary disease) (HCC).  Past Surgical History:   has past surgical history that includes Back surgery (1982); Cataract extraction (1990); Tonsillectomy (1994); Wisdom tooth extraction (1960); Vasectomy (1970); and Coronary angioplasty with stent.  Social History:   reports that he quit smoking about 20 years ago. His smoking use included Cigarettes. He has a 35 pack-year smoking history. He does not have any smokeless tobacco history on file. He reports that he does not drink alcohol or use illicit drugs.  Skin: Intact except as otherwise charted  Patient/Family orientated to room. Information packet given to patient/family. Admission inpatient armband information verified with patient/family to include name and date of birth and placed on patient arm. Side rails up x 2, fall assessment and education completed with patient/family. Patient/family able to verbalize understanding of risk associated with falls and verbalized understanding to call  for assistance before getting out of bed. Call light within reach. Patient/family able to voice and demonstrate understanding of unit orientation instructions.

## 2015-07-11 NOTE — Progress Notes (Signed)
PULMONARY / CRITICAL CARE MEDICINE   Name: Brendan Herring MRN: [id number] DOB: 06/30/1941    ADMISSION DATE:  07/10/2015 CONSULTATION DATE:  11/23  REFERRING MD :  Jon BillingsBelkys   CHIEF COMPLAINT:  Epistaxis/COPD Exacerbation   SUBJECTIVE:  Pt reports he is feeling much better than yesterday.  Anxious to drink water.  RN reports pt has been off bipap all day. Remains on 31% VM without distress  VITAL SIGNS: BP 141/82 mmHg  Pulse 122  Temp(Src) 98.3 F (36.8 C) (Oral)  Resp 18  Ht 6\' 1"  (1.854 m)  Wt 128 lb 1.4 oz (58.1 kg)  BMI 16.90 kg/m2  SpO2 98%  HEMODYNAMICS:    VENTILATOR SETTINGS: Vent Mode:  [-]  FiO2 (%):  [24 %-50 %] 31 %  INTAKE / OUTPUT: I/O last 3 completed shifts: In: 200 [Other:100; IV Piggyback:100] Out: 680 [Urine:680]  PHYSICAL EXAMINATION: General:  Chronically ill appearing male in NAD Neuro:  Alert, oriented, no focal deficits, MAE HEENT:  NCAT, MMM, packing in place in Rt nare with dried blood around Rt nostril; no active bleeding noted  Cardiovascular:  Regular, tachycardic  Lungs:  Diminished in all lung fields bilaterally, no accessory muscle use, prolonged exp phase  Abdomen:  Soft, non-distended, non-tender  Musculoskeletal:  Intact  Skin:  Intact   LABS:  CBC  Recent Labs Lab 07/10/15 1141 07/10/15 1355 07/11/15 0207  WBC 15.3* 16.1* 9.7  HGB 12.4* 12.6* 11.8*  HCT 39.6 40.0 37.6*  PLT 356 366 291   Coag's  Recent Labs Lab 07/10/15 0647  APTT 39*  INR 1.03   BMET  Recent Labs Lab 07/10/15 0647 07/11/15 0207  NA 144 145  K 3.5 3.8  CL 102 102  CO2 37* 35*  BUN 16 24*  CREATININE 0.80 0.88  GLUCOSE 181* 226*   Electrolytes  Recent Labs Lab 07/10/15 0647 07/11/15 0207  CALCIUM 9.3 9.4   Sepsis Markers No results for input(s): LATICACIDVEN, PROCALCITON, O2SATVEN in the last 168 hours.   ABG No results for input(s): PHART, PCO2ART, PO2ART in the last 168 hours.   Liver Enzymes  Recent Labs Lab  07/10/15 0647  AST 15  ALT 14*  ALKPHOS 62  BILITOT 0.5  ALBUMIN 3.1*   Cardiac Enzymes No results for input(s): TROPONINI, PROBNP in the last 168 hours. Glucose  Recent Labs Lab 07/10/15 1343 07/10/15 1527 07/10/15 1910 07/11/15 0752 07/11/15 1119  GLUCAP 216* 227* 211* 226* 155*    Imaging No results found.   STUDIES:  CXR 11/23 > Severe COPD, no evidence of pneumonia   CULTURES: Sputum Cx 11/23 >>   ANTIBIOTICS: Clindamycin 11/23 >>   SIGNIFICANT EVENTS: 11/23 > Pt placed on Bipap, admitted to ICU   LINES/TUBES:   DISCUSSION: [age] yom with COPD on 5L of [place] O2 at home who presented to ED on 11/23 with epistaxis and resultant AECOPD. Admit to ICU. Continue BiPAP, solumedrol, scheduled BDs. Goals of care discussed at bedside 11/23, Pt DNR and does NOT wish to be intubated. If pt declines, he would like comfort care.   ASSESSMENT / PLAN:  PULMONARY A: Acute respiratory failure in the setting of AECOPD - prob exacerbated by epistaxis P:   BiPAP PRN for increased WOB, may be able to dc Wean to [place] as tolerated, baseline 5 L Scheduled BDs Solumedrol 60 mg Q8, consider reduction in am 11/25  CARDIOVASCULAR A:  Sinus Tachycardia  Transient Atrial Fibrillation with RVR - new onset; likely d/t work of  breathing.  Resolved.  Hypertension P:  Tele monitoring  Continue home Cardizem Hold home ASA d/t bleeding  DNR  RENAL A:   Chronic Respiratory Acidosis  P:   NS to 74ml/hr  F/u Bmet  GASTROINTESTINAL A:   No Acute Issues P:   Advance diet as tolerated  GI Prophylaxis with PPI  HEMATOLOGIC A:   Epistaxis - improved after clot evacuation and rhino placed; no active bleeding P:  ENT following, packing in place  F/u CBC   INFECTIOUS A:   Leukocytosis - no clear evidence of infection AECOPD P:   Sputum Cx 11/23 >>  Clindamycin (for nasal packing, possible aspiration) 11/23 >>  Monitor fever curve / WBC  ENDOCRINE A:   Hyperglycemia - at  risk for worsening hyperglycemia with steroid use  H/o Hypothyroidism P:   CBG and SSI  Continue home Synthroid  NEUROLOGIC A:   No Acute Issues P:   RASS goal: 0 Avoid sedating meds    FAMILY  - Updates: Pt's son and daughter updated at length at bedside 11/23. Pt is full DNR and DNI.   - Inter-disciplinary family meet or Palliative Care meeting due by:  11/30   GLOBAL:  Tx out of ICU to medical floor and back to [facility].    Canary Brim, NP-C [place] [facility] Pgr: [phone] or if no answer [phone] 07/11/2015, 1:38 PM  STAFF NOTE: I, Rory Percy, MD FACP have personally reviewed patient's available data, including medical history, events of note, physical examination and test results as part of my evaluation. I have discussed with resident/NP and other care providers such as pharmacist, RN and RRT. In addition, I personally evaluated patient and elicited key findings of: IMproved resp status, wob wnl now, pcxr without infiltrates, clinda for packing nose not for PNA, dc bipap only adds risk aspiration a this stage, cbc in am , reduce steroids in am, continued BDers, can move out of icu to [facility], O2 to goal baseline if able   Mcarthur Rossetti. Tyson Alias, MD, FACP Pgr: [phone] [place] [facility] 07/11/2015 2:07 PM

## 2015-07-12 ENCOUNTER — Ambulatory Visit: Payer: Medicare Other | Admitting: Pulmonary Disease

## 2015-07-12 DIAGNOSIS — I10 Essential (primary) hypertension: Secondary | ICD-10-CM

## 2015-07-12 DIAGNOSIS — R7309 Other abnormal glucose: Secondary | ICD-10-CM

## 2015-07-12 LAB — BASIC METABOLIC PANEL
Anion gap: 6 (ref 5–15)
BUN: 32 mg/dL — AB (ref 6–20)
CALCIUM: 9.3 mg/dL (ref 8.9–10.3)
CO2: 35 mmol/L — AB (ref 22–32)
CREATININE: 0.88 mg/dL (ref 0.61–1.24)
Chloride: 101 mmol/L (ref 101–111)
GFR calc non Af Amer: >60 mL/min (ref >60)
GLUCOSE: 233 mg/dL — AB (ref 65–99)
Potassium: 3.5 mmol/L (ref 3.5–5.1)
Sodium: 142 mmol/L (ref 135–145)

## 2015-07-12 LAB — GLUCOSE, CAPILLARY
GLUCOSE-CAPILLARY: 203 mg/dL — AB (ref 65–99)
GLUCOSE-CAPILLARY: 158 mg/dL — AB (ref 65–99)
GLUCOSE-CAPILLARY: 194 mg/dL — AB (ref 65–99)
Glucose-Capillary: 184 mg/dL — ABNORMAL HIGH (ref 65–99)

## 2015-07-12 LAB — CBC
HCT: 36.4 % — ABNORMAL LOW (ref 39.0–52.0)
Hemoglobin: 11.2 g/dL — ABNORMAL LOW (ref 13.0–17.0)
MCH: 32.9 pg (ref 26.0–34.0)
MCHC: 30.8 g/dL (ref 30.0–36.0)
MCV: 107.1 fL — ABNORMAL HIGH (ref 78.0–100.0)
PLATELETS: 298 10*3/uL (ref 150–400)
RBC: 3.4 MIL/uL — ABNORMAL LOW (ref 4.22–5.81)
RDW: 12.5 % (ref 11.5–15.5)
WBC: 18.5 10*3/uL — ABNORMAL HIGH (ref 4.0–10.5)

## 2015-07-12 MED ORDER — INSULIN ASPART 100 UNIT/ML ~~LOC~~ SOLN: 0.0000 [IU] | Freq: Every day | SUBCUTANEOUS | Status: DC

## 2015-07-12 MED ORDER — INSULIN ASPART 100 UNIT/ML ~~LOC~~ SOLN
0.0000 [IU] | Freq: Three times a day (TID) | SUBCUTANEOUS | Status: DC
  Administered 2015-07-12 – 2015-07-13 (×3): 3 [IU] via SUBCUTANEOUS

## 2015-07-12 MED ORDER — METHYLPREDNISOLONE SODIUM SUCC 125 MG IJ SOLR
60.0000 mg | Freq: Two times a day (BID) | INTRAMUSCULAR | Status: DC
  Administered 2015-07-12 – 2015-07-13 (×2): 60 mg via INTRAVENOUS
  Filled 2015-07-12 (×2): qty 2

## 2015-07-12 MED ORDER — IPRATROPIUM-ALBUTEROL 0.5-2.5 (3) MG/3ML IN SOLN
3.0000 mL | Freq: Three times a day (TID) | RESPIRATORY_TRACT | Status: DC
Start: 2015-07-13 — End: 2015-07-13
  Administered 2015-07-13: 3 mL via RESPIRATORY_TRACT
  Filled 2015-07-12: qty 3

## 2015-07-12 NOTE — Clinical Social Work Note (Signed)
Clinical Social Work Assessment  Patient Details  Name: Brendan Herring MRN: [id number] Date of Birth: [birth year]  Date of referral:  07/12/15               Reason for consult:  Facility Placement                Permission sought to share information with:  Facility Sport and exercise psychologist, Family Supports Permission granted to share information::  Yes, Verbal Permission Granted  Name::     Joelene Millin  Agency::  [facility]  Relationship::  Daughter  Contact Information:  [phone]  Housing/Transportation Living arrangements for the past 2 months:  [place] of Information:  Patient, Adult Children Patient Interpreter Needed:  None Criminal Activity/Legal Involvement Pertinent to Current Situation/Hospitalization:  No - Comment as needed Significant Relationships:  Adult Children Lives with:  Self Do you feel safe going back to the place where you live?  No Need for family participation in patient care:  Yes (Comment)  Care giving concerns:  CSW received referral for possible SNF placement at time of discharge. CSW met with patient and patient's daughter regarding PT recommendation of SNF placement at time of discharge. Per patient's daughter, patient's is currently unable to care for himself at his independent living facility given patient's current physical needs and fall risk. Patient and patient's daughter expressed understanding of PT recommendation and are agreeable to SNF placement at time of discharge. CSW to continue to follow and assist with discharge planning needs.   Social Worker assessment / plan:  Spoke with patient and patient's daughter, Joelene Millin, concerning possibility of rehab at [facility] before returning home. CSW spoke with [place] to see if patient could go to SNF at [facility]. [facility] does have a bed for patient at discharge.   Employment status:  Retired Nurse, adult PT  Recommendations:  [place] / Referral to community resources:  [place]  Patient/Family's Response to care:  Patient recognizes need for rehab before returning to his independent living facility and is agreeable to a SNF.   Patient/Family's Understanding of and Emotional Response to Diagnosis, Current Treatment, and Prognosis:  Patient is realistic regarding therapy needs. No questions/concerns about plan or treatment.    Emotional Assessment Appearance:  Appears stated age Attitude/Demeanor/Rapport:    Affect (typically observed):  Accepting, Quiet Orientation:  Oriented to Self, Oriented to Place, Oriented to  Time, Oriented to Situation Alcohol / Substance use:  Not Applicable Psych involvement (Current and /or in the community):  No (Comment)  Discharge Needs  Concerns to be addressed:  Care Coordination Readmission within the last 30 days:  No Current discharge risk:  None Barriers to Discharge:  Continued Medical Work up   [employer], LCSW 07/12/2015, 5:05 PM

## 2015-07-12 NOTE — Progress Notes (Signed)
OT Cancellation Note  Patient Details Name: Florene Glenorman L Hashem MRN: [id number] DOB: 03/23/1941   Cancelled Treatment:    Reason Eval/Treat Not Completed: Pt adamantly refusing today despite explanation re: need to increase activity and secondary effects of immobilization.  States he doesn't want to overdo do it after having nasal packing removed.  Will reattempt.   Angelene GiovanniConarpe, [place] Godman M  Aairah Negrette [place]onarpe, OTR/L [phone]  07/12/2015, 1:04 PM

## 2015-07-12 NOTE — Progress Notes (Signed)
[facility]                                                                              Patient Demographics  Brendan Herring, is a 74 y.o. male, DOB - 09/14/1940, ZOX:[id number]  Admit date - 07/10/2015   Admitting Physician Alba CoryBelkys A Regalado, MD  Outpatient Primary MD for the patient is  Duane Lopeoss, Alan, MD  LOS - 1   Chief Complaint  Patient presents with  . Epistaxis       Brief HPI   Brendan Herring is a 74 y.o. male with end stage COPD on 5 L oxygen per [place] at home . Patient woke up at 5:30 on the day of admission on 11/23 with a massive right sided nosebleed. This is his second major nosebleed in 3 months (last time left nare). Patient is not anticoagulated. In ED, clot was removed from the right near and Rhino packing was placed. However patient became tachycardiac, hypotensive and short of breath. He was placed on BiPAP in ED and CCM was consulted. Patient was admitted to the ICU for closer monitoring of the respiratory status. Patient was transferred to the floor on 11/24, [facility] hospitalist service assumed care on 11/25  Assessment & Plan    Principal Problem:   Acute respiratory failure with hypoxemia (HCC)- improving, off BiPAP -  He remains on Ventimask, wean as tolerated , baseline 5 L - Continue O2, scheduled bronchodilators, tapered Solu-Medrol to 60 mg every 12 hours   Active Problems:  Right Epistaxis - Currently improved, ENT following (Dr Suszanne Connerseoh), packing removed today    Transient Atrial fibrillation with RVR (HCC): New onset likely due to respiratory failure and tachypnea - Currently improved, continue home dose of Cardizem, - Not on anticoagulation, is not candidate for anticoagulation due to severe epistaxis - Hold aspirin due to bleeding    Essential hypertension - Currently stable, continue Cardizem    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 brimonidine  1 drop Left Eye BID  . calcium-vitamin D  1 tablet Oral Daily  . clindamycin (CLEOCIN) IV  600 mg Intravenous Q8H  . diltiazem  120 mg Oral Daily  . insulin aspart  0-9 Units Subcutaneous TID WC  . ipratropium-albuterol  3 mL Nebulization Q4H  . latanoprost  1 drop Both Eyes QHS  . levothyroxine  112 mcg Oral QAC breakfast  . methylPREDNISolone (SOLU-MEDROL) injection  60 mg Intravenous 3 times per day  . sodium chloride  3 mL Intravenous Q12H   Continuous Infusions:  PRN Meds:.acetaminophen **OR** acetaminophen, albuterol, hydrALAZINE, HYDROcodone-acetaminophen, ondansetron **OR** ondansetron (ZOFRAN) IV  Antibiotics   Anti-infectives    Start     Dose/Rate Route Frequency Ordered Stop   07/10/15 1930  clindamycin (CLEOCIN) IVPB 600 mg     600 mg 100 mL/hr over 30 Minutes Intravenous Every 8 hours 07/10/15 1304     07/10/15 1400  clindamycin (CLEOCIN) IVPB 600 mg  Status:  Discontinued     600 mg 100 mL/hr over 30 Minutes Intravenous 3 times per day 07/10/15 1258 07/10/15 1304   07/10/15 1100  clindamycin (CLEOCIN) IVPB 600 mg     600 mg 100 mL/hr over 30 Minutes Intravenous  Once 07/10/15 1054 07/10/15 1204        Subjective:   Brendan Herring was seen and examined today. No active bleeding, right sided nasal packing still on at the time of my examination. Patient denies dizziness, chest pain, shortness of breath, abdominal pain, N/V/D/C, new weakness, numbess, tingling. No  acute events overnight.    Objective:   Blood pressure 176/85, pulse 114, temperature 98.6 F (37 C), temperature source Oral, resp. rate 17, height  (1.854 m), weight 58.1 kg (128 lb 1.4 oz), SpO2 93 %.  Wt Readings from Last 3 Encounters:  07/11/15 58.1 kg (128 lb 1.4 oz)  04/10/15 60.147 kg (132 lb 9.6 oz)  03/08/15 58.514 kg (129 lb)     Intake/Output Summary (Last 24 hours) at 07/12/15 1224 Last data filed at 07/12/15 0933  Gross per 24 hour  Intake    990 ml  Output    425 ml  Net    565 ml    Exam  General: Alert and oriented x 3, NAD  HEENT:  PERRLA, EOMI, Anicteric Sclera, mucous membranes moist. Right near packing with a dried blood around right nostril no active bleeding  Neck: Supple, no JVD, no masses  CVS: S1 S2 auscultated, no rubs, murmurs or gallops. Regular rate and rhythm.  Respiratory: Diminished breath sounds throughout  Abdomen: Soft, nontender, nondistended, + bowel sounds  Ext: no cyanosis clubbing or edema  Neuro: AAOx3, Cr N's II- XII. Strength 5/5 upper and lower extremities bilaterally  Skin: No rashes  Psych: Normal affect and demeanor, alert and oriented x3    Data Review   Micro Results Recent Results (from the past 240 hour(s))  MRSA PCR Screening     Status: None   Collection Time: 07/10/15  1:30 PM  Result Value Ref Range Status   MRSA by PCR NEGATIVE NEGATIVE Final    Comment:        The GeneXpert MRSA Assay (FDA approved for NASAL specimens only), is one component of a comprehensive MRSA colonization surveillance program. It is not intended to diagnose MRSA infection nor to guide or monitor treatment for MRSA infections.     Radiology Reports Dg Chest Port 1 View  07/10/2015  CLINICAL DATA:  Shortness of breath. COPD. Acute respiratory failure with hypoxemia. EXAM: PORTABLE CHEST 1 VIEW COMPARISON:  03/08/2015 FINDINGS: Heart size is normal. Severe pulmonary hyperinflation again seen, consistent with COPD.  No evidence of pulmonary infiltrate or pleural effusion. Probable skin fold seen overlying peripheral right upper lung field. IMPRESSION: Severe COPD.  No acute findings. Electronically Signed   By: Myles Rosenthal M.D.   On: 07/10/2015 07:54    CBC  Recent Labs Lab 07/10/15 0647 07/10/15 1141 07/10/15 1355 07/11/15 0207 07/12/15 0415  WBC 12.2* 15.3* 16.1* 9.7 18.5*  HGB 11.8* 12.4* 12.6* 11.8* 11.2*  HCT 37.7* 39.6 40.0 37.6* 36.4*  PLT 313  356 366 291 298  MCV 107.4* 107.3* 106.7* 105.9* 107.1*  MCH 33.6 33.6 33.6 33.2 32.9  MCHC 31.3 31.3 31.5 31.4 30.8  RDW 12.4 12.4 12.4 12.3 12.5  LYMPHSABS 2.0 0.6*  --   --   --   MONOABS 1.7* 0.3  --   --   --   EOSABS 0.4 0.0  --   --   --   BASOSABS 0.1 0.0  --   --   --     Chemistries   Recent Labs Lab 07/10/15 0647 07/11/15 0207 07/12/15 0415  NA 144 145 142  K 3.5 3.8 3.5  CL 102 102 101  CO2 37* 35* 35*  GLUCOSE 181* 226* 233*  BUN 16 24* 32*  CREATININE 0.80 0.88 0.88  CALCIUM 9.3 9.4 9.3  AST 15  --   --   ALT 14*  --   --   ALKPHOS 62  --   --   BILITOT 0.5  --   --    ------------------------------------------------------------------------------------------------------------------ estimated creatinine clearance is 58.7 mL/min (by C-G formula based on Cr of 0.88). ------------------------------------------------------------------------------------------------------------------ No results for input(s): HGBA1C in the last 72 hours. ------------------------------------------------------------------------------------------------------------------ No results for input(s): CHOL, HDL, LDLCALC, TRIG, CHOLHDL, LDLDIRECT in the last 72 hours. ------------------------------------------------------------------------------------------------------------------ No results for input(s): TSH, T4TOTAL, T3FREE, THYROIDAB in the last 72 hours.  Invalid input(s):  FREET3 ------------------------------------------------------------------------------------------------------------------ No results for input(s): VITAMINB12, FOLATE, FERRITIN, TIBC, IRON, RETICCTPCT in the last 72 hours.  Coagulation profile  Recent Labs Lab 07/10/15 0647  INR 1.03    No results for input(s): DDIMER in the last 72 hours.  Cardiac Enzymes No results for input(s): CKMB, TROPONINI, MYOGLOBIN in the last 168 hours.  Invalid input(s): CK ------------------------------------------------------------------------------------------------------------------ Invalid input(s): POCBNP   Recent Labs  07/11/15 0752 07/11/15 1119 07/11/15 1715 07/11/15 2116 07/12/15 0814 07/12/15 1214  GLUCAP 226* 155* 157* 190* 203* 184*     Brendan Herring M.D. [facility] 07/12/2015, 12:24 PM  Pager: [phone] Between 7am to 7pm - call Pager - [phone]  After 7pm go to [email or web address] - password [email or web address]  Call night coverage person covering after 7pm

## 2015-07-12 NOTE — Progress Notes (Signed)
Subjective: No more bleeding. No new issues.   Objective: Vital signs in last 24 hours: Temp:  [95.5 F (35.3 C)-98.7 F (37.1 C)] 98.6 F (37 C) (11/25 0529) Pulse Rate:  [109-135] 114 (11/25 1038) Resp:  [16-22] 17 (11/25 0529) BP: (130-176)/(81-88) 176/85 mmHg (11/25 1038) SpO2:  [93 %-99 %] 93 % (11/25 0912) FiO2 (%):  [31 %] 31 % (11/25 0912)  PHYSICAL EXAMINATION: General: Chronically ill appearing male in no acute distress. On O2 facemask. Eyes: PERRL, EOMI. Ears: Normal auricles and EACs. Nose: Packing in place in Rt nare with dried blood around Rt nostril; no active bleeding noted from the left nostril Mouth: No lesion. Normal mucosa. Neck: Trachea midline. No LAD or mass.   Recent Labs  07/11/15 0207 07/12/15 0415  WBC 9.7 18.5*  HGB 11.8* 11.2*  HCT 37.6* 36.4*  PLT 291 298    Recent Labs  07/11/15 0207 07/12/15 0415  NA 145 142  K 3.8 3.5  CL 102 101  CO2 35* 35*  GLUCOSE 226* 233*  BUN 24* 32*  CREATININE 0.88 0.88  CALCIUM 9.4 9.3    Medications:  I have reviewed the patient's current medications. Scheduled: . brimonidine  1 drop Left Eye BID  . calcium-vitamin D  1 tablet Oral Daily  . clindamycin (CLEOCIN) IV  600 mg Intravenous Q8H  . diltiazem  120 mg Oral Daily  . insulin aspart  0-9 Units Subcutaneous TID WC  . ipratropium-albuterol  3 mL Nebulization Q4H  . latanoprost  1 drop Both Eyes QHS  . levothyroxine  112 mcg Oral QAC breakfast  . methylPREDNISolone (SOLU-MEDROL) injection  60 mg Intravenous 3 times per day  . sodium chloride  3 mL Intravenous Q12H   ZOX:[id number]:acetaminophen **OR** acetaminophen, albuterol, hydrALAZINE, HYDROcodone-acetaminophen, ondansetron **OR** ondansetron (ZOFRAN) IV  Assessment/Plan: Recent right epistaxis. Packing removed today. No bleeding noted.  If discharge home, pt may f/u in my office in 1-2 weeks.   LOS: 1 day   Brendan Herring,SUI W 07/12/2015, 11:08 AM

## 2015-07-12 NOTE — [place] FL2 (Signed)
[place] MEDICAID FL2 LEVEL OF CARE SCREENING TOOL     IDENTIFICATION  Patient Name: Brendan Herring Birthdate: [birth year] Sex: male Admission Date (Current Location): 07/10/2015  [facility] and [place] Number: Producer, television/film/video and Address:  The . [facility], [address] [place], [place] [address]      Provider Number: [id number]  Attending Physician Name and Address:  Cathren Harsh, MD  Relative Name and Phone Number:       Current Level of Care: Hospital Recommended Level of Care: Skilled Nursing Facility Prior Approval Number:    Date Approved/Denied:   PASRR Number: [id number]  Discharge Plan: SNF    Current Diagnoses: Patient Active Problem List   Diagnosis Date Noted  . Epistaxis 07/10/2015  . Atrial fibrillation with RVR (HCC) 07/10/2015  . Elevated blood pressure   . Protein-calorie malnutrition, severe (HCC) 02/27/2015  . Macrocytic anemia 02/26/2015  . Palliative care encounter 02/26/2015  . Shortness of breath 02/26/2015  . Acute respiratory failure with hypoxemia (HCC) 02/25/2015  . Steroid-induced hyperglycemia 02/25/2015  . COPD exacerbation (HCC) 02/25/2015  . Essential hypertension 12/27/2009  . CHRONIC RESPIRATORY FAILURE 12/27/2009  . Protein-calorie malnutrition (HCC) 11/27/2009  . 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 11/27/2009  . HYPERGLYCEMIA 11/27/2009    Orientation ACTIVITIES/SOCIAL BLADDER RESPIRATION    Self, Time, Situation, Place  Family supportive Continent O2 (As needed) (Venturi mask)  BEHAVIORAL SYMPTOMS/MOOD NEUROLOGICAL BOWEL NUTRITION STATUS      Continent  (See DC Summary)  PHYSICIAN VISITS COMMUNICATION OF NEEDS Height & Weight Skin    Verbally   128 lbs. Normal          AMBULATORY STATUS RESPIRATION    Assist extensive O2 (As needed) (Venturi mask)      Personal Care Assistance Level of Assistance  Bathing, Feeding, Dressing Bathing Assistance:  Maximum assistance Feeding assistance: Maximum assistance Dressing Assistance: Maximum assistance      Functional Limitations Info  Hearing   Hearing Info: Impaired         SPECIAL CARE FACTORS FREQUENCY  PT (By licensed PT)     PT Frequency: 5x/week             Additional Factors Info  Code Status, Allergies Code Status Info: DNR Allergies Info: Benazepril Hcl, Levofloxacin           Current Medications (07/12/2015): Current Facility-Administered Medications  Medication Dose Route Frequency Provider Last Rate Last Dose  . acetaminophen (TYLENOL) tablet 650 mg  650 mg Oral Q6H PRN Meredith Pel, NP       Or  . acetaminophen (TYLENOL) suppository 650 mg  650 mg Rectal Q6H PRN Meredith Pel, NP      . albuterol (PROVENTIL) (2.5 MG/3ML) 0.083% nebulizer solution 2.5 mg  2.5 mg Nebulization Q2H PRN Belkys A Regalado, MD      . brimonidine (ALPHAGAN) 0.2 % ophthalmic solution 1 drop  1 drop Left Eye BID Belkys A Regalado, MD   1 drop at 07/12/15 1034  . calcium-vitamin D (OSCAL WITH D) 500-200 MG-UNIT per tablet 1 tablet  1 tablet Oral Daily Meredith Pel, NP   1 tablet at 07/11/15 0947  . clindamycin (CLEOCIN) IVPB 600 mg  600 mg Intravenous Q8H Lupita Leash, MD   600 mg at 07/12/15 1145  . diltiazem (DILACOR XR) 24 hr capsule 120 mg  120 mg Oral Daily Meredith Pel, NP   120 mg at 07/12/15  1034  . hydrALAZINE (APRESOLINE) injection 10-20 mg  10-20 mg Intravenous Q6H PRN Leslye Peerobert S Byrum, MD      . HYDROcodone-acetaminophen (NORCO/VICODIN) 5-325 MG per tablet 1-2 tablet  1-2 tablet Oral Q4H PRN Meredith PelPaula M Guenther, NP      . insulin aspart (novoLOG) injection 0-15 Units  0-15 Units Subcutaneous TID WC Ripudeep Jenna LuoK Rai, MD   3 Units at 07/12/15 1240  . insulin aspart (novoLOG) injection 0-5 Units  0-5 Units Subcutaneous QHS Ripudeep K Rai, MD      . ipratropium-albuterol (DUONEB) 0.5-2.5 (3) MG/3ML nebulizer solution 3 mL  3 mL Nebulization Q4H Meredith PelPaula M Guenther, NP    3 mL at 07/12/15 1604  . latanoprost (XALATAN) 0.005 % ophthalmic solution 1 drop  1 drop Both Eyes QHS Belkys A Regalado, MD   1 drop at 07/11/15 2149  . levothyroxine (SYNTHROID, LEVOTHROID) tablet 112 mcg  112 mcg Oral QAC breakfast Alba CoryBelkys A Regalado, MD   112 mcg at 07/12/15 0838  . methylPREDNISolone sodium succinate (SOLU-MEDROL) 125 mg/2 mL injection 60 mg  60 mg Intravenous Q12H Ripudeep K Rai, MD      . ondansetron (ZOFRAN) tablet 4 mg  4 mg Oral Q6H PRN Meredith PelPaula M Guenther, NP       Or  . ondansetron [facility](ZOFRAN) injection 4 mg  4 mg Intravenous Q6H PRN Meredith PelPaula M Guenther, NP      . sodium chloride 0.9 % injection 3 mL  3 mL Intravenous Q12H Meredith PelPaula M Guenther, NP   3 mL at 07/12/15 1035   Do not use this list as official medication orders. Please verify with discharge summary.  Discharge Medications:   Medication List    ASK your doctor about these medications        acetaZOLAMIDE 250 MG tablet  Commonly known as:  DIAMOX  TAKE 1 TABLET IN THE MORNING.     albuterol 108 (90 BASE) MCG/ACT inhaler  Commonly known as:  PROAIR HFA  Inhale 2 puffs into the lungs every 4 (four) hours as needed for shortness of breath.     albuterol (2.5 MG/3ML) 0.083% nebulizer solution  Commonly known as:  PROVENTIL  Take 3 mLs (2.5 mg total) by nebulization 2 (two) times daily. DX J44.1     ALPHAGAN P 0.1 % Soln  Generic drug:  brimonidine  Place 1 drop into the left eye 2 (two) times daily.     aspirin 81 MG tablet  Take 81 mg by mouth daily.     budesonide-formoterol 160-4.5 MCG/ACT inhaler  Commonly known as:  SYMBICORT  Inhale 2 puffs into the lungs 2 (two) times daily.     CALCIUM 600-D 600-400 MG-UNIT tablet  Generic drug:  Calcium Carbonate-Vitamin D  Take 1 tablet by mouth daily.     cholecalciferol 1000 UNITS tablet  Commonly known as:  VITAMIN D  Take 1,000 Units by mouth daily.     diltiazem 120 MG 24 hr capsule  Commonly known as:  CARDIZEM CD  Take 120 mg by mouth daily.      levothyroxine 112 MCG tablet  Commonly known as:  SYNTHROID, LEVOTHROID  Take 112 mcg by mouth daily.     lisinopril 20 MG tablet  Commonly known as:  PRINIVIL,ZESTRIL  Take 40 mg by mouth daily.     LUMIGAN 0.01 % Soln  Generic drug:  bimatoprost  Place 1 drop into both eyes at bedtime.     NON FORMULARY  Oxygen 3.5p-4 Liters daily.  predniSONE 10 MG tablet  Commonly known as:  DELTASONE  Once daily     PROBIOTIC PO  Take 1 tablet by mouth daily.     Tiotropium Bromide Monohydrate 2.5 MCG/ACT Aers  Commonly known as:  SPIRIVA RESPIMAT  Take 1 puff by mouth daily.        Relevant Imaging Results:  Relevant Lab Results:  Recent Labs    Additional Information    Renne Crigler Renella Steig, LCSW

## 2015-07-12 NOTE — Progress Notes (Signed)
CSW confirmed with patient and patient's daughter that patient will dc to [facility], [facility] [address] upon discharge. Pt will be transported by [facility].   [facility]: Catha BrowCynthia Simpson, [phone]  Cristobal GoldmannNadia Aily Tzeng HebronLCSWA [phone]

## 2015-07-12 NOTE — Evaluation (Signed)
Physical Therapy Evaluation Patient Details Name: Brendan Herring L Parrott MRN: [id number] DOB: 02/01/1941 Today's Date: 07/12/2015   History of Present Illness  Brendan Herring L Schweiger is a 74 y.o. male with end stage COPD on 5 L oxygen per [place] at home . Patient woke up at 5:30 this morning and with a massive right sided nosebleed. This is his second major nosebleed in 3 months (last time left nare). Patient is not anticoagulated  Clinical Impression  Patient presents with decreased mobility due to deficits listed in PT problem list.  He will benefit from skilled PT in the acute setting to allow return home following SNF rehab stay.  Patient limited tolerance with any activity this am, but agreeable to OOB to chair for breakfast.  Will continue as tolerated.    Follow Up Recommendations SNF    Equipment Recommendations  None recommended by PT    Recommendations for Other Services       Precautions / Restrictions Precautions Precautions: Fall      Mobility  Bed Mobility Overal bed mobility: Needs Assistance Bed Mobility: Supine to Sit     Supine to sit: HOB elevated;Supervision     General bed mobility comments: increased time and effortful  Transfers Overall transfer level: Needs assistance Equipment used: Rolling walker (2 wheeled) Transfers: Sit to/from UGI CorporationStand;Stand Pivot Transfers Sit to Stand: Supervision Stand pivot transfers: Min guard       General transfer comment: up from bed with UE support and supervision for safety with O2 tubing, slightly impulsive due to difficulty breathing; assist for safety with walker stand step to chair  Ambulation/Gait                Stairs            Wheelchair Mobility    Modified Rankin (Stroke Patients Only)       Balance Overall balance assessment: Needs assistance           Standing balance-[name]: Fair Standing balance comment: static stance without UE support, but due to breathing issues, general weakness needs  UE support for ambulation                             Pertinent Vitals/Pain Pain Assessment: No/denies pain    Home Living Family/patient expects to be discharged to:: Skilled nursing facility Living Arrangements: Alone Available Help at Discharge: Personal care attendant;Available PRN/intermittently Type of Home: Independent living facility       Home Layout: One level Home Equipment: Walker - 4 wheels;Grab bars - tub/shower;Grab bars - toilet;Other (comment) (home O2)      Prior Function Level of Independence: Independent with assistive device(s)         Comments: goes to dining hall for meals     Hand Dominance        Extremity/Trunk Assessment   Upper Extremity Assessment: Generalized weakness           Lower Extremity Assessment: Generalized weakness         Communication   Communication: HOH  Cognition Arousal/Alertness: Awake/alert Behavior During Therapy: WFL for tasks assessed/performed Overall Cognitive Status: Within Functional Limits for tasks assessed                      General Comments      Exercises        Assessment/Plan    PT Assessment Patient needs continued PT services  PT Diagnosis Generalized  weakness;Difficulty walking   PT Problem List Decreased strength;Cardiopulmonary status limiting activity;Decreased mobility;Decreased activity tolerance;Decreased safety awareness  PT Treatment Interventions DME instruction;Balance training;Gait training;Functional mobility training;Patient/family education;Therapeutic activities;Therapeutic exercise   PT Goals (Current goals can be found in the Care Plan section) Acute Rehab PT Goals Patient Stated Goal: To breathe better PT Goal Formulation: With patient Time For Goal Achievement: 07/26/15 Potential to Achieve Goals: Fair    Frequency Min 2X/week   Barriers to discharge        Co-evaluation               End of Session Equipment Utilized During  Treatment: Oxygen Activity Tolerance: Patient limited by fatigue;Treatment limited secondary to medical complications (Comment) (HR up to 130's RR in 30's-40's) Patient left: with call bell/phone within reach;in chair;with chair alarm set           Time: 1610-9604 PT Time Calculation (min) (ACUTE ONLY): 17 min   Charges:   PT Evaluation $Initial PT Evaluation Tier I: 1 Procedure     PT G Codes:        WYNN,CYNDI 2015-08-10, 9:17 AM  Sheran Lawless, PT [phone] 2015/08/10

## 2015-07-13 DIAGNOSIS — J439 Emphysema, unspecified: Secondary | ICD-10-CM

## 2015-07-13 LAB — GLUCOSE, CAPILLARY: Glucose-Capillary: 180 mg/dL — ABNORMAL HIGH (ref 65–99)

## 2015-07-13 LAB — HEMOGLOBIN A1C
Hgb A1c MFr Bld: 6.4 % — ABNORMAL HIGH (ref 4.8–5.6)
Mean Plasma Glucose: 137 mg/dL

## 2015-07-13 MED ORDER — AMOXICILLIN-POT CLAVULANATE 875-125 MG PO TABS: 1.0000 | ORAL_TABLET | Freq: Two times a day (BID) | ORAL | Status: DC

## 2015-07-13 MED ORDER — PREDNISONE 10 MG PO TABS
ORAL_TABLET | ORAL | Status: DC
Start: 2015-07-14 — End: 2015-08-15

## 2015-07-13 MED ORDER — PREDNISONE 10 MG PO TABS: ORAL_TABLET | ORAL | Status: DC

## 2015-07-13 MED ORDER — PREDNISONE 10 MG PO TABS
60.0000 mg | ORAL_TABLET | Freq: Once | ORAL | Status: AC
  Administered 2015-07-13: 60 mg via ORAL
  Filled 2015-07-13: qty 1

## 2015-07-13 MED ORDER — PREDNISONE 20 MG PO TABS: 40.0000 mg | ORAL_TABLET | Freq: Every day | ORAL | Status: DC

## 2015-07-13 MED ORDER — ALBUTEROL SULFATE (2.5 MG/3ML) 0.083% IN NEBU: 2.5000 mg | INHALATION_SOLUTION | Freq: Three times a day (TID) | RESPIRATORY_TRACT | Status: DC

## 2015-07-13 MED ORDER — BUDESONIDE-FORMOTEROL FUMARATE 160-4.5 MCG/ACT IN AERO
2.0000 | INHALATION_SPRAY | Freq: Two times a day (BID) | RESPIRATORY_TRACT | Status: DC
  Filled 2015-07-13: qty 6

## 2015-07-13 MED ORDER — AMOXICILLIN-POT CLAVULANATE 875-125 MG PO TABS
1.0000 | ORAL_TABLET | Freq: Two times a day (BID) | ORAL | Status: DC
  Administered 2015-07-13: 1 via ORAL
  Filled 2015-07-13: qty 1

## 2015-07-13 NOTE — Progress Notes (Signed)
Subjective: No more bleeding. No new issues.  Objective: Vital signs in last 24 hours: Temp:  [97.9 F (36.6 C)-98.6 F (37 C)] 98.1 F (36.7 C) (11/26 0604) Pulse Rate:  [68-114] 106 (11/26 0604) Resp:  [16-18] 18 (11/26 0604) BP: (152-176)/(74-99) 156/90 mmHg (11/26 0604) SpO2:  [93 %-100 %] 99 % (11/26 0757) FiO2 (%):  [31 %] 31 % (11/25 1604)  PHYSICAL EXAMINATION: General: Chronically ill appearing male in no acute distress. On O2 via nasal canula. Eyes: PERRL, EOMI. Ears: Normal auricles and EACs. Nose: No bleeding. Mouth: No lesion. Normal mucosa. Neck: Trachea midline. No LAD or mass.   Recent Labs  07/11/15 0207 07/12/15 0415  WBC 9.7 18.5*  HGB 11.8* 11.2*  HCT 37.6* 36.4*  PLT 291 298    Recent Labs  07/11/15 0207 07/12/15 0415  NA 145 142  K 3.8 3.5  CL 102 101  CO2 35* 35*  GLUCOSE 226* 233*  BUN 24* 32*  CREATININE 0.88 0.88  CALCIUM 9.4 9.3    Medications:  I have reviewed the patient's current medications. Scheduled: . amoxicillin-clavulanate  1 tablet Oral Q12H  . brimonidine  1 drop Left Eye BID  . budesonide-formoterol  2 puff Inhalation BID  . calcium-vitamin D  1 tablet Oral Daily  . diltiazem  120 mg Oral Daily  . insulin aspart  0-15 Units Subcutaneous TID WC  . insulin aspart  0-5 Units Subcutaneous QHS  . ipratropium-albuterol  3 mL Nebulization TID  . latanoprost  1 drop Both Eyes QHS  . levothyroxine  112 mcg Oral QAC breakfast  . [START ON 07/14/2015] predniSONE  40 mg Oral Q breakfast  . predniSONE  60 mg Oral Once  . sodium chloride  3 mL Intravenous Q12H   ZOX:[id number]:acetaminophen **OR** acetaminophen, albuterol, hydrALAZINE, HYDROcodone-acetaminophen, ondansetron **OR** ondansetron (ZOFRAN) IV  Assessment/Plan: Recent right epistaxis. Packing removed yesterday. No bleeding noted. If discharge home, pt may f/u in my office in 1-2 weeks.    LOS: 2 days   Kerston Landeck,SUI W 07/13/2015, 9:21 AM

## 2015-07-13 NOTE — Discharge Summary (Addendum)
Physician Discharge Summary   Patient ID: Brendan Herring MRN: [id number] DOB/AGE: 1941-05-28 74 y.o.  Admit date: 07/10/2015 Discharge date: 07/13/2015  Primary Care Physician:   Duane Lope, MD  Discharge Diagnoses:    . Epistaxis resolved  . Acute respiratory failure with hypoxemia (HCC) . 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 . COPD exacerbation (HCC) . Atrial fibrillation with RVR (HCC) . Essential hypertension . HYPERGLYCEMIA  Consults:   Critical care service ENT, Dr. Suszanne Conners  Outpatient Follow-up:  1. PT evaluation recommended skilled nursing facility 2. Please repeat CBC/BMET at next visit 3. Please note aspirin has been stopped due to severe epistaxis    DIET: Heart healthy diet     Allergies:   Allergies  Allergen Reactions  . Benazepril Hcl     REACTION: sensitive to sunlight  . Levofloxacin     REACTION: tendonitis     DISCHARGE MEDICATIONS: Current Discharge Medication List    START taking these medications   Details  amoxicillin-clavulanate (AUGMENTIN) 875-125 MG tablet Take 1 tablet by mouth 2 (two) times daily. X 7days      CONTINUE these medications which have CHANGED   Details  albuterol (PROVENTIL) (2.5 MG/3ML) 0.083% nebulizer solution Take 3 mLs (2.5 mg total) by nebulization 3 (three) times daily. DX J44.1 Qty: 75 mL, Refills: 6    !! predniSONE (DELTASONE) 10 MG tablet Once daily Please continue Prednisone  daily after taper is complete Qty: 90 tablet, Refills: 2   Associated Diagnoses: Centrilobular emphysema (HCC); Chronic respiratory failure, unspecified whether with hypoxia or hypercapnia (HCC); Essential hypertension; Protein-calorie malnutrition (HCC)    !! predniSONE (DELTASONE) 10 MG tablet Prednisone dosing: Take  Prednisone  (4 tabs) x 3 days, then taper to  (3 tabs) x 3 days, then  (2 tabs) x 3days, then continue  daily     !! - Potential duplicate medications found.  Please discuss with provider.    CONTINUE these medications which have NOT CHANGED   Details  acetaZOLAMIDE (DIAMOX) 250 MG tablet TAKE 1 TABLET IN THE MORNING. Qty: 30 tablet, Refills: 3    albuterol (PROAIR HFA) 108 (90 BASE) MCG/ACT inhaler Inhale 2 puffs into the lungs every 4 (four) hours as needed for shortness of breath.    ALPHAGAN P 0.1 % SOLN Place 1 drop into the left eye 2 (two) times daily.    budesonide-formoterol (SYMBICORT) 160-4.5 MCG/ACT inhaler Inhale 2 puffs into the lungs 2 (two) times daily. Qty: 3 Inhaler, Refills: 2   Associated Diagnoses: Centrilobular emphysema (HCC); Chronic respiratory failure, unspecified whether with hypoxia or hypercapnia (HCC); Essential hypertension; Protein-calorie malnutrition (HCC)    Calcium Carbonate-Vitamin D (CALCIUM 600-D) 600-400 MG-UNIT per tablet Take 1 tablet by mouth daily.     cholecalciferol (VITAMIN D) 1000 UNITS tablet Take 1,000 Units by mouth daily.    diltiazem (CARDIZEM CD) 120 MG 24 hr capsule Take 120 mg by mouth daily.    levothyroxine (SYNTHROID, LEVOTHROID) 112 MCG tablet Take 112 mcg by mouth daily.    lisinopril (PRINIVIL,ZESTRIL) 20 MG tablet Take 40 mg by mouth daily.     LUMIGAN 0.01 % SOLN Place 1 drop into both eyes at bedtime.    Probiotic Product (PROBIOTIC PO) Take 1 tablet by mouth daily.    Tiotropium Bromide Monohydrate (SPIRIVA RESPIMAT) 2.5 MCG/ACT AERS Take 1 puff by mouth daily. Qty: 3 Inhaler, Refills: 2   Associated Diagnoses: Centrilobular emphysema (HCC); Chronic respiratory failure, unspecified whether with hypoxia or hypercapnia (  HCC); Essential hypertension; Protein-calorie malnutrition (HCC)    NON FORMULARY Oxygen 3.5p-4 Liters daily.      STOP taking these medications     aspirin 81 MG tablet      diltiazem (CARDIZEM) 120 MG tablet          Brief H and P: For complete details please refer to admission H and P, but in brief  Brendan Herring is a 74 y.o. male with end  stage COPD on 5 L oxygen per [place] at home . Patient woke up at 5:30 on the day of admission on 11/23 with a massive right sided nosebleed. This is his second major nosebleed in 3 months (last time left nare). Patient is not anticoagulated. In ED, clot was removed from the right near and Rhino packing was placed. However patient became tachycardiac, hypotensive and short of breath. He was placed on BiPAP in ED and CCM was consulted. Patient was admitted to the ICU for closer monitoring of the respiratory status. Patient was transferred to the floor on 11/24, [facility] service assumed care on 11/25  Hospital Course:  Acute respiratory failure with hypoxemia (HCC)- improving, off BiPAP -Significantly improved, currently on nasal cannula, O2 sats 99% on 3 L at baseline  - Continue O2, scheduled bronchodilators, transitioned to oral prednisone, start prednisone taper tomorrow until 20 mg is complete and then continue maintenance dose of 10 mg daily - Use humidified O2 via nasal cannula, avoid dry nose or picking nose.   Right Epistaxis -Resolved, patient was followed closely by ENT (Dr Suszanne Conners), packing removed on 07/12/15. Patient remained stable had no repeat episodes of epistaxis. H&H remained stable. 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 with exacerbation - Currently improving, continue scheduled bronchodilators, O2 as tolerated   HYPERGLYCEMIA - Continue carb modified diet.   Day of  Discharge BP 156/90 mmHg  Pulse 106  Temp(Src) 98.1 F (36.7 C) (Oral)  Resp 18  Ht  (1.854 m)  Wt 58.1 kg (128 lb 1.4 oz)  BMI 16.90 kg/m2  SpO2 99%  Physical Exam: General: Alert and awake oriented x3 not in any acute distress. HEENT: anicteric sclera, pupils reactive to light and accommodation CVS: S1-S2 clear no murmur rubs or gallops Chest: decreased breath sounds at the bases, no wheezing Abdomen: soft nontender, nondistended, normal bowel sounds Extremities: no cyanosis, clubbing or edema noted bilaterally Neuro: Cranial nerves II-XII intact, no focal neurological deficits   The results of significant diagnostics from this hospitalization (including imaging, microbiology, ancillary and laboratory) are listed below for reference.    LAB RESULTS: Basic Metabolic Panel:  Recent Labs Lab 07/11/15 0207 07/12/15 0415  NA 145 142  K 3.8 3.5  CL 102 101  CO2 35* 35*  GLUCOSE 226* 233*  BUN 24* 32*  CREATININE 0.88 0.88  CALCIUM 9.4 9.3   Liver Function Tests:  Recent Labs Lab 07/10/15 0647  AST 15  ALT 14*  ALKPHOS 62  BILITOT 0.5  PROT 6.1*  ALBUMIN 3.1*   No results for input(s): LIPASE,  AMYLASE in the last 168 hours. No results for input(s): AMMONIA in the last 168 hours. CBC:  Recent Labs Lab 07/10/15 1141  07/11/15 0207 07/12/15 0415  WBC 15.3*  < > 9.7 18.5*  NEUTROABS 14.4*  --   --   --   HGB 12.4*  < > 11.8* 11.2*  HCT 39.6  < > 37.6* 36.4*  MCV 107.3*  < > 105.9* 107.1*  PLT 356  < > 291 298  < > = values in this interval not displayed. Cardiac Enzymes: No results for input(s): CKTOTAL, CKMB, CKMBINDEX, TROPONINI in the last 168 hours. BNP: Invalid input(s): POCBNP CBG:  Recent Labs Lab 07/12/15 2152 07/13/15 0749  GLUCAP 194* 180*    Significant Diagnostic Studies:  Dg Chest Port 1 View  07/10/2015  CLINICAL DATA:  Shortness of breath. COPD. Acute respiratory failure with hypoxemia. EXAM: PORTABLE CHEST 1 VIEW  COMPARISON:  03/08/2015 FINDINGS: Heart size is normal. Severe pulmonary hyperinflation again seen, consistent with COPD. No evidence of pulmonary infiltrate or pleural effusion. Probable skin fold seen overlying peripheral right upper lung field. IMPRESSION: Severe COPD.  No acute findings. Electronically Signed   By: Myles RosenthalJohn  Stahl M.D.   On: 07/10/2015 07:54    2D ECHO:   Disposition and Follow-up: Discharge Instructions    Diet - low sodium heart healthy    Complete by:  As directed      Discharge instructions    Complete by:  As directed   Please use humidified O2 via nasal cannula, saline sprays. Avoid drying of the nose, picking nose.     Increase activity slowly    Complete by:  As directed             DISPOSITION:  skilled nursing facility   DISCHARGE FOLLOW-UP Follow-up Information    Follow up with [facility].   Specialties:  Skilled [facility]ing Facility, Assisted Living Company secretaryacility   Contact information:   [address] [place] [id number] [phone]      Follow up with Darletta MollEOH,SUI W, MD. Schedule an appointment as soon as possible for a visit in 2 weeks.   Specialty:  Otolaryngology   Why:  for hospital follow-up   Contact information:   [address] [place] [address] [phone]       Follow up with  Duane Lopeoss, Alan, MD. Schedule an appointment as soon as possible for a visit in 2 weeks.   Specialty:  Family Medicine   Why:  for hospital follow-up   Contact information:   [address] [place] [address] [phone]        Time spent on Discharge  35 minutes   Signed:   RAI,RIPUDEEP M.D. [facility] 07/13/2015, 10:53 AM Pager: [phone]   Coding Query  Severe protein calorie malnutrition in context of chronic illness   RAI,RIPUDEEP M.D. [facility] 07/22/2015, 12:46 PM  Pager: [phone]

## 2015-07-13 NOTE — Progress Notes (Signed)
Report called to nurse cynthia at [facility].

## 2015-07-13 NOTE — Progress Notes (Addendum)
MD Rai asked to contact social worker to discuss patient leaving today. Patient needs skilled Nursing so social worker will need to see if a bed is available [facility] SN[facility]  9:13 AM social worker called but no answer will try again   10:20 AM Social worked contacted and will verify the plans for the patient and call me back  10:45 AM  Everything is set up for the [facility].  SW will bring paperwork and [facility] be called [facility]

## 2015-07-13 NOTE — Progress Notes (Signed)
Brendan Herring to be D/C'd Skilled nursing facility per MD order.  Discussed with the patient and all questions fully answered.  VSS, Skin clean, dry and intact without evidence of skin break down, no evidence of skin tears noted. IV catheter discontinued intact. Site without signs and symptoms of complications. Dressing and pressure applied.  An After Visit Summary was printed and given to the patient. Patient received prescription.  D/c education completed with patient/family including follow up instructions, medication list, d/c activities limitations if indicated, with other d/c instructions as indicated by MD - patient able to verbalize understanding, all questions fully answered.   Patient instructed to return to ED, call 911, or call MD for any changes in condition.   Patient escorted via stretcher, and [facility]  Pura SpiceJessica K Edwards 07/13/2015 12:36 PM

## 2015-07-13 NOTE — Progress Notes (Signed)
Patient asked RN to call daughter to let her know he is being discharged

## 2015-07-15 ENCOUNTER — Non-Acute Institutional Stay (SKILLED_NURSING_FACILITY): Payer: Medicare Other | Admitting: Nurse Practitioner

## 2015-07-15 ENCOUNTER — Encounter: Payer: Self-pay | Admitting: Nurse Practitioner

## 2015-07-15 DIAGNOSIS — I10 Essential (primary) hypertension: Secondary | ICD-10-CM

## 2015-07-15 DIAGNOSIS — E039 Hypothyroidism, unspecified: Secondary | ICD-10-CM

## 2015-07-15 DIAGNOSIS — J439 Emphysema, unspecified: Secondary | ICD-10-CM | POA: Diagnosis not present

## 2015-07-15 DIAGNOSIS — I4891 Unspecified atrial fibrillation: Secondary | ICD-10-CM | POA: Diagnosis not present

## 2015-07-15 DIAGNOSIS — R7309 Other abnormal glucose: Secondary | ICD-10-CM | POA: Diagnosis not present

## 2015-07-15 DIAGNOSIS — R040 Epistaxis: Secondary | ICD-10-CM | POA: Diagnosis not present

## 2015-07-15 NOTE — Progress Notes (Signed)
Patient ID: Brendan Herring, male   DOB: [birth year], 74 y.o.   MRN: [id number]  Location:  SNF [facility] Provider:  Chipper Oman NP  Code Status:  DNR Goals of care: Advanced Directive information    Chief Complaint  Patient presents with  . Medical Management of Chronic Issues     HPI: Patient is a 74 y.o. male seen in the SNF at [facility] today for evaluation of hospitalization 07/10/15-07/13/15 for a massive right sided nosebleed. This is his second major nosebleed in 3 months (last time left nare). end stage of COPD exacerbation. Complete Prednisone taper dose and 7 day course Augmentin bid at SNF.   Review of Systems  Constitutional: Negative for fever.  HENT: Negative for congestion, ear pain, nosebleeds and sore throat.   Eyes: Negative for redness.  Respiratory: Positive for shortness of breath. Negative for wheezing.   Cardiovascular: Negative for palpitations and leg swelling.  Gastrointestinal: Negative for nausea and vomiting.  Genitourinary: Negative for dysuria.  Skin: Negative for rash.  Neurological: Negative for headaches.  Endo/Heme/Allergies: Does not bruise/bleed easily.  Psychiatric/Behavioral: The patient is not nervous/anxious.     Past Medical History  Diagnosis Date  . Emphysema   . Hypertension   . Coronary artery disease   . Thyroid disease   . Glaucoma   . Hypercholesterolemia   . COPD (chronic obstructive pulmonary disease) [facility])     Patient Active Problem List   Diagnosis Date Noted  . Hypothyroidism 07/16/2015  . Epistaxis 07/10/2015  . Atrial fibrillation with RVR (HCC) 07/10/2015  . Elevated blood pressure   . Protein-calorie malnutrition, severe (HCC) 02/27/2015  . Macrocytic anemia 02/26/2015  . Palliative care encounter 02/26/2015  . Shortness of breath 02/26/2015  . Acute respiratory failure with hypoxemia (HCC) 02/25/2015  . Steroid-induced hyperglycemia 02/25/2015  . COPD exacerbation (HCC) 02/25/2015  . Essential  hypertension 12/27/2009  . CHRONIC RESPIRATORY FAILURE 12/27/2009  . Protein-calorie malnutrition (HCC) 11/27/2009  . 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 11/27/2009  . HYPERGLYCEMIA 11/27/2009    Allergies  Allergen Reactions  . Benazepril Hcl     REACTION: sensitive to sunlight  . Levofloxacin     REACTION: tendonitis    Medications: Patient's Medications  New Prescriptions   No medications on file  Previous Medications   ACETAZOLAMIDE (DIAMOX) 250 MG TABLET    TAKE 1 TABLET IN THE MORNING.   ALBUTEROL (PROAIR HFA) 108 (90 BASE) MCG/ACT INHALER    Inhale 2 puffs into the lungs every 4 (four) hours as needed for shortness of breath.   ALBUTEROL (PROVENTIL) (2.5 MG/3ML) 0.083% NEBULIZER SOLUTION    Take 3 mLs (2.5 mg total) by nebulization 3 (three) times daily. DX J44.1   ALPHAGAN P 0.1 % SOLN    Place 1 drop into the left eye 2 (two) times daily.   AMOXICILLIN-CLAVULANATE (AUGMENTIN) 875-125 MG TABLET    Take 1 tablet by mouth 2 (two) times daily. X 7days   BUDESONIDE-FORMOTEROL (SYMBICORT) 160-4.5 MCG/ACT INHALER    Inhale 2 puffs into the lungs 2 (two) times daily.   CALCIUM CARBONATE-VITAMIN D (CALCIUM 600-D) 600-400 MG-UNIT PER TABLET    Take 1 tablet by mouth daily.    CHOLECALCIFEROL (VITAMIN D) 1000 UNITS TABLET    Take 1,000 Units by mouth daily.   DILTIAZEM (CARDIZEM CD) 120 MG 24 HR CAPSULE    Take 120 mg by mouth daily.   LEVOTHYROXINE (SYNTHROID, LEVOTHROID) 112 MCG TABLET  Take 112 mcg by mouth daily.   LISINOPRIL (PRINIVIL,ZESTRIL) 20 MG TABLET    Take 40 mg by mouth daily.    LUMIGAN 0.01 % SOLN    Place 1 drop into both eyes at bedtime.   NON FORMULARY    Oxygen 3.5p-4 Liters daily.   PREDNISONE (DELTASONE) 10 MG TABLET    Once daily Please continue Prednisone 10mg  daily after taper is complete   PREDNISONE (DELTASONE) 10 MG TABLET    Prednisone dosing: Take  Prednisone 40mg  (4 tabs) x 3 days, then taper to 30mg  (3 tabs) x 3  days, then 20mg  (2 tabs) x 3days, then continue 10mg  daily   PROBIOTIC PRODUCT (PROBIOTIC PO)    Take 1 tablet by mouth daily.   TIOTROPIUM BROMIDE MONOHYDRATE (SPIRIVA RESPIMAT) 2.5 MCG/ACT AERS    Take 1 puff by mouth daily.  Modified Medications   No medications on file  Discontinued Medications   No medications on file    Physical Exam: Filed Vitals:   07/15/15 1540  BP: 130/82  Pulse: 98  Temp: 97.8 F (36.6 C)  TempSrc: Tympanic  Resp: 20   There is no weight on file to calculate BMI.  Physical Exam  Constitutional: He is oriented to person, place, and time. He appears well-developed and well-nourished. No distress.  HENT:  Head: Normocephalic and atraumatic.  Right Ear: External ear normal.  Left Ear: External ear normal.  Nose: Nose normal.  Mouth/Throat: Oropharynx is clear and moist.  Eyes: Conjunctivae and EOM are normal. Pupils are equal, round, and reactive to light. Right eye exhibits no discharge. Left eye exhibits no discharge. No scleral icterus.  Neck: Normal range of motion. Neck supple. No JVD present. No tracheal deviation present. No thyromegaly present.  Cardiovascular: Normal rate, regular rhythm and normal heart sounds.  Exam reveals no gallop and no friction rub.   No murmur heard. Pulmonary/Chest: Effort normal. No stridor. No respiratory distress. He has no wheezes. He has no rales. He exhibits no tenderness.  Decreased breath sound bilaterally.   Abdominal: Soft. Bowel sounds are normal. He exhibits no distension and no mass. There is no tenderness. There is no rebound and no guarding. No hernia.  Musculoskeletal: Normal range of motion. He exhibits no edema or tenderness.  Lymphadenopathy:    He has no cervical adenopathy.  Neurological: He is alert and oriented to person, place, and time. He has normal reflexes. He displays normal reflexes. No cranial nerve deficit. He exhibits normal muscle tone. Coordination normal.  Skin: Skin is warm and dry.  No rash noted. He is not diaphoretic. No erythema. No pallor.  Psychiatric: He has a normal mood and affect. His behavior is normal. Thought content normal.    Labs reviewed: Basic Metabolic Panel:  Recent Labs  28/41/3211/23/16 0647 07/11/15 0207 07/12/15 0415  NA 144 145 142  K 3.5 3.8 3.5  CL 102 102 101  CO2 37* 35* 35*  GLUCOSE 181* 226* 233*  BUN 16 24* 32*  CREATININE 0.80 0.88 0.88  CALCIUM 9.3 9.4 9.3    Liver Function Tests:  Recent Labs  02/26/15 0320 04/10/15 1228 07/10/15 0647  AST 19 14 15   ALT 20 15 14*  ALKPHOS 57 48 62  BILITOT 0.2* 0.4 0.5  PROT 6.2* 6.8 6.1*  ALBUMIN 2.9* 4.2 3.1*    CBC:  Recent Labs  02/25/15 1653  07/10/15 0647 07/10/15 1141 07/10/15 1355 07/11/15 0207 07/12/15 0415  WBC 8.8  < > 12.2* 15.3* 16.1*  9.7 18.5*  NEUTROABS 6.0  --  8.0* 14.4*  --   --   --   HGB 13.1  < > 11.8* 12.4* 12.6* 11.8* 11.2*  HCT 41.4  < > 37.7* 39.6 40.0 37.6* 36.4*  MCV 104.3*  < > 107.4* 107.3* 106.7* 105.9* 107.1*  PLT 224  < > 313 356 366 291 298  < > = values in this interval not displayed.  Lab Results  Component Value Date   TSH 1.302 02/25/2015   Lab Results  Component Value Date   HGBA1C 6.4* 07/12/2015   No results found for: CHOL, HDL, LDLCALC, LDLDIRECT, TRIG, CHOLHDL  Significant Diagnostic Results since last visit: none  Patient Care Team: Gildardo Cranker, MD as PCP - General (Family Medicine)  Assessment/Plan Problem List Items Addressed This Visit    Essential hypertension    Currently stable, continue Cardizem , Lisinopril  daily.       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 - Primary    end stage COPD on 5 L oxygen per [place] at home. On chronic prednisone at /day      HYPERGLYCEMIA     HYPERGLYCEMIA - Continue carb modified diet.      Epistaxis    a massive right sided nosebleed. This is his second major nosebleed in 3 months (last time left nare). ENT consultation  while in hospital.       Atrial fibrillation with RVR (HCC)     Transient Atrial fibrillation with RVR (HCC): New onset likely due to respiratory failure and tachypnea - Currently improved, continue home dose of Cardizem 120 mg daily - Not on anticoagulation, is not candidate for anticoagulation due to severe epistaxis - Hold aspirin due to bleeding      Hypothyroidism    Taking Synthroid daily. Update TSH          Family/ staff Communication: [place] when [place].   Labs/tests ordered: CBC, BMP, TSH  ManXie Amyria Komar NP Geriatrics [facility] [address], [place] [address] On Call:  [phone] & follow prompts after 5pm & weekends Office Phone:  [phone] Office Fax:  [phone]

## 2015-07-16 ENCOUNTER — Ambulatory Visit: Payer: Medicare Other | Admitting: Pulmonary Disease

## 2015-07-16 DIAGNOSIS — E039 Hypothyroidism, unspecified: Secondary | ICD-10-CM | POA: Insufficient documentation

## 2015-07-16 LAB — BASIC METABOLIC PANEL
BUN: 17 mg/dL (ref 4–21)
CREATININE: 0.8 mg/dL (ref 0.6–1.3)
Potassium: 4 mmol/L (ref 3.4–5.3)
Sodium: 142 mmol/L (ref 137–147)

## 2015-07-16 LAB — CBC AND DIFFERENTIAL
HEMATOCRIT: 35 % — AB (ref 41–53)
Hemoglobin: 11.7 g/dL — AB (ref 13.5–17.5)
PLATELETS: 259 10*3/uL (ref 150–399)
WBC: 11.8 10*3/mL

## 2015-07-16 NOTE — Assessment & Plan Note (Signed)
Taking Synthroid daily. Update TSH

## 2015-07-16 NOTE — Assessment & Plan Note (Signed)
Transient Atrial fibrillation with RVR (HCC): New onset likely due to respiratory failure and tachypnea - Currently improved, continue home dose of Cardizem 120 mg daily - Not on anticoagulation, is not candidate for anticoagulation due to severe epistaxis - Hold aspirin due to bleeding

## 2015-07-16 NOTE — Assessment & Plan Note (Signed)
HYPERGLYCEMIA - Continue carb modified diet.

## 2015-07-16 NOTE — Assessment & Plan Note (Signed)
end stage COPD on 5 L oxygen per [place] at home. On chronic prednisone at 10mg /day

## 2015-07-16 NOTE — Assessment & Plan Note (Signed)
a massive right sided nosebleed. This is his second major nosebleed in 3 months (last time left nare). ENT consultation while in hospital.

## 2015-07-16 NOTE — Assessment & Plan Note (Addendum)
Currently stable, continue Cardizem 120mg, Lisinopril 40mg daily 

## 2015-07-17 ENCOUNTER — Other Ambulatory Visit: Payer: Self-pay | Admitting: Nurse Practitioner

## 2015-07-17 DIAGNOSIS — F411 Generalized anxiety disorder: Secondary | ICD-10-CM

## 2015-07-17 DIAGNOSIS — D539 Nutritional anemia, unspecified: Secondary | ICD-10-CM

## 2015-07-18 LAB — TSH: TSH: 12.59 u[IU]/mL — AB (ref 0.41–5.90)

## 2015-07-19 ENCOUNTER — Non-Acute Institutional Stay (SKILLED_NURSING_FACILITY): Payer: Medicare Other | Admitting: Internal Medicine

## 2015-07-19 ENCOUNTER — Encounter: Payer: Self-pay | Admitting: Internal Medicine

## 2015-07-19 DIAGNOSIS — E039 Hypothyroidism, unspecified: Secondary | ICD-10-CM

## 2015-07-19 DIAGNOSIS — R040 Epistaxis: Secondary | ICD-10-CM | POA: Diagnosis not present

## 2015-07-19 DIAGNOSIS — D539 Nutritional anemia, unspecified: Secondary | ICD-10-CM

## 2015-07-19 DIAGNOSIS — J9610 Chronic respiratory failure, unspecified whether with hypoxia or hypercapnia: Secondary | ICD-10-CM | POA: Diagnosis not present

## 2015-07-19 DIAGNOSIS — J439 Emphysema, unspecified: Secondary | ICD-10-CM | POA: Diagnosis not present

## 2015-07-19 DIAGNOSIS — E43 Unspecified severe protein-calorie malnutrition: Secondary | ICD-10-CM

## 2015-07-19 DIAGNOSIS — F4321 Adjustment disorder with depressed mood: Secondary | ICD-10-CM | POA: Diagnosis not present

## 2015-07-19 DIAGNOSIS — T380X5A Adverse effect of glucocorticoids and synthetic analogues, initial encounter: Secondary | ICD-10-CM

## 2015-07-19 DIAGNOSIS — I4891 Unspecified atrial fibrillation: Secondary | ICD-10-CM | POA: Diagnosis not present

## 2015-07-19 DIAGNOSIS — Z9981 Dependence on supplemental oxygen: Secondary | ICD-10-CM | POA: Diagnosis not present

## 2015-07-19 DIAGNOSIS — R634 Abnormal weight loss: Secondary | ICD-10-CM | POA: Diagnosis not present

## 2015-07-19 DIAGNOSIS — H4010X0 Unspecified open-angle glaucoma, stage unspecified: Secondary | ICD-10-CM | POA: Insufficient documentation

## 2015-07-19 DIAGNOSIS — I10 Essential (primary) hypertension: Secondary | ICD-10-CM

## 2015-07-19 DIAGNOSIS — R2681 Unsteadiness on feet: Secondary | ICD-10-CM

## 2015-07-19 DIAGNOSIS — R531 Weakness: Secondary | ICD-10-CM | POA: Diagnosis not present

## 2015-07-19 DIAGNOSIS — R739 Hyperglycemia, unspecified: Secondary | ICD-10-CM

## 2015-07-19 HISTORY — DX: Unsteadiness on feet: R26.81

## 2015-07-19 HISTORY — DX: Weakness: R53.1

## 2015-07-19 HISTORY — DX: Dependence on supplemental oxygen: Z99.81

## 2015-07-19 NOTE — Progress Notes (Signed)
Patient ID: Brendan Herring, male   DOB: [birth year], 74 y.o.   MRN: [id number]    HISTORY AND PHYSICAL  Location:  [facility] Room Number: 17 Place of Service: SNF (31)   Extended Emergency Contact Information Primary Emergency Contact: Plass,Nathan Address: [place], [place] Phone: [phone] Relation: Son  Advanced Directive information Does patient have an advance directive?: Yes, Type of Advance Directive: Out of facility DNR (pink MOST or yellow form);Healthcare Power of Attorney, Pre-existing out of facility DNR order (yellow form or pink MOST form): Yellow form placed in chart (order not valid for inpatient use), Does patient want to make changes to advanced directive?: No - Patient declined  Chief Complaint  Patient presents with  . New Admit To SNF    Following hospitalization    HPI:  Admit to SNF following hospitalization from 07/10/2015 through 07/13/2015 from massive right-sided nosebleed. This was the second nosebleed in the last 3 months. Patient feels weak since this happened.  Patient has generalized debility related to his severe emphysema. He was oxygen dependent prior to his hospitalizations. Dr. Gwenette Greet was his previous pulmonologist, but he is now seeing Dr. Garrel Ridgel. He is on Spiriva, Symbicort, and albuterol nebulizer and inhaler when needed for rescue.  He has a anxiety and grief reaction related to his wife's death in 1999/05/30. His wife had been declining gradually over the last 2 years and was nursing facilities during most times. Patient moved in to [facility] in 05/29/2013 and has been living independently since then.  Macrocytic anemia - likely related to nosebleed in July 2016 and again in November 2016.  Weak - I believe mostly related to his chronic lung disease there may be other factors. She will need rehabilitative efforts to begin walking safely again.  He has been using a four-wheel walker with brakes, but now needs somebody to walk along with him because he is so unstable standing.  Chronic respiratory failure, unspecified whether with hypoxia or hypercapnia (HCC) - chronic respiratory failure is related to his emphysema.  Pulmonary emphysema, unspecified emphysema type ([place]) - centrilobular. Oxygen dependent. Has been on as much as 5 L/m, but most recently seems to be able to tolerate 3 L/m.  Loss of weight - patient's weight has been reasonably stable up until about 5 months ago. He then gradually losing weight and this has accumulated to about 5 pounds. He says his appetite is good. He describes himself as a poor cook.  Protein-calorie malnutrition, severe ([place]) - related to weight loss and poor eating habits. He has been consuming food from the [employer] main dining area.  Hypothyroidism, unspecified hypothyroidism type - compensated  Atrial fibrillation with RVR (HCC) - rate controlled. He is not an anticoagulant candidate due to the nosebleeds. -   Essential hypertension - controlled   Steroid-induced hyperglycemia - follow with routine lab work .    Past Medical History  Diagnosis Date  . Emphysema   . Hypertension   . Coronary artery disease   . Thyroid disease   . Glaucoma   . Hypercholesterolemia   . COPD (chronic obstructive pulmonary disease) ([place])   . Weak 07/19/2015  . Unstable gait 07/19/2015  . Oxygen dependent 07/19/2015    Past Surgical History  Procedure Laterality Date  . Back surgery  1982  . Cataract extraction  1990  bilateral  . Tonsillectomy  1994  . Wisdom tooth extraction  1960  . Vasectomy  1970  . Coronary angioplasty with stent placement      Patient Care Team: Lona Kettle, MD as PCP - General (Family Medicine)  Social History   Social History  . Marital Status: Married    Spouse Name: N/A  . Number of Children: N/A  . Years of Education: N/A   Occupational History    . retired from [employer] Topics  . Smoking status: Former Smoker -- 1.00 packs/day for 35 years    Types: Cigarettes    Quit date: 08/17/1994  . Smokeless tobacco: Not on file  . Alcohol Use: No  . Drug Use: No  . Sexual Activity: Not on file   Other Topics Concern  . Not on file   Social History Narrative    reports that he quit smoking about 20 years ago. His smoking use included Cigarettes. He has a 35 pack-year smoking history. He does not have any smokeless tobacco history on file. He reports that he does not drink alcohol or use illicit drugs.  Family History  Problem Relation Age of Onset  . Emphysema Father   . Heart failure Father   . Stroke Father   . Heart disease Father   . Heart failure Brother   . Heart disease Mother    Family Status  Relation Status Death Age  . Father Deceased   . Brother Alive   . Mother Deceased     Immunization History  Administered Date(s) Administered  . Influenza Split 05/16/2012, 06/17/2013  . Influenza Whole 05/17/2009, 04/24/2010, 05/18/2011  . Influenza,inj,Quad PF,36+ Mos 04/20/2014  . Pneumococcal Polysaccharide-23 05/17/2009    Allergies  Allergen Reactions  . Benazepril Hcl     REACTION: sensitive to sunlight  . Levofloxacin     REACTION: tendonitis    Medications: Patient's Medications  New Prescriptions   No medications on file  Previous Medications   ACETAZOLAMIDE (DIAMOX) 250 MG TABLET    TAKE 1 TABLET IN THE MORNING.   ALBUTEROL (PROAIR HFA) 108 (90 BASE) MCG/ACT INHALER    Inhale 2 puffs into the lungs every 4 (four) hours as needed for shortness of breath.   ALBUTEROL (PROVENTIL) (2.5 MG/3ML) 0.083% NEBULIZER SOLUTION    Take 3 mLs (2.5 mg total) by nebulization 3 (three) times daily. DX J44.1   ALPHAGAN P 0.1 % SOLN    Place 1 drop into the left eye 2 (two) times daily.   AMOXICILLIN-CLAVULANATE (AUGMENTIN) 875-125 MG TABLET    Take 1 tablet by mouth 2 (two)  times daily. X 7days   BUDESONIDE-FORMOTEROL (SYMBICORT) 160-4.5 MCG/ACT INHALER    Inhale 2 puffs into the lungs 2 (two) times daily.   CALCIUM CARBONATE-VITAMIN D (CALCIUM 600-D) 600-400 MG-UNIT PER TABLET    Take 1 tablet by mouth daily.    CHOLECALCIFEROL (VITAMIN D) 1000 UNITS TABLET    Take 1,000 Units by mouth daily.   DILTIAZEM (CARDIZEM CD) 120 MG 24 HR CAPSULE    Take 120 mg by mouth daily.   LEVOTHYROXINE (SYNTHROID, LEVOTHROID) 112 MCG TABLET    Take 112 mcg by mouth daily.   LISINOPRIL (PRINIVIL,ZESTRIL) 20 MG TABLET    Take 40 mg by mouth daily.    LUMIGAN 0.01 % SOLN    Place 1 drop into both eyes at bedtime.   NON FORMULARY    Oxygen 3.5p-4 Liters daily.  PREDNISONE (DELTASONE) 10 MG TABLET    Once daily Please continue Prednisone 27m daily after taper is complete   PREDNISONE (DELTASONE) 10 MG TABLET    Prednisone dosing: Take  Prednisone 440m(4 tabs) x 3 days, then taper to 3071m3 tabs) x 3 days, then 18m88m tabs) x 3days, then continue 10mg15mly   PROBIOTIC PRODUCT (PROBIOTIC PO)    Take 1 tablet by mouth daily.   TIOTROPIUM BROMIDE MONOHYDRATE (SPIRIVA RESPIMAT) 2.5 MCG/ACT AERS    Take 1 puff by mouth daily.  Modified Medications   No medications on file  Discontinued Medications   No medications on file    Review of Systems  Constitutional: Negative for fever, activity change, appetite change, fatigue and unexpected weight change.       Frail  HENT: Positive for hearing loss (severe, bilateral) and nosebleeds. Negative for congestion, ear pain, rhinorrhea, sore throat, tinnitus, trouble swallowing and voice change.   Eyes: Negative for redness.       Corrective lenses. Bilateral open-angle glaucoma.  Respiratory: Positive for shortness of breath. Negative for cough, choking, chest tightness and wheezing.        Short of breath with talking. Oxygen dependent.  Cardiovascular: Negative for chest pain, palpitations and leg swelling.  Gastrointestinal: Negative  for nausea, vomiting, abdominal pain, diarrhea, constipation and abdominal distention.  Endocrine: Negative for cold intolerance, heat intolerance, polydipsia, polyphagia and polyuria.       Glucose was elevated to 120 mg percent when hospitalized and on prednisone. Compensated hypothyroidism  Genitourinary: Negative for dysuria, urgency, frequency and testicular pain.       Not incontinent  Musculoskeletal: Positive for gait problem. Negative for myalgias, back pain, arthralgias and neck pain.  Skin: Negative for color change, pallor and rash.  Allergic/Immunologic: Negative.   Neurological: Positive for weakness. Negative for dizziness, tremors, syncope, speech difficulty, numbness and headaches.  Hematological: Negative for adenopathy. Does not bruise/bleed easily.  Psychiatric/Behavioral: Negative for hallucinations, behavioral problems, confusion, sleep disturbance and decreased concentration. The patient is not nervous/anxious.     Filed Vitals:   07/19/15 1547  BP: 124/68  Pulse: 94  Temp: 99.4 F (37.4 C)  Resp: 20  Height: 6' 1"  (1.854 m)  Weight: 123 lb 8 oz (56.019 kg)  SpO2: 97%   Body mass index is 16.3 kg/(m^2).  Physical Exam  Constitutional: He is oriented to person, place, and time. He appears well-nourished. No distress.  Thin. Generalized weakness.  HENT:  Head: Normocephalic and atraumatic.  Right Ear: External ear normal.  Left Ear: External ear normal.  Nose: Nose normal.  Mouth/Throat: Oropharynx is clear and moist.  Bilateral severe hearing loss.  Eyes: Conjunctivae and EOM are normal. Pupils are equal, round, and reactive to light. Right eye exhibits no discharge. Left eye exhibits no discharge. No scleral icterus.  Neck: Normal range of motion. Neck supple. No JVD present. No tracheal deviation present. No thyromegaly present.  Cardiovascular: Normal rate, regular rhythm and normal heart sounds.  Exam reveals no gallop and no friction rub.   No murmur  heard. Pulmonary/Chest: Effort normal. No stridor. No respiratory distress. He has no wheezes. He has no rales. He exhibits no tenderness.  Decreased breath sound bilaterally.   Abdominal: Soft. Bowel sounds are normal. He exhibits no distension and no mass. There is no tenderness. There is no rebound and no guarding. No hernia.  Musculoskeletal: Normal range of motion. He exhibits no edema or tenderness.  Significant generalized weakness. Uses  four-wheel walker with assistance.  Lymphadenopathy:    He has no cervical adenopathy.  Neurological: He is alert and oriented to person, place, and time. He has normal reflexes. He displays normal reflexes. No cranial nerve deficit. He exhibits normal muscle tone. Coordination normal.  Skin: Skin is warm and dry. No rash noted. He is not diaphoretic. No erythema. No pallor.  Psychiatric: His behavior is normal. Thought content normal.  Mild to moderate anxiety.    Labs reviewed: Lab Summary Latest Ref Rng 07/16/2015 07/12/2015 07/11/2015 07/10/2015 07/10/2015 07/10/2015  Hemoglobin 13.5 - 17.5 g/dL 11.7(A) 11.2(L) 11.8(L) 12.6(L) 12.4(L) 11.8(L)  Hematocrit 41 - 53 % 35(A) 36.4(L) 37.6(L) 40.0 39.6 37.7(L)  White count - 11.8 18.5(H) 9.7 16.1(H) 15.3(H) 12.2(H)  Platelet count 150 - 399 K/L 259 298 291 366 356 313  Sodium 137 - 147 mmol/L 142 142 145 (None) (None) 144  Potassium 3.4 - 5.3 mmol/L 4.0 3.5 3.8 (None) (None) 3.5  Calcium 8.9 - 10.3 mg/dL (None) 9.3 9.4 (None) (None) 9.3  Phosphorus - (None) (None) (None) (None) (None) (None)  Creatinine 0.6 - 1.3 mg/dL 0.8 0.88 0.88 (None) (None) 0.80  AST 15 - 41 U/L (None) (None) (None) (None) (None) 15  Alk Phos 38 - 126 U/L (None) (None) (None) (None) (None) 62  Bilirubin 0.3 - 1.2 mg/dL (None) (None) (None) (None) (None) 0.5  Glucose 65 - 99 mg/dL (None) 233(H) 226(H) (None) (None) 181(H)  Cholesterol - (None) (None) (None) (None) (None) (None)  HDL cholesterol - (None) (None) (None) (None)  (None) (None)  Triglycerides - (None) (None) (None) (None) (None) (None)  LDL Direct - (None) (None) (None) (None) (None) (None)  LDL Calc - (None) (None) (None) (None) (None) (None)  Total protein 6.5 - 8.1 g/dL (None) (None) (None) (None) (None) 6.1(L)  Albumin 3.5 - 5.0 g/dL (None) (None) (None) (None) (None) 3.1(L)   Lab Results  Component Value Date   BUN 17 07/16/2015   Lab Results  Component Value Date   HGBA1C 6.4* 07/12/2015   Lab Results  Component Value Date   TSH 1.302 02/25/2015   T4TOTAL 10.5 10/25/2009     Dg Chest Port 1 View  07/10/2015  CLINICAL DATA:  Shortness of breath. COPD. Acute respiratory failure with hypoxemia. EXAM: PORTABLE CHEST 1 VIEW COMPARISON:  03/08/2015 FINDINGS: Heart size is normal. Severe pulmonary hyperinflation again seen, consistent with COPD. No evidence of pulmonary infiltrate or pleural effusion. Probable skin fold seen overlying peripheral right upper lung field. IMPRESSION: Severe COPD.  No acute findings. Electronically Signed   By: Earle Gell M.D.   On: 07/10/2015 07:54     Assessment/Plan  1. Epistaxis Patient is at some continued risk for recurrent nosebleeds because of the nasal prongs she uses her oxygen delivery. There've been no further bleeding since he left the hospital. He has a clot in the right nostril. -Saline nasal spray as needed to relieve clot.  2. Macrocytic anemia Follow-up CBC  3. Weak Engaged in physical therapy  4. Chronic respiratory failure, unspecified whether with hypoxia or hypercapnia (HCC) Continue current medications and oxygen  5. Pulmonary emphysema, unspecified emphysema type ([place]) Cause of his respiratory problems  6. Oxygen dependent O2 at 3 L/m  7. Unstable gait Continue physical therapy for strengthening and safety mobility  8. Grief Continue current medications  9. Loss of weight Continue to monitor for further weight loss   10. Protein-calorie malnutrition, severe  ([place]) Monitor weight. Dietary supplements if needed.  11. Hypothyroidism, unspecified  hypothyroidism type Compensated  12. Atrial fibrillation with RVR (HCC) Monitor  13. Essential hypertension Controlled  14. Steroid-induced hyperglycemia -A1c, BMP

## 2015-07-22 ENCOUNTER — Other Ambulatory Visit: Payer: Self-pay | Admitting: Nurse Practitioner

## 2015-07-22 DIAGNOSIS — E039 Hypothyroidism, unspecified: Secondary | ICD-10-CM

## 2015-07-23 LAB — CBC AND DIFFERENTIAL
HEMATOCRIT: 36 % — AB (ref 41–53)
HEMOGLOBIN: 11.7 g/dL — AB (ref 13.5–17.5)
Platelets: 365 10*3/uL (ref 150–399)
WBC: 14.3 10^3/mL

## 2015-07-23 LAB — BASIC METABOLIC PANEL
BUN: 20 mg/dL (ref 4–21)
Creatinine: 1 mg/dL (ref 0.6–1.3)
GLUCOSE: 169 mg/dL
Potassium: 3.6 mmol/L (ref 3.4–5.3)
SODIUM: 143 mmol/L (ref 137–147)

## 2015-07-23 LAB — HEMOGLOBIN A1C: Hgb A1c MFr Bld: 6.3 % — AB (ref 4.0–6.0)

## 2015-07-24 ENCOUNTER — Encounter: Payer: Self-pay | Admitting: Nurse Practitioner

## 2015-07-24 ENCOUNTER — Non-Acute Institutional Stay (SKILLED_NURSING_FACILITY): Payer: Medicare Other | Admitting: Nurse Practitioner

## 2015-07-24 DIAGNOSIS — R627 Adult failure to thrive: Secondary | ICD-10-CM | POA: Insufficient documentation

## 2015-07-24 DIAGNOSIS — I10 Essential (primary) hypertension: Secondary | ICD-10-CM | POA: Diagnosis not present

## 2015-07-24 DIAGNOSIS — D539 Nutritional anemia, unspecified: Secondary | ICD-10-CM

## 2015-07-24 DIAGNOSIS — E039 Hypothyroidism, unspecified: Secondary | ICD-10-CM | POA: Diagnosis not present

## 2015-07-24 DIAGNOSIS — F411 Generalized anxiety disorder: Secondary | ICD-10-CM | POA: Diagnosis not present

## 2015-07-24 DIAGNOSIS — J439 Emphysema, unspecified: Secondary | ICD-10-CM | POA: Diagnosis not present

## 2015-07-24 DIAGNOSIS — I4891 Unspecified atrial fibrillation: Secondary | ICD-10-CM | POA: Diagnosis not present

## 2015-07-24 NOTE — Assessment & Plan Note (Signed)
07/22/15 TSH 12.586, increase Synthroid to 150mcg daily. Update TSH 8 weeks.

## 2015-07-24 NOTE — Assessment & Plan Note (Signed)
end stage COPD on 5 L oxygen per [place] at home. On chronic prednisone at 10mg /day, Hospice referral, may consider Morphine prn

## 2015-07-24 NOTE — Assessment & Plan Note (Signed)
Stable, Hgb 11.7 07/23/15

## 2015-07-24 NOTE — Assessment & Plan Note (Signed)
Transient Atrial fibrillation with RVR (HCC): New onset likely due to respiratory failure and tachypnea when in hospital.  - Currently improved, continue home dose of Cardizem 120 mg daily - Not on anticoagulation, is not candidate for anticoagulation due to severe epistaxis, off ASA too  

## 2015-07-24 NOTE — Progress Notes (Signed)
Patient ID: Brendan Herring, male   DOB: 1941/03/31, 74 y.o.   MRN: [id number]  Location:  SNF [facility] Provider:  Chipper Oman NP  Code Status:  DNR Goals of care: Advanced Directive information    Chief Complaint  Patient presents with  . Medical Management of Chronic Issues  . Acute Visit    using all my strength to take a breath.      HPI: Patient is a 74 y.o. male seen in the SNF at [facility] today for evaluation of fatigue, SOB, desires comfort measures.    hospitalization 07/10/15-07/13/15 for a massive right sided nosebleed. This is his second major nosebleed in 3 months (last time left nare). end stage of COPD exacerbation. Completed Prednisone taper dose and 7 day course Augmentin bid at SNF.   Review of Systems  Constitutional: Negative for fever.  HENT: Negative for congestion, ear pain, nosebleeds and sore throat.   Eyes: Negative for redness.  Respiratory: Positive for shortness of breath. Negative for wheezing.   Cardiovascular: Negative for palpitations and leg swelling.  Gastrointestinal: Negative for nausea and vomiting.  Genitourinary: Negative for dysuria.  Skin: Negative for rash.  Neurological: Negative for headaches.  Endo/Heme/Allergies: Does not bruise/bleed easily.  Psychiatric/Behavioral: The patient is not nervous/anxious.     Past Medical History  Diagnosis Date  . Emphysema   . Hypertension   . Coronary artery disease   . Thyroid disease   . Glaucoma   . Hypercholesterolemia   . COPD (chronic obstructive pulmonary disease) (HCC)   . Weak 07/19/2015  . Unstable gait 07/19/2015  . Oxygen dependent 07/19/2015    Patient Active Problem List   Diagnosis Date Noted  . Failure to thrive in adult 07/24/2015  . Glaucoma, open angle 07/19/2015  . Weak 07/19/2015  . Unstable gait 07/19/2015  . 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 Grief 07/19/2015  . Loss of weight 07/19/2015  . Generalized anxiety disorder 07/17/2015  . Hypothyroidism 07/16/2015    . Epistaxis 07/10/2015  . Atrial fibrillation with RVR (HCC) 07/10/2015  . Degenerative myopia 05/17/2015  . Protein-calorie malnutrition, severe (HCC) 02/27/2015  . Macrocytic anemia 02/26/2015  . Palliative care encounter 02/26/2015  . Shortness of breath 02/26/2015  . Steroid-induced hyperglycemia 02/25/2015  . Essential hypertension 12/27/2009  . Chronic respiratory failure (HCC) 12/27/2009  . 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 11/27/2009    Allergies  Allergen Reactions  . Benazepril Hcl     REACTION: sensitive to sunlight  . Levofloxacin     REACTION: tendonitis    Medications: Patient's Medications  New Prescriptions   No medications on file  Previous Medications   ACETAZOLAMIDE (DIAMOX) 250 MG TABLET    TAKE 1 TABLET IN THE MORNING.   ALBUTEROL (PROAIR HFA) 108 (90 BASE) MCG/ACT INHALER    Inhale 2 puffs into the lungs every 4 (four) hours as needed for shortness of breath.   ALBUTEROL (PROVENTIL) (2.5 MG/3ML) 0.083% NEBULIZER SOLUTION    Take 3 mLs (2.5 mg total) by nebulization 3 (three) times daily. DX J44.1   ALPHAGAN P 0.1 % SOLN    Place 1 drop into the left eye 2 (two) times daily.   AMOXICILLIN-CLAVULANATE (AUGMENTIN) 875-125 MG TABLET    Take 1 tablet by mouth 2 (two) times daily. X 7days   BUDESONIDE-FORMOTEROL (SYMBICORT) 160-4.5 MCG/ACT INHALER    Inhale 2 puffs into the lungs 2 (two) times daily.   CALCIUM CARBONATE-VITAMIN D (CALCIUM 600-D) 600-400 MG-UNIT  PER TABLET    Take 1 tablet by mouth daily.    CHOLECALCIFEROL (VITAMIN D) 1000 UNITS TABLET    Take 1,000 Units by mouth daily.   DILTIAZEM (CARDIZEM CD) 120 MG 24 HR CAPSULE    Take 120 mg by mouth daily.   LEVOTHYROXINE (SYNTHROID, LEVOTHROID) 112 MCG TABLET    Take 112 mcg by mouth daily.   LISINOPRIL (PRINIVIL,ZESTRIL) 20 MG TABLET    Take 40 mg by mouth daily.    LUMIGAN 0.01 % SOLN    Place 1 drop into both eyes at bedtime.   NON FORMULARY    Oxygen 3.5p-4  Liters daily.   PREDNISONE (DELTASONE) 10 MG TABLET    Once daily Please continue Prednisone 10mg  daily after taper is complete   PREDNISONE (DELTASONE) 10 MG TABLET    Prednisone dosing: Take  Prednisone 40mg  (4 tabs) x 3 days, then taper to 30mg  (3 tabs) x 3 days, then 20mg  (2 tabs) x 3days, then continue 10mg  daily   PROBIOTIC PRODUCT (PROBIOTIC PO)    Take 1 tablet by mouth daily.   TIOTROPIUM BROMIDE MONOHYDRATE (SPIRIVA RESPIMAT) 2.5 MCG/ACT AERS    Take 1 puff by mouth daily.  Modified Medications   No medications on file  Discontinued Medications   No medications on file    Physical Exam: Filed Vitals:   07/24/15 1439  BP: 104/78  Pulse: 96  Temp: 98.7 F (37.1 C)  TempSrc: Tympanic  Resp: 20   There is no weight on file to calculate BMI.  Physical Exam  Constitutional: He is oriented to person, place, and time. He appears well-developed and well-nourished. No distress.  HENT:  Head: Normocephalic and atraumatic.  Right Ear: External ear normal.  Left Ear: External ear normal.  Nose: Nose normal.  Mouth/Throat: Oropharynx is clear and moist.  Eyes: Conjunctivae and EOM are normal. Pupils are equal, round, and reactive to light. Right eye exhibits no discharge. Left eye exhibits no discharge. No scleral icterus.  Neck: Normal range of motion. Neck supple. No JVD present. No tracheal deviation present. No thyromegaly present.  Cardiovascular: Normal rate, regular rhythm and normal heart sounds.  Exam reveals no gallop and no friction rub.   No murmur heard. Pulmonary/Chest: Effort normal. No stridor. No respiratory distress. He has no wheezes. He has no rales. He exhibits no tenderness.  Decreased breath sound bilaterally.   Abdominal: Soft. Bowel sounds are normal. He exhibits no distension and no mass. There is no tenderness. There is no rebound and no guarding. No hernia.  Musculoskeletal: Normal range of motion. He exhibits no edema or tenderness.  Lymphadenopathy:      He has no cervical adenopathy.  Neurological: He is alert and oriented to person, place, and time. He has normal reflexes. He displays normal reflexes. No cranial nerve deficit. He exhibits normal muscle tone. Coordination normal.  Skin: Skin is warm and dry. No rash noted. He is not diaphoretic. No erythema. No pallor.  Psychiatric: He has a normal mood and affect. His behavior is normal. Thought content normal.    Labs reviewed: Basic Metabolic Panel:  Recent Labs  16/05/9610/23/16 0647 07/11/15 0207 07/12/15 0415 07/16/15 07/23/15  NA 144 145 142 142 143  K 3.5 3.8 3.5 4.0 3.6  CL 102 102 101  --   --   CO2 37* 35* 35*  --   --   GLUCOSE 181* 226* 233*  --   --   BUN 16 24* 32* 17 20  CREATININE 0.80 0.88 0.88 0.8 1.0  CALCIUM 9.3 9.4 9.3  --   --     Liver Function Tests:  Recent Labs  02/26/15 0320 04/10/15 1228 07/10/15 0647  AST ALT 20 15 14*  ALKPHOS 57 48 62  BILITOT 0.2* 0.4 0.5  PROT 6.2* 6.8 6.1*  ALBUMIN 2.9* 4.2 3.1*    CBC:  Recent Labs  02/25/15 1653  07/10/15 0647 07/10/15 1141 07/10/15 1355 07/11/15 0207 07/12/15 0415 07/16/15 07/23/15  WBC 8.8  < > 12.2* 15.3* 16.1* 9.7 18.5* 11.8 14.3  NEUTROABS 6.0  --  8.0* 14.4*  --   --   --   --   --   HGB 13.1  < > 11.8* 12.4* 12.6* 11.8* 11.2* 11.7* 11.7*  HCT 41.4  < > 37.7* 39.6 40.0 37.6* 36.4* 35* 36*  MCV 104.3*  < > 107.4* 107.3* 106.7* 105.9* 107.1*  --   --   PLT 224  < > 313 356 366 291 298 259 365  < > = values in this interval not displayed.  Lab Results  Component Value Date   TSH 12.59* 07/18/2015   Lab Results  Component Value Date   HGBA1C 6.3* 07/23/2015   No results found for: CHOL, HDL, LDLCALC, LDLDIRECT, TRIG, CHOLHDL  Significant Diagnostic Results since last visit: none  Patient Care Team: Gildardo Cranker, MD as PCP - General (Family Medicine)  Assessment/Plan Problem List Items Addressed This Visit    Essential hypertension - Primary (Chronic)    Currently  stable, continue Cardizem , Lisinopril  daily.       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 On chronic prednisone at /day, Hospice referral, may consider Morphine prn      Macrocytic anemia (Chronic)    Stable, Hgb 11.7 07/23/15      Atrial fibrillation with RVR (HCC) (Chronic)    Transient Atrial fibrillation with RVR (HCC): New onset likely due to respiratory failure and tachypnea when in hospital.  - Currently improved, continue home dose of Cardizem 120 mg daily - Not on anticoagulation, is not candidate for anticoagulation due to severe epistaxis, off ASA too      Hypothyroidism (Chronic)    07/22/15 TSH 12.586, increase Synthroid to daily. Update TSH 8 weeks.       Generalized anxiety disorder (Chronic)    07/17/15 Lorazepam 0.5mg  bid, respiratory distress, Prednisone tapering all contributory      Failure to thrive in adult    Multiple factorials, mainly end stage COPD contributory, Hospice referral, comfort measures, prn Lorazepam and Morphine available to him.           Family/ staff Communication: goal is comfort measures, Hospice referral.   Labs/tests ordered: none  [facility] Shamell Suarez NP Geriatrics [facility] [address] [place], [place] [address] On Call:  [phone] & follow prompts after 5pm & weekends Office Phone:  [phone] Office Fax:  [phone]

## 2015-07-24 NOTE — Assessment & Plan Note (Signed)
Multiple factorials, mainly end stage COPD contributory, Hospice referral, comfort measures, prn Lorazepam and Morphine available to him.

## 2015-07-24 NOTE — Assessment & Plan Note (Signed)
07/17/15 Lorazepam 0.5mg bid, respiratory distress, Prednisone tapering all contributory 

## 2015-07-24 NOTE — Assessment & Plan Note (Signed)
Currently stable, continue Cardizem 120mg, Lisinopril 40mg daily 

## 2015-08-15 ENCOUNTER — Non-Acute Institutional Stay: Payer: Medicare Other | Admitting: Nurse Practitioner

## 2015-08-15 ENCOUNTER — Encounter: Payer: Self-pay | Admitting: Nurse Practitioner

## 2015-08-15 VITALS — BP 144/82 | HR 97 | Temp 97.8°F | Ht 73.0 in | Wt 126.8 lb

## 2015-08-15 DIAGNOSIS — R627 Adult failure to thrive: Secondary | ICD-10-CM | POA: Diagnosis not present

## 2015-08-15 DIAGNOSIS — I10 Essential (primary) hypertension: Secondary | ICD-10-CM | POA: Diagnosis not present

## 2015-08-15 DIAGNOSIS — F411 Generalized anxiety disorder: Secondary | ICD-10-CM | POA: Diagnosis not present

## 2015-08-15 DIAGNOSIS — J439 Emphysema, unspecified: Secondary | ICD-10-CM

## 2015-08-15 DIAGNOSIS — D539 Nutritional anemia, unspecified: Secondary | ICD-10-CM

## 2015-08-15 DIAGNOSIS — E039 Hypothyroidism, unspecified: Secondary | ICD-10-CM

## 2015-08-15 DIAGNOSIS — R739 Hyperglycemia, unspecified: Secondary | ICD-10-CM

## 2015-08-15 DIAGNOSIS — I4891 Unspecified atrial fibrillation: Secondary | ICD-10-CM | POA: Diagnosis not present

## 2015-08-15 DIAGNOSIS — T380X5A Adverse effect of glucocorticoids and synthetic analogues, initial encounter: Secondary | ICD-10-CM

## 2015-08-15 NOTE — Assessment & Plan Note (Signed)
Last Hgb A1c 6.3 07/23/15, will monitor fasting CBG x1/month.

## 2015-08-15 NOTE — Assessment & Plan Note (Signed)
Currently stable, continue Cardizem 120mg , Lisinopril 40mg  daily

## 2015-08-15 NOTE — Assessment & Plan Note (Signed)
Stable, Hgb 11.7 07/23/15 

## 2015-08-15 NOTE — Assessment & Plan Note (Signed)
07/22/15 TSH 12.586, increase Synthroid to 150mcg daily. Update TSH 8 weeks.  

## 2015-08-15 NOTE — Progress Notes (Signed)
Patient ID: Brendan Herring, male   DOB: [birth year], 74 y.o.   MRN: [id number]  Location:  clinic [facility] Provider:  Chipper Oman NP  Code Status:  DNR Goals of care: Advanced Directive information    Chief Complaint  Patient presents with  . Acute Visit    Refusing to do blood sugar count. He is refusing to do anything until he sees the Dr.  He wants to know what dosage of Prednisone he is getting.      HPI: Patient is a 74 y.o. male seen in the clinic at [facility] today for evaluation of fatigue, SOB, blood sugar, HTN, Afib, FTT desires comfort measures(don't desire to be checked from 9pm to 7 am, grapefruit juice x 1/wk, only check CBG once a month)   hospitalization 07/10/15-07/13/15 for a massive right sided nosebleed. This is his second major nosebleed in 3 months (last time left nare). end stage of COPD exacerbation. Completed Prednisone taper dose and 7 day course Augmentin bid at SNF.   Review of Systems  Constitutional: Negative for fever.  HENT: Negative for congestion, ear pain, nosebleeds and sore throat.   Eyes: Negative for redness.  Respiratory: Positive for shortness of breath. Negative for wheezing.   Cardiovascular: Negative for palpitations and leg swelling.  Gastrointestinal: Negative for nausea and vomiting.  Genitourinary: Negative for dysuria.  Skin: Negative for rash.  Neurological: Negative for headaches.  Endo/Heme/Allergies: Does not bruise/bleed easily.  Psychiatric/Behavioral: The patient is not nervous/anxious.     Past Medical History  Diagnosis Date  . Emphysema   . Hypertension   . Coronary artery disease   . Thyroid disease   . Glaucoma   . Hypercholesterolemia   . COPD (chronic obstructive pulmonary disease) (HCC)   . Weak 07/19/2015  . Unstable gait 07/19/2015  . Oxygen dependent 07/19/2015    Patient Active Problem List   Diagnosis Date Noted  . Failure to thrive in adult 07/24/2015  . Glaucoma, open angle 07/19/2015  . Weak  07/19/2015  . Unstable gait 07/19/2015  . 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 Grief 07/19/2015  . Loss of weight 07/19/2015  . Generalized anxiety disorder 07/17/2015  . Hypothyroidism 07/16/2015  . Epistaxis 07/10/2015  . Atrial fibrillation with RVR (HCC) 07/10/2015  . Degenerative myopia 05/17/2015  . Protein-calorie malnutrition, severe (HCC) 02/27/2015  . Macrocytic anemia 02/26/2015  . Palliative care encounter 02/26/2015  . Shortness of breath 02/26/2015  . Steroid-induced hyperglycemia 02/25/2015  . Essential hypertension 12/27/2009  . Chronic respiratory failure (HCC) 12/27/2009  . 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 11/27/2009    Allergies  Allergen Reactions  . Benazepril Other (See Comments)    sensitive to sun light  . Benazepril Hcl     REACTION: sensitive to sunlight  . Levofloxacin     REACTION: tendonitis    Medications: Patient's Medications  New Prescriptions   No medications on file  Previous Medications   ACETAZOLAMIDE (DIAMOX) 250 MG TABLET    TAKE 1 TABLET IN THE MORNING.   ALBUTEROL (PROAIR HFA) 108 (90 BASE) MCG/ACT INHALER    Inhale 2 puffs into the lungs every 4 (four) hours as needed for shortness of breath.   ALBUTEROL (PROVENTIL) (2.5 MG/3ML) 0.083% NEBULIZER SOLUTION    Take 3 mLs (2.5 mg total) by nebulization 3 (three) times daily. DX J44.1   ALPHAGAN P 0.1 % SOLN    Place 1 drop into the left eye 2 (two) times daily.  BUDESONIDE-FORMOTEROL (SYMBICORT) 160-4.5 MCG/ACT INHALER    Inhale 2 puffs into the lungs 2 (two) times daily.   CALCIUM CARBONATE-VITAMIN D (CALCIUM 600-D) 600-400 MG-UNIT PER TABLET    Take 1 tablet by mouth daily.    CHOLECALCIFEROL (VITAMIN D) 1000 UNITS TABLET    Take 1,000 Units by mouth daily.   DILTIAZEM (CARDIZEM CD) 120 MG 24 HR CAPSULE    Take 120 mg by mouth daily.   LACTOSE FREE NUTRITION (BOOST) LIQD    Take 237 mLs by mouth 3 (three) times daily between meals.   LATANOPROST  (XALATAN) 0.005 % OPHTHALMIC SOLUTION    Place 1 drop into both eyes at bedtime.   LEVOTHYROXINE (SYNTHROID, LEVOTHROID) 150 MCG TABLET    Take 150 mcg by mouth daily before breakfast.   LISINOPRIL (PRINIVIL,ZESTRIL) 40 MG TABLET    Take 40 mg by mouth daily.   LORAZEPAM (ATIVAN) 0.5 MG TABLET    Take one tablet by mouth twice daily   MIRTAZAPINE (REMERON) 7.5 MG TABLET    Take 7.5 mg by mouth at bedtime.   MORPHINE (ROXANOL) 20 MG/ML CONCENTRATED SOLUTION    Take  by mouth every 4 hours as needed for pain   NON FORMULARY    Oxygen 3.5p-4 Liters daily.   POLYETHYLENE GLYCOL (MIRALAX / GLYCOLAX) PACKET    Take 17 g by mouth daily as needed.   PREDNISONE (DELTASONE) 10 MG TABLET    Take one tablet by mouth alternating with 5 mg daily   PROBIOTIC PRODUCT (PROBIOTIC PO)    Take 1 tablet by mouth daily.   SACCHAROMYCES BOULARDII (FLORASTOR) 250 MG CAPSULE    Take one tablet by mouth once daily   SALINE NASAL SPRAY NA    Place into the nose. 2-3 sprays each nostril three times daily   TIOTROPIUM BROMIDE MONOHYDRATE (SPIRIVA RESPIMAT) 2.5 MCG/ACT AERS    Take 1 puff by mouth daily.  Modified Medications   No medications on file  Discontinued Medications   AMOXICILLIN-CLAVULANATE (AUGMENTIN) 875-125 MG TABLET    Take 1 tablet by mouth 2 (two) times daily. X 7days   LEVOTHYROXINE (SYNTHROID, LEVOTHROID) 112 MCG TABLET    Take 112 mcg by mouth daily.   LISINOPRIL (PRINIVIL,ZESTRIL) 20 MG TABLET    Take 40 mg by mouth daily.    LUMIGAN 0.01 % SOLN    Place 1 drop into both eyes at bedtime.   PREDNISONE (DELTASONE) 10 MG TABLET    Once daily Please continue Prednisone  daily after taper is complete   PREDNISONE (DELTASONE) 10 MG TABLET    Prednisone dosing: Take  Prednisone  (4 tabs) x 3 days, then taper to  (3 tabs) x 3 days, then  (2 tabs) x 3days, then continue  daily    Physical Exam: Filed Vitals:   08/15/15 1443  BP: 144/82  Pulse: 97  Temp: 97.8 F (36.6 C)  TempSrc:  Oral  Height:  (1.854 m)  Weight: 126 lb 12.8 oz (57.516 kg)  SpO2: 93%   Body mass index is 16.73 kg/(m^2).  Physical Exam  Constitutional: He is oriented to person, place, and time. He appears well-developed and well-nourished. No distress.  HENT:  Head: Normocephalic and atraumatic.  Right Ear: External ear normal.  Left Ear: External ear normal.  Nose: Nose normal.  Mouth/Throat: Oropharynx is clear and moist.  Eyes: Conjunctivae and EOM are normal. Pupils are equal, round, and reactive to light. Right eye exhibits no discharge. Left eye exhibits no  discharge. No scleral icterus.  Neck: Normal range of motion. Neck supple. No JVD present. No tracheal deviation present. No thyromegaly present.  Cardiovascular: Normal rate, regular rhythm and normal heart sounds.  Exam reveals no gallop and no friction rub.   No murmur heard. Pulmonary/Chest: No stridor. No respiratory distress. He has no wheezes. He has no rales. He exhibits no tenderness.  Decreased breath sound bilaterally. Labored breathing.   Abdominal: Soft. Bowel sounds are normal. He exhibits no distension and no mass. There is no tenderness. There is no rebound and no guarding. No hernia.  Musculoskeletal: Normal range of motion. He exhibits no edema or tenderness.  Lymphadenopathy:    He has no cervical adenopathy.  Neurological: He is alert and oriented to person, place, and time. He has normal reflexes. He displays normal reflexes. No cranial nerve deficit. He exhibits normal muscle tone. Coordination normal.  Skin: Skin is warm and dry. No rash noted. He is not diaphoretic. No erythema. No pallor.  Psychiatric: He has a normal mood and affect. His behavior is normal. Thought content normal.    Labs reviewed: Basic Metabolic Panel:  Recent Labs  07/10/15 0647 07/11/15 0207 07/12/15 0415 07/16/15 07/23/15  NA 144 145 142 142 143  K 3.5 3.8 3.5 4.0 3.6  CL 102 102 101  --   --   CO2 37* 35* 35*  --   --     GLUCOSE 181* 226* 233*  --   --   BUN 16 24* 32* 17 20  CREATININE 0.80 0.88 0.88 0.8 1.0  CALCIUM 9.3 9.4 9.3  --   --     Liver Function Tests:  Recent Labs  02/26/15 0320 04/10/15 1228 07/10/15 0647  AST 19 14 15   ALT 20 15 14*  ALKPHOS 57 48 62  BILITOT 0.2* 0.4 0.5  PROT 6.2* 6.8 6.1*  ALBUMIN 2.9* 4.2 3.1*    CBC:  Recent Labs  02/25/15 1653  07/10/15 0647 07/10/15 1141 07/10/15 1355 07/11/15 0207 07/12/15 0415 07/16/15 07/23/15  WBC 8.8  < > 12.2* 15.3* 16.1* 9.7 18.5* 11.8 14.3  NEUTROABS 6.0  --  8.0* 14.4*  --   --   --   --   --   HGB 13.1  < > 11.8* 12.4* 12.6* 11.8* 11.2* 11.7* 11.7*  HCT 41.4  < > 37.7* 39.6 40.0 37.6* 36.4* 35* 36*  MCV 104.3*  < > 107.4* 107.3* 106.7* 105.9* 107.1*  --   --   PLT 224  < > 313 356 366 291 298 259 365  < > = values in this interval not displayed.  Lab Results  Component Value Date   TSH 12.59* 07/18/2015   Lab Results  Component Value Date   HGBA1C 6.3* 07/23/2015   No results found for: CHOL, HDL, LDLCALC, LDLDIRECT, TRIG, CHOLHDL  Significant Diagnostic Results since last visit: none  Patient Care Team: [name], MD as PCP - General (Family Medicine)  Assessment/Plan Problem List Items Addressed This Visit    Steroid-induced hyperglycemia (Chronic)    Last Hgb A1c 6.3 07/23/15, will monitor fasting CBG x1/month.       Macrocytic anemia (Chronic)    Stable, Hgb 11.7 07/23/15      Hypothyroidism (Chronic)    07/22/15 TSH 12.586, increase Synthroid to <MEASUREPresenter, [id number] SuziHerSurgicare Surgical Associates Of JerCori44nTrix<MEASUREMEPresenter, broadcasHan60(437Dana SuziHerBron[facility] SuCori85nTrix<MEASUREMEPresenter, [id number] SuziHerY[facility] RehabilitatCori68nTrix<MEASUREMEPresenter, [id number] SuziHerAd[facility] AdventCori57nTrix<MEASUREMEPresenter, [id number] SuziHerWest [facility] SuCori31nTrix<MEASUREMEPresenter, broadcasHan54(954Dana SuziHerGate[facility] HCori12nTrix<MEASUREMEPresenter, broadcasHan25(289Dana SuziHerNovamed Surgery Center Of ChaCori26nTrix<MEASUREMEPresenter, [id number] SuziHerHigh Point TreaCori50nTrix<MEASUREMEPresenter, [id number] SuziHerB[facility] Emergency MeCori72nTrixie Dredge Mod[place]ate TSH 8 weeks.       Relevant Medications   levothyroxine (SYNTHROID, LEVOTHROID) 150 MCG tablet   Generalized anxiety disorder (Chronic)    07/17/15  Lorazepam 0.5mg  bid, respiratory distress, Prednisone tapering all contributory      Failure to thrive in adult    Multiple factorials, mainly end stage COPD contributory, comfort measures, prn Lorazepam and Morphine available to him.       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 - Primary (Chronic)    end stage COPD on 5 L oxygen per [place] at home. On chronic prednisone, will change to  and /day alternating dose per the patient's request. Morphine prn      Relevant Medications   SALINE NASAL SPRAY NA   predniSONE (DELTASONE) 10 MG tablet   Atrial fibrillation with RVR (HCC) (Chronic)    Transient Atrial fibrillation with RVR (HCC): New onset likely due to respiratory failure and tachypnea when in hospital.  - Currently improved, continue home dose of Cardizem 120 mg daily - Not on anticoagulation, is not candidate for anticoagulation due to severe epistaxis, off ASA too      Relevant Medications   lisinopril (PRINIVIL,ZESTRIL) 40 MG tablet       Family/ staff Communication: goal is comfort measures  Labs/tests ordered: none  [facility] Mishael Haran NP Geriatrics [facility] [address], [place] [address] On Call:  [phone] & follow prompts after 5pm & weekends Office Phone:  [phone] Office Fax:  [phone]

## 2015-08-15 NOTE — Assessment & Plan Note (Signed)
07/17/15 Lorazepam 0.5mg  bid, respiratory distress, Prednisone tapering all contributory

## 2015-08-15 NOTE — Assessment & Plan Note (Signed)
end stage COPD on 5 L oxygen per [place] at home. On chronic prednisone, will change to 5mg  and 10mg /day alternating dose per the patient's request. Morphine prn

## 2015-08-15 NOTE — Assessment & Plan Note (Signed)
Transient Atrial fibrillation with RVR (HCC): New onset likely due to respiratory failure and tachypnea when in hospital.  - Currently improved, continue home dose of Cardizem 120 mg daily - Not on anticoagulation, is not candidate for anticoagulation due to severe epistaxis, off ASA too

## 2015-08-15 NOTE — Assessment & Plan Note (Signed)
Multiple factorials, mainly end stage COPD contributory, comfort measures, prn Lorazepam and Morphine available to him.

## 2015-10-03 ENCOUNTER — Non-Acute Institutional Stay: Payer: Medicare Other | Admitting: Nurse Practitioner

## 2015-10-03 ENCOUNTER — Encounter: Payer: Self-pay | Admitting: Nurse Practitioner

## 2015-10-03 DIAGNOSIS — R609 Edema, unspecified: Secondary | ICD-10-CM | POA: Diagnosis not present

## 2015-10-03 DIAGNOSIS — T380X5A Adverse effect of glucocorticoids and synthetic analogues, initial encounter: Secondary | ICD-10-CM

## 2015-10-03 DIAGNOSIS — J439 Emphysema, unspecified: Secondary | ICD-10-CM

## 2015-10-03 DIAGNOSIS — R739 Hyperglycemia, unspecified: Secondary | ICD-10-CM

## 2015-10-03 DIAGNOSIS — D539 Nutritional anemia, unspecified: Secondary | ICD-10-CM

## 2015-10-03 DIAGNOSIS — K5900 Constipation, unspecified: Secondary | ICD-10-CM

## 2015-10-03 DIAGNOSIS — R627 Adult failure to thrive: Secondary | ICD-10-CM | POA: Diagnosis not present

## 2015-10-03 DIAGNOSIS — E039 Hypothyroidism, unspecified: Secondary | ICD-10-CM | POA: Diagnosis not present

## 2015-10-03 DIAGNOSIS — I10 Essential (primary) hypertension: Secondary | ICD-10-CM

## 2015-10-03 DIAGNOSIS — F411 Generalized anxiety disorder: Secondary | ICD-10-CM | POA: Diagnosis not present

## 2015-10-03 DIAGNOSIS — I4891 Unspecified atrial fibrillation: Secondary | ICD-10-CM

## 2015-10-03 NOTE — Assessment & Plan Note (Signed)
Multiple factorials, mainly end stage COPD contributory, comfort measures, prn Lorazepam and Morphine available to him.    

## 2015-10-03 NOTE — Assessment & Plan Note (Signed)
Stable, continue MiraLax.  °

## 2015-10-03 NOTE — Assessment & Plan Note (Signed)
07/22/15 TSH 12.586, increase Synthroid to daily. Update TSH

## 2015-10-03 NOTE — Assessment & Plan Note (Signed)
Currently stable, continue Cardizem , Lisinopril  daily, Doamox  daily. Update CMP, trace edema seen in ankles.

## 2015-10-03 NOTE — Assessment & Plan Note (Signed)
end stage COPD on 5 L oxygen per [place] at home. On chronic prednisone,  and /day alternating dose per the patient's request. Update CBC

## 2015-10-03 NOTE — Assessment & Plan Note (Signed)
07/17/15 Lorazepam 0.5mg  bid, respiratory distress, Prednisone tapering all contributory Continue Lorazepam 0.5mg  bid and Mirtazapine 7.5mg  qhs

## 2015-10-03 NOTE — Assessment & Plan Note (Signed)
07/16/15 Hgb 11.7 07/23/15 Hgb 11.7 Update CBC

## 2015-10-03 NOTE — Assessment & Plan Note (Signed)
Noted trace ankle edema R+L, the patient said its new, he is taking Diamox, will obtain BNP, monitor the patient.

## 2015-10-03 NOTE — Progress Notes (Signed)
Patient ID: Brendan Herring, male   DOB: 12-14-1940, 75 y.o.   MRN: [id number]  [facility] Provider:  Chipper Oman NP  Code Status:  DNR Goals of care: Advanced Directive information Does patient have an advance directive?: Yes, Type of Advance Directive: Healthcare Power of [place];Living will;Out of facility DNR (pink MOST or yellow form)  Chief Complaint  Patient presents with  . Medical Management of Chronic Issues    Routine Visit     HPI: Patient is a 74 y.o. male seen in the [facility] at [facility] today for evaluation of ankle edema,  HTN, blood sugar, thyroid, COPD, anxiety, insomnia, and constipation.   Review of Systems:  Review of Systems  Constitutional: Negative for fever.  HENT: Negative for congestion, ear pain, nosebleeds and sore throat.   Eyes: Negative for redness.  Respiratory: Positive for shortness of breath. Negative for wheezing.   Cardiovascular: Positive for leg swelling. Negative for palpitations.       Ankles  Gastrointestinal: Negative for nausea and vomiting.  Genitourinary: Negative for dysuria.  Skin: Negative for rash.  Neurological: Negative for headaches.  Endo/Heme/Allergies: Does not bruise/bleed easily.  Psychiatric/Behavioral: The patient is not nervous/anxious.     Past Medical History  Diagnosis Date  . Emphysema   . Hypertension   . Coronary artery disease   . Thyroid disease   . Glaucoma   . Hypercholesterolemia   . COPD (chronic obstructive pulmonary disease) (HCC)   . Weak 07/19/2015  . Unstable gait 07/19/2015  . Oxygen dependent 07/19/2015    Patient Active Problem List   Diagnosis Date Noted  . Constipation 10/03/2015  . Edema 10/03/2015  . Failure to thrive in adult 07/24/2015  . Glaucoma, open angle 07/19/2015  . Weak 07/19/2015  . Unstable gait 07/19/2015  . 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 Grief 07/19/2015  . Loss of weight 07/19/2015  . Generalized anxiety disorder 07/17/2015  . Hypothyroidism  07/16/2015  . Epistaxis 07/10/2015  . Atrial fibrillation with RVR (HCC) 07/10/2015  . Degenerative myopia 05/17/2015  . Protein-calorie malnutrition, severe (HCC) 02/27/2015  . Macrocytic anemia 02/26/2015  . Palliative care encounter 02/26/2015  . Shortness of breath 02/26/2015  . Steroid-induced hyperglycemia 02/25/2015  . Essential hypertension 12/27/2009  . Chronic respiratory failure (HCC) 12/27/2009  . 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 11/27/2009    Allergies  Allergen Reactions  . Benazepril Other (See Comments)    sensitive to sun light  . Benazepril Hcl     REACTION: sensitive to sunlight  . Levofloxacin     REACTION: tendonitis    Medications: Patient's Medications  New Prescriptions   No medications on file  Previous Medications   ACETAZOLAMIDE (DIAMOX) 250 MG TABLET    TAKE 1 TABLET IN THE MORNING.   ALBUTEROL (PROAIR HFA) 108 (90 BASE) MCG/ACT INHALER    Inhale 2 puffs into the lungs every 4 (four) hours as needed for shortness of breath.   ALBUTEROL (PROVENTIL) (2.5 MG/3ML) 0.083% NEBULIZER SOLUTION    Take 3 mLs (2.5 mg total) by nebulization 3 (three) times daily. DX J44.1   ALPHAGAN P 0.1 % SOLN    Place 1 drop into the left eye 2 (two) times daily.   BUDESONIDE-FORMOTEROL (SYMBICORT) 160-4.5 MCG/ACT INHALER    Inhale 2 puffs into the lungs 2 (two) times daily.   CALCIUM CARBONATE-VITAMIN D (CALCIUM 600-D) 600-400 MG-UNIT PER TABLET    Take 1 tablet by mouth daily.  CHOLECALCIFEROL (VITAMIN D) 1000 UNITS TABLET    Take 1,000 Units by mouth daily.   DILTIAZEM (CARDIZEM CD) 120 MG 24 HR CAPSULE    Take 120 mg by mouth daily.   LACTOSE FREE NUTRITION (BOOST) LIQD    Take 237 mLs by mouth 3 (three) times daily between meals.   LATANOPROST (XALATAN) 0.005 % OPHTHALMIC SOLUTION    Place 1 drop into both eyes at bedtime.   LEVOTHYROXINE (SYNTHROID, LEVOTHROID) 150 MCG TABLET    Take 150 mcg by mouth daily before breakfast.    LISINOPRIL (PRINIVIL,ZESTRIL) 40 MG TABLET    Take 40 mg by mouth daily.   LORAZEPAM (ATIVAN) 0.5 MG TABLET    Take one tablet by mouth twice daily   MIRTAZAPINE (REMERON) 7.5 MG TABLET    Take 7.5 mg by mouth at bedtime.   NON FORMULARY    Oxygen 3.5p-4 Liters daily.   POLYETHYLENE GLYCOL (MIRALAX / GLYCOLAX) PACKET    Take 17 g by mouth daily as needed.   PREDNISONE (DELTASONE) 10 MG TABLET    Take 10 mg by mouth. Take one tablet by mouth alternating with 5 mg daily   SACCHAROMYCES BOULARDII (FLORASTOR) 250 MG CAPSULE    Take one tablet by mouth once daily   TIOTROPIUM BROMIDE MONOHYDRATE (SPIRIVA RESPIMAT) 2.5 MCG/ACT AERS    Take 1 puff by mouth daily.  Modified Medications   No medications on file  Discontinued Medications   MORPHINE (ROXANOL) 20 MG/ML CONCENTRATED SOLUTION    Reported on 10/03/2015   PROBIOTIC PRODUCT (PROBIOTIC PO)    Take 1 tablet by mouth daily.   SALINE NASAL SPRAY NA    Place into the nose. Reported on 10/03/2015    Physical Exam: Filed Vitals:   10/03/15 1144  BP: 134/70  Pulse: 80  Temp: 97.8 F (36.6 C)  TempSrc: Oral  Resp: 22  Height: 6\' 1"  (1.854 m)  Weight: 128 lb 12.8 oz (58.423 kg)   Body mass index is 17 kg/(m^2).  Physical Exam  Constitutional: He is oriented to person, place, and time. He appears well-developed and well-nourished. No distress.  HENT:  Head: Normocephalic and atraumatic.  Right Ear: External ear normal.  Left Ear: External ear normal.  Nose: Nose normal.  Mouth/Throat: Oropharynx is clear and moist.  Eyes: Conjunctivae and EOM are normal. Pupils are equal, round, and reactive to light. Right eye exhibits no discharge. Left eye exhibits no discharge. No scleral icterus.  Neck: Normal range of motion. Neck supple. No JVD present. No tracheal deviation present. No thyromegaly present.  Cardiovascular: Normal rate, regular rhythm and normal heart sounds.  Exam reveals no gallop and no friction rub.   No murmur  heard. Pulmonary/Chest: No stridor. No respiratory distress. He has no wheezes. He has no rales. He exhibits no tenderness.  Decreased breath sound bilaterally. Labored breathing.   Abdominal: Soft. Bowel sounds are normal. He exhibits no distension and no mass. There is no tenderness. There is no rebound and no guarding. No hernia.  Musculoskeletal: Normal range of motion. He exhibits edema. He exhibits no tenderness.  Trace edema in ankles.   Lymphadenopathy:    He has no cervical adenopathy.  Neurological: He is alert and oriented to person, place, and time. He has normal reflexes. He displays normal reflexes. No cranial nerve deficit. He exhibits normal muscle tone. Coordination normal.  Skin: Skin is warm and dry. No rash noted. He is not diaphoretic. No erythema. No pallor.  Psychiatric: He has  a normal mood and affect. His behavior is normal. Thought content normal.    Labs reviewed: Basic Metabolic Panel:  Recent Labs  16/10/96 0647 07/11/15 0207 07/12/15 0415 07/16/15 07/23/15  NA 144 145 142 142 143  K 3.5 3.8 3.5 4.0 3.6  CL 102 102 101  --   --   CO2 37* 35* 35*  --   --   GLUCOSE 181* 226* 233*  --   --   BUN 16 24* 32* 17 20  CREATININE 0.80 0.88 0.88 0.8 1.0  CALCIUM 9.3 9.4 9.3  --   --     Liver Function Tests:  Recent Labs  02/26/15 0320 04/10/15 1228 07/10/15 0647  AST 19 14 15   ALT 20 15 14*  ALKPHOS 57 48 62  BILITOT 0.2* 0.4 0.5  PROT 6.2* 6.8 6.1*  ALBUMIN 2.9* 4.2 3.1*    CBC:  Recent Labs  02/25/15 1653  07/10/15 0647 07/10/15 1141 07/10/15 1355 07/11/15 0207 07/12/15 0415 07/16/15 07/23/15  WBC 8.8  < > 12.2* 15.3* 16.1* 9.7 18.5* 11.8 14.3  NEUTROABS 6.0  --  8.0* 14.4*  --   --   --   --   --   HGB 13.1  < > 11.8* 12.4* 12.6* 11.8* 11.2* 11.7* 11.7*  HCT 41.4  < > 37.7* 39.6 40.0 37.6* 36.4* 35* 36*  MCV 104.3*  < > 107.4* 107.3* 106.7* 105.9* 107.1*  --   --   PLT 224  < > 313 356 366 291 298 259 365  < > = values in this  interval not displayed.  Lab Results  Component Value Date   TSH 12.59* 07/18/2015   Lab Results  Component Value Date   HGBA1C 6.3* 07/23/2015   No results found for: CHOL, HDL, LDLCALC, LDLDIRECT, TRIG, CHOLHDL  Significant Diagnostic Results since last visit: none  Patient Care Team: Gildardo Cranker, MD as PCP - General (Family Medicine) [name], MD as Consulting Physician (Cardiology) Mckinley Jewel, MD as Consulting Physician (Ophthalmology) Newman Pies, MD as Consulting Physician (Otolaryngology) Michele Mcalpine, MD as Consulting Physician (Pulmonary Disease)  Assessment/Plan Problem List Items Addressed This Visit    Essential hypertension - Primary (Chronic)    Currently stable, continue Cardizem 120mg , Lisinopril 40mg  daily, Doamox 250mg  daily. Update CMP, trace edema seen in ankles.       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 On chronic prednisone, 5mg  and 10mg /day alternating dose per the patient's request. Update CBC      Steroid-induced hyperglycemia (Chronic)    Last Hgb A1c 6.3 07/23/15, will monitor fasting CBG qam, re-eval, obtain Hgb A1c.       Macrocytic anemia (Chronic)    07/16/15 Hgb 11.7 07/23/15 Hgb 11.7 Update CBC      Atrial fibrillation with RVR (HCC) (Chronic)    Transient Atrial fibrillation with RVR (HCC): New onset likely due to respiratory failure and tachypnea when in hospital.  - Currently improved, continue home dose of Cardizem 120 mg daily - Not on anticoagulation, is not candidate for anticoagulation due to severe epistaxis, off ASA too      Hypothyroidism (Chronic)    07/22/15 TSH 12.586, increase Synthroid to daily. Update TSH      Generalized anxiety disorder (Chronic)    07/17/15 Lorazepam 0.5mg  bid, respiratory distress, Prednisone tapering all contributory Continue Lorazepam 0.5mg  bid and Mirtazapine  7.5mg  qhs       Failure to thrive in adult    Multiple factorials, mainly end stage COPD contributory, comfort measures, prn Lorazepam and Morphine available to him.       Constipation    Stable, continue MiraLax.       Edema    Noted trace ankle edema R+L, the patient said its new, he is taking Diamox, will obtain BNP, monitor the patient.           Family/ staff Communication: continue AL for care needs.   Labs/tests ordered: CBC, CMP, BNP, TSH, Hgb A1c, fasting CBG ac breakfast.   Chipper Oman NP Geriatrics [facility] [address], [place] [address] On Call:  [phone] & follow prompts after 5pm & weekends Office Phone:  [phone] Office Fax:  [phone]

## 2015-10-03 NOTE — Assessment & Plan Note (Signed)
Last Hgb A1c 6.3 07/23/15, will monitor fasting CBG qam, re-eval, obtain Hgb A1c.

## 2015-10-03 NOTE — Assessment & Plan Note (Signed)
Transient Atrial fibrillation with RVR (HCC): New onset likely due to respiratory failure and tachypnea when in hospital.  - Currently improved, continue home dose of Cardizem 120 mg daily - Not on anticoagulation, is not candidate for anticoagulation due to severe epistaxis, off ASA too  

## 2015-10-08 LAB — BASIC METABOLIC PANEL
BUN: 21 mg/dL (ref 4–21)
Creatinine: 0.9 mg/dL (ref 0.6–1.3)
Glucose: 96 mg/dL
Potassium: 3.9 mmol/L (ref 3.4–5.3)
Sodium: 145 mmol/L (ref 137–147)

## 2015-10-08 LAB — HEPATIC FUNCTION PANEL
ALK PHOS: 53 U/L (ref 25–125)
ALT: 7 U/L — AB (ref 10–40)
AST: 10 U/L — AB (ref 14–40)

## 2015-10-08 LAB — CBC AND DIFFERENTIAL
HEMATOCRIT: 34 % — AB (ref 41–53)
Hemoglobin: 10.6 g/dL — AB (ref 13.5–17.5)
Platelets: 345 10*3/uL (ref 150–399)
WBC: 7.3 10*3/mL

## 2015-10-08 LAB — HEMOGLOBIN A1C: HEMOGLOBIN A1C: 6.2

## 2015-10-08 LAB — TSH: TSH: 0.6 u[IU]/mL (ref 0.41–5.90)

## 2015-10-24 ENCOUNTER — Encounter: Payer: Self-pay | Admitting: Nurse Practitioner

## 2015-10-24 ENCOUNTER — Non-Acute Institutional Stay: Payer: Medicare Other | Admitting: Nurse Practitioner

## 2015-10-24 DIAGNOSIS — R609 Edema, unspecified: Secondary | ICD-10-CM | POA: Diagnosis not present

## 2015-10-24 DIAGNOSIS — R627 Adult failure to thrive: Secondary | ICD-10-CM

## 2015-10-24 DIAGNOSIS — T380X5A Adverse effect of glucocorticoids and synthetic analogues, initial encounter: Secondary | ICD-10-CM

## 2015-10-24 DIAGNOSIS — F411 Generalized anxiety disorder: Secondary | ICD-10-CM

## 2015-10-24 DIAGNOSIS — E039 Hypothyroidism, unspecified: Secondary | ICD-10-CM

## 2015-10-24 DIAGNOSIS — K5900 Constipation, unspecified: Secondary | ICD-10-CM | POA: Diagnosis not present

## 2015-10-24 DIAGNOSIS — R739 Hyperglycemia, unspecified: Secondary | ICD-10-CM | POA: Diagnosis not present

## 2015-10-24 DIAGNOSIS — D539 Nutritional anemia, unspecified: Secondary | ICD-10-CM

## 2015-10-24 DIAGNOSIS — I10 Essential (primary) hypertension: Secondary | ICD-10-CM

## 2015-10-24 DIAGNOSIS — J439 Emphysema, unspecified: Secondary | ICD-10-CM

## 2015-10-24 DIAGNOSIS — I4891 Unspecified atrial fibrillation: Secondary | ICD-10-CM | POA: Diagnosis not present

## 2015-10-25 NOTE — Assessment & Plan Note (Signed)
Transient Atrial fibrillation with RVR (HCC): New onset likely due to respiratory failure and tachypnea when in hospital.  - Currently improved, continue home dose of Cardizem 120 mg daily - Not on anticoagulation, is not candidate for anticoagulation due to severe epistaxis, off ASA too  

## 2015-10-25 NOTE — Assessment & Plan Note (Signed)
Stable, continue MiraLax.  °

## 2015-10-25 NOTE — Assessment & Plan Note (Signed)
end stage COPD on 5 L oxygen per [place] at home. On chronic prednisone, 5mg  and 10mg /day alternating dose per the patient's request. Continue Symbicort bid, Albuterol q4h prn.

## 2015-10-25 NOTE — Assessment & Plan Note (Signed)
07/22/15 TSH 12.586, 10/08/15 TSH 0.6, continue  Synthroid 150mcg daily

## 2015-10-25 NOTE — Assessment & Plan Note (Signed)
Controlled, continue Cardizem 120mg , Lisinopril 40mg  daily, Doamox 250mg  daily.

## 2015-10-25 NOTE — Assessment & Plan Note (Signed)
Noted trace ankle edema R+L, the patient said its new, he is taking Diamox

## 2015-10-25 NOTE — Assessment & Plan Note (Signed)
07/17/15 Lorazepam 0.5mg  bid, respiratory distress, Prednisone tapering all contributory Continue Lorazepam 0.5mg  bid and Mirtazapine 7.5mg  qhs

## 2015-10-25 NOTE — Assessment & Plan Note (Signed)
Multiple factorials, mainly end stage COPD contributory, comfort measures, prn Lorazepam and Morphine available to him.    

## 2015-10-25 NOTE — Progress Notes (Signed)
Patient ID: Brendan Herring, male   DOB: 12/28/1940, 75 y.o.   MRN: [id number]  Location:  [name] Room Number: 925 Place of Service: [facility] Provider:  Chipper Oman NP   Duane Lope, MD  Patient Care Team: Gildardo Cranker, MD as PCP - General (Family Medicine) [name], MD as Consulting Physician (Cardiology) Mckinley Jewel, MD as Consulting Physician (Ophthalmology) Newman Pies, MD as Consulting Physician (Otolaryngology) Michele Mcalpine, MD as Consulting Physician (Pulmonary Disease)  Extended Emergency Contact Information Primary Emergency Contact: Prosch,Nathan Address: [address]          [place], [place] Home Phone: [phone] Work Phone: [phone] Relation: Son Secondary Emergency Contact: Burmester,Kimberly Address: [address]          [place], [place] [address] [place] Home Phone: [phone] Work Phone: [phone] Mobile Phone: [phone] Relation: Daughter  Code Status:  DNR Goals of care: Advanced Directive information Advanced Directives 10/24/2015  Does patient have an advance directive? Yes  Type of Estate agent of [place];Out of facility DNR (pink MOST or yellow form)  Does patient want to make changes to advanced directive? No - Patient declined  Copy of advanced directive(s) in chart? Yes     Chief Complaint  Patient presents with  . Diabetes    labs    HPI:  Pt is a 75 y.o. male seen today for medical management of chronic diseases.  Hx of ankle edema, takign Diamox  daily.  HTN, controlled while on Diamox , Cardizem  daily, Lisinopril . blood sugar, diet controlled, CBG <126 fasting in am in the past, currently monitor x2/week.  thyroid, taking Levothyroxine , last TSH 0.6 10/08/15 COPD, O2, DOE, taking Albuterol q4h prn, Symbicort bid, Prednisone. anxiety, insomnia, are stable while on Lorazepam bid and Mirtazapine 7.5mg . and  constipation, stable while on Miralax. Ambulates with walker, stronger, but apparent DOE noted.     Past Medical History  Diagnosis Date  . Emphysema   . Hypertension   . Coronary artery disease   . Thyroid disease   . Glaucoma   . Hypercholesterolemia   . COPD (chronic obstructive pulmonary disease) (HCC)   . Weak 07/19/2015  . Unstable gait 07/19/2015  . Oxygen dependent 07/19/2015   Past Surgical History  Procedure Laterality Date  . Back surgery  1982  . Cataract extraction  1996/2003    bilateral  . Tonsillectomy  1943  . Wisdom tooth extraction  1977  . Vasectomy  1970  . Coronary angioplasty with stent placement  2009    Allergies  Allergen Reactions  . Benazepril Other (See Comments)    sensitive to sun light  . Benazepril Hcl     REACTION: sensitive to sunlight  . Levofloxacin     REACTION: tendonitis      Medication List       This list is accurate as of: 10/24/15 11:59 PM.  Always use your most recent med list.               acetaZOLAMIDE 250 MG tablet  Commonly known as:  DIAMOX  TAKE 1 TABLET IN THE MORNING.     albuterol 108 (90 Base) MCG/ACT inhaler  Commonly known as:  PROAIR HFA  Inhale 2 puffs into the lungs every 4 (four) hours as needed for shortness of breath.     albuterol (2.5 MG/3ML) 0.083% nebulizer solution  Commonly known as:  PROVENTIL  Take 3 mLs (2.5 mg total) by nebulization 3 (three) times daily. DX J44.1     brimonidine 0.2 % ophthalmic solution  Commonly known as:  ALPHAGAN  Place 1 drop into the left eye 2 (two) times daily.     budesonide-formoterol 160-4.5 MCG/ACT inhaler  Commonly known as:  SYMBICORT  Inhale 2 puffs into the lungs 2 (two) times daily.     CALCIUM 600-D 600-400 MG-UNIT tablet  Generic drug:  Calcium Carbonate-Vitamin D  Take 1 tablet by mouth daily.     cholecalciferol 1000 units tablet  Commonly known as:  VITAMIN D  Take 1,000 Units by mouth daily.     diltiazem 120 MG 24 hr capsule    Commonly known as:  CARDIZEM CD  Take 120 mg by mouth daily.     lactose free nutrition Liqd  Take 237 mLs by mouth 3 (three) times daily between meals.     latanoprost 0.005 % ophthalmic solution  Commonly known as:  XALATAN  Place 1 drop into both eyes at bedtime.     levothyroxine 150 MCG tablet  Commonly known as:  SYNTHROID, LEVOTHROID  Take 150 mcg by mouth daily before breakfast.     lisinopril 40 MG tablet  Commonly known as:  PRINIVIL,ZESTRIL  Take 40 mg by mouth daily.     LORazepam 0.5 MG tablet  Commonly known as:  ATIVAN  Take one tablet by mouth twice daily     mirtazapine 7.5 MG tablet  Commonly known as:  REMERON  Take 7.5 mg by mouth at bedtime.     NON FORMULARY  Oxygen 3.5p-4 Liters daily.     polyethylene glycol packet  Commonly known as:  MIRALAX / GLYCOLAX  Take 17 g by mouth daily as needed.     predniSONE 10 MG tablet  Commonly known as:  DELTASONE  Take 10 mg by mouth. Take one tablet by mouth alternating with 5 mg daily     saccharomyces boulardii 250 MG capsule  Commonly known as:  FLORASTOR  Take one tablet by mouth once daily     Tiotropium Bromide Monohydrate 2.5 MCG/ACT Aers  Commonly known as:  SPIRIVA RESPIMAT  Take 1 puff by mouth daily.        Review of Systems  Constitutional: Negative for fever, appetite change and fatigue.  HENT: Negative for congestion, ear pain, nosebleeds and sore throat.   Eyes: Negative for redness.  Respiratory: Positive for shortness of breath. Negative for wheezing.        DOE  Cardiovascular: Positive for leg swelling. Negative for palpitations.       Ankles  Gastrointestinal: Negative for nausea and vomiting.  Genitourinary: Positive for frequency and flank pain. Negative for dysuria.  Musculoskeletal: Positive for arthralgias and gait problem. Negative for joint swelling.       Ambulates with walker  Skin: Negative for rash.  Neurological: Negative for tremors, weakness and headaches.   Hematological: Does not bruise/bleed easily.  Psychiatric/Behavioral: Negative for confusion and sleep disturbance. The patient is not nervous/anxious.     Immunization History  Administered Date(s) Administered  . Influenza Split 05/16/2012, 06/17/2013  . Influenza Whole 05/17/2009, 04/24/2010, 05/18/2011  . Influenza,inj,Quad PF,36+ Mos 04/20/2014  . Influenza-Unspecified 04/18/2015  . Pneumococcal Polysaccharide-23 05/17/2009, 04/18/2015   Pertinent  Health Maintenance Due  Topic Date Due  . INFLUENZA VACCINE  03/17/2016  . PNA vac Low Risk Adult (2 of 2 - PCV13) 04/17/2016   No flowsheet data found. Functional  Status Survey:    Filed Vitals:   10/24/15 1222  BP: 123/70  Pulse: 94  Temp: 98.6 F (37 C)  TempSrc: Oral  Resp: 20  Height:  (1.854 m)  Weight: 128 lb 12.8 oz (58.423 kg)   Body mass index is 17 kg/(m^2). Physical Exam  Constitutional: He is oriented to person, place, and time. He appears well-developed and well-nourished. No distress.  HENT:  Head: Normocephalic and atraumatic.  Right Ear: External ear normal.  Left Ear: External ear normal.  Nose: Nose normal.  Mouth/Throat: Oropharynx is clear and moist.  Eyes: Conjunctivae and EOM are normal. Pupils are equal, round, and reactive to light. Right eye exhibits no discharge. Left eye exhibits no discharge. No scleral icterus.  Neck: Normal range of motion. Neck supple. No JVD present. No tracheal deviation present. No thyromegaly present.  Cardiovascular: Normal rate, regular rhythm and normal heart sounds.  Exam reveals no gallop and no friction rub.   No murmur heard. Pulmonary/Chest: No stridor. No respiratory distress. He has no wheezes. He has no rales. He exhibits no tenderness.  Decreased breath sound bilaterally. DOE. O2 [place]  Abdominal: Soft. Bowel sounds are normal. He exhibits no distension and no mass. There is no tenderness. There is no rebound and no guarding. No hernia.   Musculoskeletal: Normal range of motion. He exhibits edema. He exhibits no tenderness.  Trace edema in ankles.   Lymphadenopathy:    He has no cervical adenopathy.  Neurological: He is alert and oriented to person, place, and time. He has normal reflexes. He displays normal reflexes. No cranial nerve deficit. He exhibits normal muscle tone. Coordination normal.  Skin: Skin is warm and dry. No rash noted. He is not diaphoretic. No erythema. No pallor.  Psychiatric: He has a normal mood and affect. His behavior is normal. Thought content normal.    Labs reviewed:  Recent Labs  07/10/15 0647 07/11/15 0207 07/12/15 0415 07/16/15 07/23/15 10/08/15  NA 144 145 142 142 143 145  K 3.5 3.8 3.5 4.0 3.6 3.9  CL 102 102 101  --   --   --   CO2 37* 35* 35*  --   --   --   GLUCOSE 181* 226* 233*  --   --   --   BUN 16 24* 32* CREATININE 0.80 0.88 0.88 0.8 1.0 0.9  CALCIUM 9.3 9.4 9.3  --   --   --     Recent Labs  02/26/15 0320 04/10/15 1228 07/10/15 0647 10/08/15  AST 10*  ALT 20 15 14* 7*  ALKPHOS 57 48 62 53  BILITOT 0.2* 0.4 0.5  --   PROT 6.2* 6.8 6.1*  --   ALBUMIN 2.9* 4.2 3.1*  --     Recent Labs  02/25/15 1653  07/10/15 0647 07/10/15 1141 07/10/15 1355 07/11/15 0207 07/12/15 0415 07/16/15 07/23/15 10/08/15  WBC 8.8  < > 12.2* 15.3* 16.1* 9.7 18.5* 11.8 14.3 7.3  NEUTROABS 6.0  --  8.0* 14.4*  --   --   --   --   --   --   HGB 13.1  < > 11.8* 12.4* 12.6* 11.8* 11.2* 11.7* 11.7* 10.6*  HCT 41.4  < > 37.7* 39.6 40.0 37.6* 36.4* 35* 36* 34*  MCV 104.3*  < > 107.4* 107.3* 106.7* 105.9* 107.1*  --   --   --   PLT 224  < > 313 356 366 291 298  259 365 345  < > = values in this interval not displayed. Lab Results  Component Value Date   TSH 0.60 10/08/2015   Lab Results  Component Value Date   HGBA1C 6.2 10/08/2015   No results found for: CHOL, HDL, LDLCALC, LDLDIRECT, TRIG, CHOLHDL  Significant Diagnostic Results in last 30 days:  No results  found.  Assessment/Plan  Atrial fibrillation with RVR (HCC) Transient Atrial fibrillation with RVR (HCC): New onset likely due to respiratory failure and tachypnea when in hospital.  - Currently improved, continue home dose of Cardizem 120 mg daily - Not on anticoagulation, is not candidate for anticoagulation due to severe epistaxis, off ASA too  Constipation Stable, continue MiraLax.   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 end stage COPD on 5 L oxygen per [place] at home. On chronic prednisone, 5mg  and 10mg /day alternating dose per the patient's request. Continue Symbicort bid, Albuterol q4h prn.   Edema Noted trace ankle edema R+L, the patient said its new, he is taking Diamox  Essential hypertension Controlled, continue Cardizem 120mg , Lisinopril 40mg  daily, Doamox 250mg  daily.   Failure to thrive in adult Multiple factorials, mainly end stage COPD contributory, comfort measures, prn Lorazepam and Morphine available to him.   Generalized anxiety disorder 07/17/15 Lorazepam 0.5mg  bid, respiratory distress, Prednisone tapering all contributory Continue Lorazepam 0.5mg  bid and Mirtazapine 7.5mg  qhs   Hypothyroidism 07/22/15 TSH 12.586, 10/08/15 TSH 0.6, continue  Synthroid daily  Macrocytic anemia 07/16/15 Hgb 11.7 07/23/15 Hgb 11.7 10/07/14 Hgb 10.6   Steroid-induced hyperglycemia 10/08/15 Hgb a1c 6.2 Continue diet control, check CBG ac breakfast 2x/week.     Family/ staff Communication: continue AL for care needs  Labs/tests ordered:  none

## 2015-10-25 NOTE — Assessment & Plan Note (Signed)
10/08/15 Hgb a1c 6.2 Continue diet control, check CBG ac breakfast 2x/week.

## 2015-10-25 NOTE — Assessment & Plan Note (Signed)
07/16/15 Hgb 11.7 07/23/15 Hgb 11.7 10/07/14 Hgb 10.6

## 2015-11-21 ENCOUNTER — Encounter: Payer: Self-pay | Admitting: Nurse Practitioner

## 2015-11-21 ENCOUNTER — Non-Acute Institutional Stay: Payer: Medicare Other | Admitting: Nurse Practitioner

## 2015-11-21 VITALS — BP 162/80 | HR 93 | Temp 98.2°F | Ht 73.0 in | Wt 127.4 lb

## 2015-11-21 DIAGNOSIS — D539 Nutritional anemia, unspecified: Secondary | ICD-10-CM | POA: Diagnosis not present

## 2015-11-21 DIAGNOSIS — K5900 Constipation, unspecified: Secondary | ICD-10-CM | POA: Diagnosis not present

## 2015-11-21 DIAGNOSIS — E039 Hypothyroidism, unspecified: Secondary | ICD-10-CM

## 2015-11-21 DIAGNOSIS — R739 Hyperglycemia, unspecified: Secondary | ICD-10-CM

## 2015-11-21 DIAGNOSIS — H4010X0 Unspecified open-angle glaucoma, stage unspecified: Secondary | ICD-10-CM

## 2015-11-21 DIAGNOSIS — I4891 Unspecified atrial fibrillation: Secondary | ICD-10-CM

## 2015-11-21 DIAGNOSIS — T380X5A Adverse effect of glucocorticoids and synthetic analogues, initial encounter: Secondary | ICD-10-CM

## 2015-11-21 DIAGNOSIS — R609 Edema, unspecified: Secondary | ICD-10-CM

## 2015-11-21 DIAGNOSIS — F411 Generalized anxiety disorder: Secondary | ICD-10-CM | POA: Diagnosis not present

## 2015-11-21 DIAGNOSIS — J439 Emphysema, unspecified: Secondary | ICD-10-CM

## 2015-11-21 DIAGNOSIS — J9611 Chronic respiratory failure with hypoxia: Secondary | ICD-10-CM

## 2015-11-21 DIAGNOSIS — I10 Essential (primary) hypertension: Secondary | ICD-10-CM

## 2015-11-21 NOTE — Assessment & Plan Note (Signed)
end stage COPD on 5 L oxygen per [place] at home. On chronic prednisone, 5mg and 10mg/day alternating dose per the patient's request. Continue Symbicort bid, Albuterol neb tid, Albuterol HFA 2puffs 4hr prn, Tiotropium 1 puff daily.   

## 2015-11-21 NOTE — Assessment & Plan Note (Signed)
07/16/15 Hgb 11.7 07/23/15 Hgb 11.7 10/07/14 Hgb 10.6

## 2015-11-21 NOTE — Progress Notes (Signed)
Patient ID: Brendan Herring, male   DOB: 03/29/1941, 75 y.o.   MRN: [id number]  Location:  [facility]   Place of Service: clinic [facility] Provider:  Arna Snipe Kursten Kruk NP   Duane Lope, MD  Patient Care Team: Gildardo Cranker, MD as PCP - General (Family Medicine) [name], MD as Consulting Physician (Cardiology) Mckinley Jewel, MD as Consulting Physician (Ophthalmology) Newman Pies, MD as Consulting Physician (Otolaryngology) Michele Mcalpine, MD as Consulting Physician (Pulmonary Disease)  Extended Emergency Contact Information Primary Emergency Contact: Koestner,Nathan Address: [address]          [place], [place] Home Phone: [phone] Work Phone: [phone] Relation: Son Secondary Emergency Contact: Bartell,Kimberly Address: [address]          [place], [place] [address] [place] Home Phone: [phone] Work Phone: [phone] Mobile Phone: [phone] Relation: Daughter  Code Status:  DNR Goals of care: Advanced Directive information Advanced Directives 11/21/2015  Does patient have an advance directive? Yes  Type of Estate agent of [place];Out of facility DNR (pink MOST or yellow form)  Does patient want to make changes to advanced directive? No - Patient declined  Copy of advanced directive(s) in chart? Yes     Chief Complaint  Patient presents with  . Establish Care    New patient    HPI:  Pt is a 75 y.o. male seen today for medical management of chronic diseases.  Hx of ankle edema, taking Diamox  daily.  HTN, controlled while on Diamox , Cardizem  daily, Lisinopril . blood sugar, diet controlled, CBG <126 fasting in am in the past, desires CBG once month,   thyroid, taking Levothyroxine , last TSH 0.6 10/08/15 COPD, O2, DOE, taking Albuterol q4h prn, Symbicort bid, Prednisone. anxiety, insomnia, are stable, wants to change Lorazepam bid to prn and Mirtazapine 7.5mg . and  constipation, stable while on Miralax. Ambulates with walker, stronger, but apparent DOE noted.     Past Medical History  Diagnosis Date  . Emphysema   . Hypertension   . Coronary artery disease   . Thyroid disease   . Glaucoma   . Hypercholesterolemia   . COPD (chronic obstructive pulmonary disease) (HCC)   . Weak 07/19/2015  . Unstable gait 07/19/2015  . Oxygen dependent 07/19/2015   Past Surgical History  Procedure Laterality Date  . Back surgery  1982  . Cataract extraction  1996/2003    bilateral  . Tonsillectomy  1943  . Wisdom tooth extraction  1977  . Vasectomy  1970  . Coronary angioplasty with stent placement  2009    Allergies  Allergen Reactions  . Benazepril Other (See Comments)    sensitive to sun light  . Benazepril Hcl     REACTION: sensitive to sunlight  . Levofloxacin     REACTION: tendonitis      Medication List       This list is accurate as of: 11/21/15  4:54 PM.  Always use your most recent med list.               acetaZOLAMIDE 250 MG tablet  Commonly known as:  DIAMOX  TAKE 1 TABLET IN THE MORNING.     albuterol 108 (90 Base) MCG/ACT inhaler  Commonly known as:  PROAIR HFA  Inhale 2 puffs into the lungs every 4 (four) hours as needed for shortness of breath.     albuterol (2.5 MG/3ML) 0.083% nebulizer solution  Commonly  known as:  PROVENTIL  Take 3 mLs (2.5 mg total) by nebulization 3 (three) times daily. DX J44.1     brimonidine 0.2 % ophthalmic solution  Commonly known as:  ALPHAGAN  Place 1 drop into the left eye 2 (two) times daily.     budesonide-formoterol 160-4.5 MCG/ACT inhaler  Commonly known as:  SYMBICORT  Inhale 2 puffs into the lungs 2 (two) times daily.     CALCIUM 600-D 600-400 MG-UNIT tablet  Generic drug:  Calcium Carbonate-Vitamin D  Take 1 tablet by mouth daily.     cholecalciferol 1000 units tablet  Commonly known as:  VITAMIN D  Take 1,000 Units by mouth daily.     diltiazem 120 MG 24 hr capsule    Commonly known as:  CARDIZEM CD  Take 120 mg by mouth daily.     lactose free nutrition Liqd  Take 237 mLs by mouth 3 (three) times daily between meals.     latanoprost 0.005 % ophthalmic solution  Commonly known as:  XALATAN  Place 1 drop into both eyes at bedtime.     levothyroxine 150 MCG tablet  Commonly known as:  SYNTHROID, LEVOTHROID  Take 150 mcg by mouth daily before breakfast.     lisinopril 40 MG tablet  Commonly known as:  PRINIVIL,ZESTRIL  Take 40 mg by mouth daily.     LORazepam 0.5 MG tablet  Commonly known as:  ATIVAN  Take one tablet by mouth twice daily     mirtazapine 7.5 MG tablet  Commonly known as:  REMERON  Take 7.5 mg by mouth at bedtime.     polyethylene glycol packet  Commonly known as:  MIRALAX / GLYCOLAX  Take 17 g by mouth daily as needed.     predniSONE 10 MG tablet  Commonly known as:  DELTASONE  Take 10 mg by mouth. Take one tablet by mouth alternating with 5 mg daily     predniSONE 5 MG tablet  Commonly known as:  DELTASONE  Take 5 mg by mouth daily with breakfast. Take one tablet daily alternating with 10 mg     saccharomyces boulardii 250 MG capsule  Commonly known as:  FLORASTOR  Take one tablet by mouth once daily     sodium chloride 0.65 % Soln nasal spray  Commonly known as:  OCEAN  Place 2 sprays into both nostrils as needed for congestion.     Tiotropium Bromide Monohydrate 2.5 MCG/ACT Aers  Commonly known as:  SPIRIVA RESPIMAT  Take 1 puff by mouth daily.        Review of Systems  Constitutional: Negative for fever, appetite change and fatigue.  HENT: Negative for congestion, ear pain, nosebleeds and sore throat.   Eyes: Negative for redness.  Respiratory: Positive for shortness of breath. Negative for wheezing.        DOE  Cardiovascular: Positive for leg swelling. Negative for palpitations.       Ankles  Gastrointestinal: Negative for nausea and vomiting.  Genitourinary: Positive for frequency and flank pain.  Negative for dysuria.  Musculoskeletal: Positive for arthralgias and gait problem. Negative for joint swelling.       Ambulates with walker  Skin: Negative for rash.  Neurological: Negative for tremors, weakness and headaches.  Hematological: Does not bruise/bleed easily.  Psychiatric/Behavioral: Negative for confusion and sleep disturbance. The patient is not nervous/anxious.     Immunization History  Administered Date(s) Administered  . Influenza Split 05/16/2012, 06/17/2013  . Influenza Whole 05/17/2009, 04/24/2010, 05/18/2011  .  Influenza,inj,Quad PF,36+ Mos 04/20/2014  . Influenza-Unspecified 04/18/2015  . Pneumococcal Polysaccharide-23 05/17/2009, 04/18/2015   Pertinent  Health Maintenance Due  Topic Date Due  . INFLUENZA VACCINE  03/17/2016  . PNA vac Low Risk Adult (2 of 2 - PCV13) 04/17/2016   Fall Risk  11/21/2015  Falls in the past year? No   Functional Status Survey:    Filed Vitals:   11/21/15 1538  BP: 162/80  Pulse: 93  Temp: 98.2 F (36.8 C)  TempSrc: Oral  Height: 6\' 1"  (1.854 m)  Weight: 127 lb 6.4 oz (57.788 kg)  SpO2: 90%   Body mass index is 16.81 kg/(m^2). Physical Exam  Constitutional: He is oriented to person, place, and time. He appears well-developed and well-nourished. No distress.  HENT:  Head: Normocephalic and atraumatic.  Right Ear: External ear normal.  Left Ear: External ear normal.  Nose: Nose normal.  Mouth/Throat: Oropharynx is clear and moist.  Eyes: Conjunctivae and EOM are normal. Pupils are equal, round, and reactive to light. Right eye exhibits no discharge. Left eye exhibits no discharge. No scleral icterus.  Neck: Normal range of motion. Neck supple. No JVD present. No tracheal deviation present. No thyromegaly present.  Cardiovascular: Normal rate, regular rhythm and normal heart sounds.  Exam reveals no gallop and no friction rub.   No murmur heard. Pulmonary/Chest: No stridor. No respiratory distress. He has no wheezes.  He has no rales. He exhibits no tenderness.  Decreased breath sound bilaterally. DOE. O2 [place]  Abdominal: Soft. Bowel sounds are normal. He exhibits no distension and no mass. There is no tenderness. There is no rebound and no guarding. No hernia.  Genitourinary: Guaiac negative stool.  Musculoskeletal: Normal range of motion. He exhibits edema. He exhibits no tenderness.  Trace edema in ankles.   Lymphadenopathy:    He has no cervical adenopathy.  Neurological: He is alert and oriented to person, place, and time. He has normal reflexes. He displays normal reflexes. No cranial nerve deficit. He exhibits normal muscle tone. Coordination normal.  Skin: Skin is warm and dry. No rash noted. He is not diaphoretic. No erythema. No pallor.  Psychiatric: He has a normal mood and affect. His behavior is normal. Thought content normal.    Labs reviewed:  Recent Labs  07/10/15 0647 07/11/15 0207 07/12/15 0415 07/16/15 07/23/15 10/08/15  NA 144 145 142 142 143 145  K 3.5 3.8 3.5 4.0 3.6 3.9  CL 102 102 101  --   --   --   CO2 37* 35* 35*  --   --   --   GLUCOSE 181* 226* 233*  --   --   --   BUN 16 24* 32* 17 20 21   CREATININE 0.80 0.88 0.88 0.8 1.0 0.9  CALCIUM 9.3 9.4 9.3  --   --   --     Recent Labs  02/26/15 0320 04/10/15 1228 07/10/15 0647 10/08/15  AST 19 14 15  10*  ALT 20 15 14* 7*  ALKPHOS 57 48 62 53  BILITOT 0.2* 0.4 0.5  --   PROT 6.2* 6.8 6.1*  --   ALBUMIN 2.9* 4.2 3.1*  --     Recent Labs  02/25/15 1653  07/10/15 0647 07/10/15 1141 07/10/15 1355 07/11/15 0207 07/12/15 0415 07/16/15 07/23/15 10/08/15  WBC 8.8  < > 12.2* 15.3* 16.1* 9.7 18.5* 11.8 14.3 7.3  NEUTROABS 6.0  --  8.0* 14.4*  --   --   --   --   --   --  HGB 13.1  < > 11.8* 12.4* 12.6* 11.8* 11.2* 11.7* 11.7* 10.6*  HCT 41.4  < > 37.7* 39.6 40.0 37.6* 36.4* 35* 36* 34*  MCV 104.3*  < > 107.4* 107.3* 106.7* 105.9* 107.1*  --   --   --   PLT 224  < > 313 356 366 291 298 259 365 345  < > = values in  this interval not displayed. Lab Results  Component Value Date   TSH 0.60 10/08/2015   Lab Results  Component Value Date   HGBA1C 6.2 10/08/2015   No results found for: CHOL, HDL, LDLCALC, LDLDIRECT, TRIG, CHOLHDL  Significant Diagnostic Results in last 30 days:  No results found.  Assessment/Plan  Atrial fibrillation with RVR (HCC) Transient Atrial fibrillation with RVR (HCC): New onset likely due to respiratory failure and tachypnea when in hospital.  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 end stage COPD on 5 L oxygen per [place] at home. On chronic prednisone,  and /day alternating dose per the patient's request. Continue Symbicort bid, Albuterol neb tid, Albuterol HFA 2puffs 4hr prn, Tiotropium 1 puff daily.    Glaucoma, open angle Continue Diamox  daily, Alphgan bid left eye, Xalatan bid  Hypothyroidism 09/21/14 TSH 12.586, 10/08/15 TSH 0.6, continue  Synthroid daily, update TSH 6 weeks.   Essential hypertension Controlled, continue Cardizem , Lisinopril  daily, Doamox  daily.   Generalized anxiety disorder 07/17/15 Lorazepam 0.5mg  bid, respiratory distress, Prednisone tapering all contributory Stable. Change Lorazepam 0.5mg  bid prn and Mirtazapine 7.5mg  qhs  Macrocytic anemia 07/16/15 Hgb 11.7 07/23/15 Hgb 11.7 10/07/14 Hgb 10.6   Steroid-induced hyperglycemia 10/08/15 Hgb a1c 6.2 Continue diet control, check CBG ac breakfast 1x/month  Edema No apparent edema    Family/ staff Communication: continue AL for care needs  Labs/tests ordered:  TSH

## 2015-11-21 NOTE — Assessment & Plan Note (Signed)
07/17/15 Lorazepam 0.5mg bid, respiratory distress, Prednisone tapering all contributory Stable. Change Lorazepam 0.5mg bid prn and Mirtazapine 7.5mg qhs  

## 2015-11-21 NOTE — Assessment & Plan Note (Signed)
10/08/15 Hgb a1c 6.2 Continue diet control, check CBG ac breakfast 1x/month

## 2015-11-21 NOTE — Assessment & Plan Note (Signed)
Stable, continue  MiraLax prn 

## 2015-11-21 NOTE — Assessment & Plan Note (Signed)
Controlled, continue Cardizem 120mg , Lisinopril 40mg  daily, Doamox 250mg  daily.

## 2015-11-21 NOTE — Assessment & Plan Note (Signed)
Continue O2 

## 2015-11-21 NOTE — Assessment & Plan Note (Addendum)
Continue Diamox 250mg  daily, Alphgan bid left eye, Xalatan bid

## 2015-11-21 NOTE — Assessment & Plan Note (Signed)
Transient Atrial fibrillation with RVR (HCC): New onset likely due to respiratory failure and tachypnea when in hospital.  - Currently improved, continue home dose of Cardizem 120 mg daily - Not on anticoagulation, is not candidate for anticoagulation due to severe epistaxis, off ASA too  

## 2015-11-21 NOTE — Assessment & Plan Note (Signed)
No apparent edema 

## 2015-11-21 NOTE — Assessment & Plan Note (Addendum)
09/21/14 TSH 12.586, 10/08/15 TSH 0.6, continue  Synthroid 150mcg daily, update TSH 6 weeks.

## 2016-01-02 LAB — TSH: TSH: 1.09 u[IU]/mL (ref <=5.90)

## 2016-01-23 ENCOUNTER — Non-Acute Institutional Stay: Payer: Medicare Other | Admitting: Nurse Practitioner

## 2016-01-23 ENCOUNTER — Encounter: Payer: Self-pay | Admitting: Nurse Practitioner

## 2016-01-23 DIAGNOSIS — I4891 Unspecified atrial fibrillation: Secondary | ICD-10-CM | POA: Diagnosis not present

## 2016-01-23 DIAGNOSIS — E039 Hypothyroidism, unspecified: Secondary | ICD-10-CM | POA: Diagnosis not present

## 2016-01-23 DIAGNOSIS — F411 Generalized anxiety disorder: Secondary | ICD-10-CM | POA: Diagnosis not present

## 2016-01-23 DIAGNOSIS — D539 Nutritional anemia, unspecified: Secondary | ICD-10-CM | POA: Diagnosis not present

## 2016-01-23 DIAGNOSIS — R609 Edema, unspecified: Secondary | ICD-10-CM | POA: Diagnosis not present

## 2016-01-23 DIAGNOSIS — R627 Adult failure to thrive: Secondary | ICD-10-CM

## 2016-01-23 DIAGNOSIS — I10 Essential (primary) hypertension: Secondary | ICD-10-CM

## 2016-01-23 DIAGNOSIS — J439 Emphysema, unspecified: Secondary | ICD-10-CM

## 2016-01-23 DIAGNOSIS — K5900 Constipation, unspecified: Secondary | ICD-10-CM

## 2016-01-23 NOTE — Assessment & Plan Note (Signed)
07/16/15 Hgb 11.7 07/23/15 Hgb 11.7 10/07/14 Hgb 10.6

## 2016-01-23 NOTE — Progress Notes (Signed)
Patient ID: Brendan Herring, male   DOB: 10/31/1940, 75 y.o.   MRN: [id number]  Location:  [name] Room Number: 925 [address] Place of Service: clinic [facility] Provider:  Arna SnipeManXie Karine Garn NP   Duane Lopeoss, Alan, MD  Patient Care Team: Gildardo Crankerharles Ross, MD as PCP - General (Family Medicine) Corky CraftsJayadeep S Varanasi, MD as Consulting Physician (Cardiology) Mckinley JewelSara Stoneburner, MD as Consulting Physician (Ophthalmology) Newman PiesSu Teoh, MD as Consulting Physician (Otolaryngology) Michele McalpineScott M Nadel, MD as Consulting Physician (Pulmonary Disease)  Extended Emergency Contact Information Primary Emergency Contact: Vanduyne,Nathan Address: [address]          [place], [place] Home Phone: [phone] Work Phone: [phone] Relation: Son Secondary Emergency Contact: Cobey,Kimberly Address: [address]          [place], [place] [address] [place] Home Phone: [phone] Work Phone: [phone] Mobile Phone: [phone] Relation: Daughter  Code Status:  DNR Goals of care: Advanced Directive information Advanced Directives 01/23/2016  Does patient have an advance directive? Yes  Type of Advance Directive Out of facility DNR (pink MOST or yellow form);Healthcare Power of [place];Living will  Does patient want to make changes to advanced directive? No - Patient declined  Copy of advanced directive(s) in chart? Yes     Chief Complaint  Patient presents with  . Medical Management of Chronic Issues    HPI:  Pt is a 75 y.o. male seen today for medical management of chronic diseases.  Hx of ankle edema, taking Diamox 250mg  daily.  HTN, controlled while on Diamox 250mg , Cardizem 120mg  daily, Lisinopril 40mg . blood sugar, diet controlled, CBG <126 fasting in am in the past, desires CBG once month,   thyroid, taking Levothyroxine 150mcg, last TSH 0.6 10/08/15 COPD, O2, DOE, taking Albuterol q4h prn, Symbicort bid, Prednisone. anxiety, insomnia, are stable, wants to  change Lorazepam bid to prn and Mirtazapine 7.5mg . and constipation, stable while on Miralax. Ambulates with walker, stronger, but apparent DOE noted.     Past Medical History  Diagnosis Date  . Emphysema   . Hypertension   . Coronary artery disease   . Thyroid disease   . Glaucoma   . Hypercholesterolemia   . COPD (chronic obstructive pulmonary disease) (HCC)   . Weak 07/19/2015  . Unstable gait 07/19/2015  . Oxygen dependent 07/19/2015   Past Surgical History  Procedure Laterality Date  . Back surgery  1982  . Cataract extraction  1996/2003    bilateral  . Tonsillectomy  1943  . Wisdom tooth extraction  1977  . Vasectomy  1970  . Coronary angioplasty with stent placement  2009    Allergies  Allergen Reactions  . Benazepril Other (See Comments)    sensitive to sun light  . Benazepril Hcl     REACTION: sensitive to sunlight  . Levofloxacin     REACTION: tendonitis      Medication List       This list is accurate as of: 01/23/16 11:59 PM.  Always use your most recent med list.               acetaZOLAMIDE 250 MG tablet  Commonly known as:  DIAMOX  TAKE 1 TABLET IN THE MORNING.     albuterol 108 (90 Base) MCG/ACT inhaler  Commonly known as:  PROAIR HFA  Inhale 2 puffs into the lungs every 4 (four) hours as needed for shortness of breath.     albuterol (2.5 MG/3ML) 0.083% nebulizer solution  Commonly known as:  PROVENTIL  Take 3 mLs (2.5 mg total) by nebulization 3 (three) times daily. DX J44.1     brimonidine 0.2 % ophthalmic solution  Commonly known as:  ALPHAGAN  Place 1 drop into the left eye 2 (two) times daily.     budesonide-formoterol 160-4.5 MCG/ACT inhaler  Commonly known as:  SYMBICORT  Inhale 2 puffs into the lungs 2 (two) times daily.     CALCIUM 600-D 600-400 MG-UNIT tablet  Generic drug:  Calcium Carbonate-Vitamin D  Take 1 tablet by mouth daily.     cholecalciferol 1000 units tablet  Commonly known as:  VITAMIN D  Take 1,000 Units by  mouth daily.     diltiazem 120 MG 24 hr capsule  Commonly known as:  CARDIZEM CD  Take 120 mg by mouth daily.     lactose free nutrition Liqd  Take 237 mLs by mouth 3 (three) times daily between meals.     latanoprost 0.005 % ophthalmic solution  Commonly known as:  XALATAN  Place 1 drop into both eyes at bedtime.     levothyroxine 150 MCG tablet  Commonly known as:  SYNTHROID, LEVOTHROID  Take 150 mcg by mouth daily before breakfast.     lisinopril 40 MG tablet  Commonly known as:  PRINIVIL,ZESTRIL  Take 40 mg by mouth daily.     LORazepam 0.5 MG tablet  Commonly known as:  ATIVAN  Take one tablet by mouth twice daily     mirtazapine 7.5 MG tablet  Commonly known as:  REMERON  Take 7.5 mg by mouth at bedtime.     polyethylene glycol packet  Commonly known as:  MIRALAX / GLYCOLAX  Take 17 g by mouth daily as needed.     predniSONE 10 MG tablet  Commonly known as:  DELTASONE  Take 10 mg by mouth. Take one tablet by mouth alternating with 5 mg daily     predniSONE 5 MG tablet  Commonly known as:  DELTASONE  Take 5 mg by mouth daily with breakfast. Take one tablet daily alternating with 10 mg     saccharomyces boulardii 250 MG capsule  Commonly known as:  FLORASTOR  Take one tablet by mouth once daily     sodium chloride 0.65 % Soln nasal spray  Commonly known as:  OCEAN  Place 2 sprays into both nostrils as needed for congestion.     Tiotropium Bromide Monohydrate 2.5 MCG/ACT Aers  Commonly known as:  SPIRIVA RESPIMAT  Take 1 puff by mouth daily.        Review of Systems  Constitutional: Negative for fever, appetite change and fatigue.  HENT: Negative for congestion, ear pain, nosebleeds and sore throat.   Eyes: Negative for redness.  Respiratory: Positive for shortness of breath. Negative for wheezing.        DOE  Cardiovascular: Positive for leg swelling. Negative for palpitations.       Ankles  Gastrointestinal: Negative for nausea and vomiting.    Genitourinary: Positive for frequency and flank pain. Negative for dysuria.  Musculoskeletal: Positive for arthralgias and gait problem. Negative for joint swelling.       Ambulates with walker  Skin: Negative for rash.  Neurological: Negative for tremors, weakness and headaches.  Hematological: Does not bruise/bleed easily.  Psychiatric/Behavioral: Negative for confusion and sleep disturbance. The patient is not nervous/anxious.     Immunization History  Administered Date(s) Administered  . Influenza Split 05/16/2012, 06/17/2013  . Influenza Whole 05/17/2009, 04/24/2010,  05/18/2011  . Influenza,inj,Quad PF,36+ Mos 04/20/2014  . Influenza-Unspecified 04/18/2015  . Pneumococcal Polysaccharide-23 05/17/2009, 04/18/2015   Pertinent  Health Maintenance Due  Topic Date Due  . INFLUENZA VACCINE  03/17/2016  . PNA vac Low Risk Adult (2 of 2 - PCV13) 04/17/2016   Fall Risk  11/21/2015  Falls in the past year? No   Functional Status Survey:    Filed Vitals:   01/23/16 1224  BP: 138/78  Pulse: 80  Temp: 98.7 F (37.1 C)  TempSrc: Oral  Resp: 18  Height:  (1.854 m)  Weight: 132 lb 9.6 oz (60.147 kg)   Body mass index is 17.5 kg/(m^2). Physical Exam  Constitutional: He is oriented to person, place, and time. He appears well-developed and well-nourished. No distress.  HENT:  Head: Normocephalic and atraumatic.  Right Ear: External ear normal.  Left Ear: External ear normal.  Nose: Nose normal.  Mouth/Throat: Oropharynx is clear and moist.  Eyes: Conjunctivae and EOM are normal. Pupils are equal, round, and reactive to light. Right eye exhibits no discharge. Left eye exhibits no discharge. No scleral icterus.  Neck: Normal range of motion. Neck supple. No JVD present. No tracheal deviation present. No thyromegaly present.  Cardiovascular: Normal rate, regular rhythm and normal heart sounds.  Exam reveals no gallop and no friction rub.   No murmur heard. Pulmonary/Chest: No  stridor. No respiratory distress. He has no wheezes. He has no rales. He exhibits no tenderness.  Decreased breath sound bilaterally. DOE. O2 [place]  Abdominal: Soft. Bowel sounds are normal. He exhibits no distension and no mass. There is no tenderness. There is no rebound and no guarding. No hernia.  Genitourinary: Guaiac negative stool.  Musculoskeletal: Normal range of motion. He exhibits edema. He exhibits no tenderness.  Trace edema in ankles.   Lymphadenopathy:    He has no cervical adenopathy.  Neurological: He is alert and oriented to person, place, and time. He has normal reflexes. No cranial nerve deficit. He exhibits normal muscle tone. Coordination normal.  Skin: Skin is warm and dry. No rash noted. He is not diaphoretic. No erythema. No pallor.  Psychiatric: He has a normal mood and affect. His behavior is normal. Thought content normal.    Labs reviewed:  Recent Labs  07/10/15 0647 07/11/15 0207 07/12/15 0415 07/16/15 07/23/15 10/08/15  NA 144 145 142 142 143 145  K 3.5 3.8 3.5 4.0 3.6 3.9  CL 102 102 101  --   --   --   CO2 37* 35* 35*  --   --   --   GLUCOSE 181* 226* 233*  --   --   --   BUN 16 24* 32* CREATININE 0.80 0.88 0.88 0.8 1.0 0.9  CALCIUM 9.3 9.4 9.3  --   --   --     Recent Labs  02/26/15 0320 04/10/15 1228 07/10/15 0647 10/08/15  AST 10*  ALT 20 15 14* 7*  ALKPHOS 57 48 62 53  BILITOT 0.2* 0.4 0.5  --   PROT 6.2* 6.8 6.1*  --   ALBUMIN 2.9* 4.2 3.1*  --     Recent Labs  02/25/15 1653  07/10/15 0647 07/10/15 1141 07/10/15 1355 07/11/15 0207 07/12/15 0415 07/16/15 07/23/15 10/08/15  WBC 8.8  < > 12.2* 15.3* 16.1* 9.7 18.5* 11.8 14.3 7.3  NEUTROABS 6.0  --  8.0* 14.4*  --   --   --   --   --   --  HGB 13.1  < > 11.8* 12.4* 12.6* 11.8* 11.2* 11.7* 11.7* 10.6*  HCT 41.4  < > 37.7* 39.6 40.0 37.6* 36.4* 35* 36* 34*  MCV 104.3*  < > 107.4* 107.3* 106.7* 105.9* 107.1*  --   --   --   PLT 224  < > 313 356 366 291 298 259 365  345  < > = values in this interval not displayed. Lab Results  Component Value Date   TSH 0.60 10/08/2015   Lab Results  Component Value Date   HGBA1C 6.2 10/08/2015   No results found for: CHOL, HDL, LDLCALC, LDLDIRECT, TRIG, CHOLHDL  Significant Diagnostic Results in last 30 days:  No results found.  Assessment/Plan  Essential hypertension Controlled, continue Cardizem 120mg , Lisinopril 40mg  daily, Doamox 250mg  daily.  Atrial fibrillation with RVR (HCC) Transient Atrial fibrillation with RVR (HCC): New onset likely due to respiratory failure and tachypnea when in hospital.  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 end stage COPD on 5 L oxygen per [place] at home. On chronic prednisone, 5mg  and 10mg /day alternating dose per the patient's request. Continue Symbicort bid, Albuterol neb tid, Albuterol HFA 2puffs 4hr prn, Tiotropium 1 puff daily.   Constipation Stable, continue MiraLax prn  Hypothyroidism 09/21/14 TSH 12.586, 10/08/15 TSH 0.6, continue  Synthroid daily, update TSH 6 weeks.   Macrocytic anemia 07/16/15 Hgb 11.7 07/23/15 Hgb 11.7 10/07/14 Hgb 10.6  Generalized anxiety disorder 07/17/15 Lorazepam 0.5mg  bid, respiratory distress, Prednisone tapering all contributory Stable. Change Lorazepam 0.5mg  bid prn and Mirtazapine 7.5mg  qhs  Failure to thrive in adult Multiple factorials, mainly end stage COPD contributory, comfort measures, prn Lorazepam and Morphine available to him.    Edema No apparent edema     Family/ staff Communication: continue AL for care needs  Labs/tests ordered: none

## 2016-01-23 NOTE — Assessment & Plan Note (Signed)
09/21/14 TSH 12.586, 10/08/15 TSH 0.6, continue  Synthroid 150mcg daily, update TSH 6 weeks.

## 2016-01-23 NOTE — Assessment & Plan Note (Signed)
No apparent edema 

## 2016-01-23 NOTE — Assessment & Plan Note (Signed)
Transient Atrial fibrillation with RVR (HCC): New onset likely due to respiratory failure and tachypnea when in hospital.  - Currently improved, continue home dose of Cardizem 120 mg daily - Not on anticoagulation, is not candidate for anticoagulation due to severe epistaxis, off ASA too  

## 2016-01-23 NOTE — Assessment & Plan Note (Signed)
Stable, continue  MiraLax prn 

## 2016-01-23 NOTE — Assessment & Plan Note (Signed)
end stage COPD on 5 L oxygen per [facility] at home. On chronic prednisone, 5mg and 10mg/day alternating dose per the patient's request. Continue Symbicort bid, Albuterol neb tid, Albuterol HFA 2puffs 4hr prn, Tiotropium 1 puff daily.   

## 2016-01-23 NOTE — Assessment & Plan Note (Signed)
07/17/15 Lorazepam 0.5mg bid, respiratory distress, Prednisone tapering all contributory Stable. Change Lorazepam 0.5mg bid prn and Mirtazapine 7.5mg qhs  

## 2016-01-23 NOTE — Assessment & Plan Note (Signed)
Multiple factorials, mainly end stage COPD contributory, comfort measures, prn Lorazepam and Morphine available to him.    

## 2016-01-23 NOTE — Assessment & Plan Note (Signed)
Controlled, continue Cardizem 120mg , Lisinopril 40mg  daily, Doamox 250mg  daily.

## 2016-03-11 ENCOUNTER — Encounter: Payer: Self-pay | Admitting: Nurse Practitioner

## 2016-03-11 ENCOUNTER — Non-Acute Institutional Stay: Payer: Medicare Other | Admitting: Nurse Practitioner

## 2016-03-11 DIAGNOSIS — R2681 Unsteadiness on feet: Secondary | ICD-10-CM | POA: Diagnosis not present

## 2016-03-11 DIAGNOSIS — R739 Hyperglycemia, unspecified: Secondary | ICD-10-CM | POA: Diagnosis not present

## 2016-03-11 DIAGNOSIS — D539 Nutritional anemia, unspecified: Secondary | ICD-10-CM

## 2016-03-11 DIAGNOSIS — R0602 Shortness of breath: Secondary | ICD-10-CM | POA: Diagnosis not present

## 2016-03-11 DIAGNOSIS — E039 Hypothyroidism, unspecified: Secondary | ICD-10-CM | POA: Diagnosis not present

## 2016-03-11 DIAGNOSIS — J439 Emphysema, unspecified: Secondary | ICD-10-CM | POA: Diagnosis not present

## 2016-03-11 DIAGNOSIS — I10 Essential (primary) hypertension: Secondary | ICD-10-CM

## 2016-03-11 DIAGNOSIS — I4891 Unspecified atrial fibrillation: Secondary | ICD-10-CM | POA: Diagnosis not present

## 2016-03-11 DIAGNOSIS — F411 Generalized anxiety disorder: Secondary | ICD-10-CM | POA: Diagnosis not present

## 2016-03-11 DIAGNOSIS — R627 Adult failure to thrive: Secondary | ICD-10-CM

## 2016-03-11 DIAGNOSIS — R609 Edema, unspecified: Secondary | ICD-10-CM

## 2016-03-11 DIAGNOSIS — K5900 Constipation, unspecified: Secondary | ICD-10-CM | POA: Diagnosis not present

## 2016-03-11 DIAGNOSIS — T380X5A Adverse effect of glucocorticoids and synthetic analogues, initial encounter: Secondary | ICD-10-CM

## 2016-03-11 NOTE — Assessment & Plan Note (Signed)
09/21/14 TSH 12.586, 10/08/15 TSH 0.6, continue  Synthroid daily, update TSH

## 2016-03-11 NOTE — Progress Notes (Signed)
Location:   [facility] Room Number: [id number] Place of Service:    Provider:  Volanda Mangine    Duane Lope, MD  Patient Care Team: Gildardo Cranker, MD as PCP - General (Family Medicine) [name], MD as Consulting Physician (Cardiology) Mckinley Jewel, MD as Consulting Physician (Ophthalmology) Newman Pies, MD as Consulting Physician (Otolaryngology) Michele Mcalpine, MD as Consulting Physician (Pulmonary Disease)  Extended Emergency Contact Information Primary Emergency Contact: Salls,Nathan Address: [address]          [place], [place] Home Phone: [phone] Work Phone: [phone] Relation: Son Secondary Emergency Contact: Chanda,Kimberly Address: [address]          [place], [place] [address] [place] Home Phone: [phone] Work Phone: [phone] Mobile Phone: [phone] Relation: Daughter  Code Status:  DNR Goals of care: Advanced Directive information Advanced Directives 03/11/2016  Does patient have an advance directive? Yes  Type of Estate agent of [place];Out of facility DNR (pink MOST or yellow form)  Does patient want to make changes to advanced directive? No - Patient declined  Copy of advanced directive(s) in chart? Yes  Pre-existing out of facility DNR order (yellow form or pink MOST form) -     Chief Complaint  Patient presents with  . Acute Visit    increased shortness of breath    HPI:  Pt is a 75 y.o. male seen today for an acute visit for SOB, Hx of COPD, O2 dependent, progressing, no phlegm production, afebrile, denied chests pain or palpitation    Hx of ankle edema, taking Diamox 250mg  daily.  HTN, controlled while on Diamox 250mg , Cardizem 120mg  daily, Lisinopril 40mg . blood sugar, diet controlled, CBG <126 fasting in am in the past, desires CBG once month,   thyroid, taking Levothyroxine , last TSH 0.6 10/08/15 COPD, O2, DOE, taking Albuterol  q4h prn, Symbicort bid, Prednisone. anxiety, insomnia, are stable, wants to change Lorazepam bid to prn and Mirtazapine 7.5mg . and constipation, stable while on Miralax. Ambulates with walker, stronger, but apparent DOE noted.   Past Medical History:  Diagnosis Date  . COPD (chronic obstructive pulmonary disease) (HCC)   . Coronary artery disease   . Emphysema   . Glaucoma   . Hypercholesterolemia   . Hypertension   . 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 Thyroid disease   . Unstable gait 07/19/2015  . Weak 07/19/2015   Past Surgical History:  Procedure Laterality Date  . BACK SURGERY  1982  . CATARACT EXTRACTION  1996/2003   bilateral  . CORONARY ANGIOPLASTY WITH STENT PLACEMENT  2009  . TONSILLECTOMY  1943  . VASECTOMY  1970  . WISDOM TOOTH EXTRACTION  1977    Allergies  Allergen Reactions  . Benazepril Other (See Comments)    sensitive to sun light  . Benazepril Hcl     REACTION: sensitive to sunlight  . Levofloxacin     REACTION: tendonitis      Medication List       Accurate as of 03/11/16  4:59 PM. Always use your most recent med list.          acetaZOLAMIDE 250 MG tablet Commonly known as:  DIAMOX TAKE 1 TABLET IN THE MORNING.   albuterol 108 (90 Base) MCG/ACT inhaler Commonly known as:  PROAIR HFA Inhale 2 puffs into the lungs every 4 (four) hours as needed for shortness of breath.   albuterol (2.5 MG/3ML) 0.083% nebulizer solution Commonly  known as:  PROVENTIL Take 3 mLs (2.5 mg total) by nebulization 3 (three) times daily. DX J44.1   brimonidine 0.2 % ophthalmic solution Commonly known as:  ALPHAGAN Place 1 drop into the left eye 2 (two) times daily.   budesonide-formoterol 160-4.5 MCG/ACT inhaler Commonly known as:  SYMBICORT Inhale 2 puffs into the lungs 2 (two) times daily.   CALCIUM 600-D 600-400 MG-UNIT tablet Generic drug:  Calcium Carbonate-Vitamin D Take 1 tablet by mouth daily.   cholecalciferol 1000 units tablet Commonly known as:   VITAMIN D Take 1,000 Units by mouth daily.   diltiazem 120 MG 24 hr capsule Commonly known as:  CARDIZEM CD Take 120 mg by mouth daily.   lactose free nutrition Liqd Take 237 mLs by mouth 3 (three) times daily between meals.   latanoprost 0.005 % ophthalmic solution Commonly known as:  XALATAN Place 1 drop into both eyes at bedtime.   levothyroxine 150 MCG tablet Commonly known as:  SYNTHROID, LEVOTHROID Take 150 mcg by mouth daily before breakfast.   lisinopril 40 MG tablet Commonly known as:  PRINIVIL,ZESTRIL Take 40 mg by mouth daily.   LORazepam 0.5 MG tablet Commonly known as:  ATIVAN Take one tablet by mouth twice daily   mirtazapine 7.5 MG tablet Commonly known as:  REMERON Take 7.5 mg by mouth at bedtime.   OXYGEN Inhale into the lungs. Give 3.5L to 4 L per minute via nasal cannula.   polyethylene glycol packet Commonly known as:  MIRALAX / GLYCOLAX Take 17 g by mouth daily as needed.   predniSONE 10 MG tablet Commonly known as:  DELTASONE Take 10 mg by mouth. Take one tablet by mouth alternating with 5 mg daily   predniSONE 5 MG tablet Commonly known as:  DELTASONE Take 5 mg by mouth daily with breakfast. Take one tablet daily alternating with 10 mg   saccharomyces boulardii 250 MG capsule Commonly known as:  FLORASTOR Take 250 mg by mouth 2 (two) times daily.   sodium chloride 0.65 % Soln nasal spray Commonly known as:  OCEAN Place 2 sprays into both nostrils as needed for congestion.   Tiotropium Bromide Monohydrate 2.5 MCG/ACT Aers Commonly known as:  SPIRIVA RESPIMAT Take 1 puff by mouth daily.       Review of Systems  Constitutional: Negative for appetite change, fatigue and fever.  HENT: Negative for congestion, ear pain, nosebleeds and sore throat.   Eyes: Negative for redness.  Respiratory: Positive for shortness of breath. Negative for wheezing.        DOE  Cardiovascular: Positive for leg swelling. Negative for palpitations.        Ankles  Gastrointestinal: Negative for nausea and vomiting.  Genitourinary: Positive for flank pain and frequency. Negative for dysuria.  Musculoskeletal: Positive for arthralgias and gait problem. Negative for joint swelling.       Ambulates with walker  Skin: Negative for rash.  Neurological: Negative for tremors, weakness and headaches.  Hematological: Does not bruise/bleed easily.  Psychiatric/Behavioral: Negative for confusion and sleep disturbance. The patient is not nervous/anxious.     Immunization History  Administered Date(s) Administered  . Influenza Split 05/16/2012, 06/17/2013  . Influenza Whole 05/17/2009, 04/24/2010, 05/18/2011  . Influenza,inj,Quad PF,36+ Mos 04/20/2014  . Influenza-Unspecified 04/18/2015  . Pneumococcal Polysaccharide-23 05/17/2009, 04/18/2015   Pertinent  Health Maintenance Due  Topic Date Due  . INFLUENZA VACCINE  03/17/2016  . PNA vac Low Risk Adult (2 of 2 - PCV13) 04/17/2016   Fall Risk  11/21/2015  Falls in the past year? No   Functional Status Survey:    Vitals:   03/11/16 1136  BP: 140/80  Pulse: 74  Resp: 20  Temp: 97.6 F (36.4 C)  Weight: 132 lb 9.6 oz (60.1 kg)  Height: 6\' 1"  (1.854 m)   Body mass index is 17.49 kg/m. Physical Exam  Constitutional: He is oriented to person, place, and time. He appears well-developed and well-nourished. No distress.  HENT:  Head: Normocephalic and atraumatic.  Right Ear: External ear normal.  Left Ear: External ear normal.  Nose: Nose normal.  Mouth/Throat: Oropharynx is clear and moist.  Eyes: Conjunctivae and EOM are normal. Pupils are equal, round, and reactive to light. Right eye exhibits no discharge. Left eye exhibits no discharge. No scleral icterus.  Neck: Normal range of motion. Neck supple. No JVD present. No tracheal deviation present. No thyromegaly present.  Cardiovascular: Normal rate, regular rhythm and normal heart sounds.  Exam reveals no gallop and no friction rub.   No  murmur heard. Pulmonary/Chest: No stridor. No respiratory distress. He has no wheezes. He has no rales. He exhibits no tenderness.  Decreased breath sound bilaterally. DOE. O2 [place]  Abdominal: Soft. Bowel sounds are normal. He exhibits no distension and no mass. There is no tenderness. There is no rebound and no guarding. No hernia.  Genitourinary: Rectal exam shows guaiac negative stool.  Musculoskeletal: Normal range of motion. He exhibits edema. He exhibits no tenderness.  Trace edema in ankles.   Lymphadenopathy:    He has no cervical adenopathy.  Neurological: He is alert and oriented to person, place, and time. He has normal reflexes. No cranial nerve deficit. He exhibits normal muscle tone. Coordination normal.  Skin: Skin is warm and dry. No rash noted. He is not diaphoretic. No erythema. No pallor.  Psychiatric: He has a normal mood and affect. His behavior is normal. Thought content normal.    Labs reviewed:  Recent Labs  07/10/15 0647 07/11/15 0207 07/12/15 0415 07/16/15 07/23/15 10/08/15  NA 144 145 142 142 143 145  K 3.5 3.8 3.5 4.0 3.6 3.9  CL 102 102 101  --   --   --   CO2 37* 35* 35*  --   --   --   GLUCOSE 181* 226* 233*  --   --   --   BUN 16 24* 32* 17 20 21   CREATININE 0.80 0.88 0.88 0.8 1.0 0.9  CALCIUM 9.3 9.4 9.3  --   --   --     Recent Labs  04/10/15 1228 07/10/15 0647 10/08/15  AST 14 15 10*  ALT 15 14* 7*  ALKPHOS 48 62 53  BILITOT 0.4 0.5  --   PROT 6.8 6.1*  --   ALBUMIN 4.2 3.1*  --     Recent Labs  07/10/15 0647 07/10/15 1141 07/10/15 1355 07/11/15 0207 07/12/15 0415 07/16/15 07/23/15 10/08/15  WBC 12.2* 15.3* 16.1* 9.7 18.5* 11.8 14.3 7.3  NEUTROABS 8.0* 14.4*  --   --   --   --   --   --   HGB 11.8* 12.4* 12.6* 11.8* 11.2* 11.7* 11.7* 10.6*  HCT 37.7* 39.6 40.0 37.6* 36.4* 35* 36* 34*  MCV 107.4* 107.3* 106.7* 105.9* 107.1*  --   --   --   PLT 313 356 366 291 298 259 365 345   Lab Results  Component Value Date   TSH 1.09  01/02/2016   Lab Results  Component Value  Date   HGBA1C 6.2 10/08/2015   No results found for: CHOL, HDL, LDLCALC, LDLDIRECT, TRIG, CHOLHDL  Significant Diagnostic Results in last 30 days:  No results found.  Assessment/Plan There are no diagnoses linked to this encounter. Essential hypertension Controlled, continue Cardizem , Lisinopril  daily, Doamox  daily. Update CMP  Atrial fibrillation with RVR (HCC) Transient Atrial fibrillation with RVR (HCC): New onset likely due to respiratory failure and tachypnea when in hospital.  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 end stage COPD on 5 L oxygen per [place] at home. On chronic prednisone,  and /day alternating dose per the patient's request. Continue Symbicort bid, Albuterol neb tid, Albuterol HFA 2puffs 4hr prn, Tiotropium 1 puff daily.    Constipation Stable, continue MiraLax prn  Hypothyroidism 09/21/14 TSH 12.586, 10/08/15 TSH 0.6, continue  Synthroid daily, update TSH   Steroid-induced hyperglycemia 09/21/14 TSH 12.586, 10/08/15 TSH 0.6, continue  Synthroid daily, update TSH 6 weeks.  Update Hgb A1c  Macrocytic anemia Stable, 10/08/15 Hgb 10.6, update CBC  Generalized anxiety disorder 07/17/15 Lorazepam 0.5mg  bid, respiratory distress, Prednisone tapering all contributory Stable. Change Lorazepam 0.5mg  bid prn and Mirtazapine 7.5mg  qhs   Shortness of breath COPD contributory, obtain CXR, update CBC, CMP, BNP  Unstable gait Ambulates with walker, AL for care assistance.   Edema No apparent edema  Failure to thrive in adult Multiple factorials, mainly end stage COPD contributory, comfort measures, prn Lorazepam and Morphine available to him.        Family/ staff Communication:  DNR  Labs/tests ordered:  CBC, CMP, TSH, BNP, Hgb A1c, CXR

## 2016-03-11 NOTE — Assessment & Plan Note (Signed)
Multiple factorials, mainly end stage COPD contributory, comfort measures, prn Lorazepam and Morphine available to him.

## 2016-03-11 NOTE — Assessment & Plan Note (Signed)
Stable, 10/08/15 Hgb 10.6, update CBC

## 2016-03-11 NOTE — Assessment & Plan Note (Signed)
07/17/15 Lorazepam 0.5mg  bid, respiratory distress, Prednisone tapering all contributory Stable. Change Lorazepam 0.5mg  bid prn and Mirtazapine 7.5mg  qhs

## 2016-03-11 NOTE — Assessment & Plan Note (Signed)
Stable, continue  MiraLax prn 

## 2016-03-11 NOTE — Assessment & Plan Note (Signed)
09/21/14 TSH 12.586, 10/08/15 TSH 0.6, continue  Synthroid daily, update TSH 6 weeks.  Update Hgb A1c

## 2016-03-11 NOTE — Assessment & Plan Note (Signed)
Controlled, continue Cardizem 120mg , Lisinopril 40mg  daily, Doamox 250mg  daily. Update CMP

## 2016-03-11 NOTE — Assessment & Plan Note (Signed)
Transient Atrial fibrillation with RVR (HCC): New onset likely due to respiratory failure and tachypnea when in hospital.  - Currently improved, continue home dose of Cardizem 120 mg daily - Not on anticoagulation, is not candidate for anticoagulation due to severe epistaxis, off ASA too

## 2016-03-11 NOTE — Assessment & Plan Note (Signed)
No apparent edema

## 2016-03-11 NOTE — Assessment & Plan Note (Signed)
COPD contributory, obtain CXR, update CBC, CMP, BNP

## 2016-03-11 NOTE — Assessment & Plan Note (Signed)
Ambulates with walker, AL for care assistance.

## 2016-03-11 NOTE — Assessment & Plan Note (Signed)
end stage COPD on 5 L oxygen per  at home. On chronic prednisone, 5mg  and 10mg /day alternating dose per the patient's request. Continue Symbicort bid, Albuterol neb tid, Albuterol HFA 2puffs 4hr prn, Tiotropium 1 puff daily.

## 2016-03-12 LAB — CBC AND DIFFERENTIAL
HEMATOCRIT: 40 % — AB (ref 41–53)
HEMOGLOBIN: 12.7 g/dL — AB (ref 13.5–17.5)
PLATELETS: 243 10*3/uL (ref 150–399)
WBC: 8.1 10^3/mL

## 2016-03-12 LAB — BASIC METABOLIC PANEL
BUN: 21 mg/dL (ref 4–21)
Creatinine: 0.7 mg/dL (ref <=1.3)
Glucose: 200 mg/dL
Potassium: 4.1 mmol/L (ref 3.4–5.3)
Sodium: 144 mmol/L (ref 137–147)

## 2016-03-12 LAB — TSH: TSH: 0.66 u[IU]/mL (ref <=5.90)

## 2016-03-12 LAB — HEPATIC FUNCTION PANEL
ALK PHOS: 62 U/L (ref 25–125)
ALT: 12 U/L (ref 10–40)
AST: 13 U/L — AB (ref 14–40)
BILIRUBIN, TOTAL: 0.4 mg/dL

## 2016-03-12 LAB — HEMOGLOBIN A1C: HEMOGLOBIN A1C: 6.2

## 2016-03-18 ENCOUNTER — Other Ambulatory Visit: Payer: Self-pay | Admitting: *Deleted

## 2016-04-16 ENCOUNTER — Encounter: Payer: Self-pay | Admitting: Internal Medicine

## 2016-04-16 ENCOUNTER — Non-Acute Institutional Stay: Payer: Medicare Other | Admitting: Internal Medicine

## 2016-04-16 DIAGNOSIS — R531 Weakness: Secondary | ICD-10-CM | POA: Diagnosis not present

## 2016-04-16 DIAGNOSIS — Z9981 Dependence on supplemental oxygen: Secondary | ICD-10-CM

## 2016-04-16 DIAGNOSIS — R739 Hyperglycemia, unspecified: Secondary | ICD-10-CM

## 2016-04-16 DIAGNOSIS — J9611 Chronic respiratory failure with hypoxia: Secondary | ICD-10-CM

## 2016-04-16 DIAGNOSIS — T380X5A Adverse effect of glucocorticoids and synthetic analogues, initial encounter: Secondary | ICD-10-CM

## 2016-04-16 DIAGNOSIS — I4891 Unspecified atrial fibrillation: Secondary | ICD-10-CM

## 2016-04-16 DIAGNOSIS — I10 Essential (primary) hypertension: Secondary | ICD-10-CM | POA: Diagnosis not present

## 2016-04-16 DIAGNOSIS — J439 Emphysema, unspecified: Secondary | ICD-10-CM

## 2016-04-16 DIAGNOSIS — D539 Nutritional anemia, unspecified: Secondary | ICD-10-CM | POA: Diagnosis not present

## 2016-04-16 DIAGNOSIS — E039 Hypothyroidism, unspecified: Secondary | ICD-10-CM | POA: Diagnosis not present

## 2016-04-16 NOTE — Progress Notes (Signed)
Progress Note    Location:   [facility] Room Number: [phone] Place of Service:  ALF [phone]) Provider:  Murray Hodgkins, MD  Patient Care Team: Kimber Relic, MD as PCP - General (Internal Medicine) [name], MD as Consulting Physician (Cardiology) Mckinley Jewel, MD as Consulting Physician (Ophthalmology) Newman Pies, MD as Consulting Physician (Otolaryngology) Michele Mcalpine, MD as Consulting Physician (Pulmonary Disease)  Extended Emergency Contact Information Primary Emergency Contact: Mausolf,Nathan Address: [address]          [place], [place] Home Phone: [phone] Work Phone: [phone] Relation: Son Secondary Emergency Contact: Swenor,Kimberly Address: [address]          [place], [place] [address] [place] Home Phone: [phone] Work Phone: [phone] Mobile Phone: [phone] Relation: Daughter  Code Status: DNR Goals of care: Advanced Directive information Advanced Directives 04/16/2016  Does patient have an advance directive? Yes  Type of Estate agent of [place];Out of facility DNR (pink MOST or yellow form)  Does patient want to make changes to advanced directive? -  Copy of advanced directive(s) in chart? Yes  Pre-existing out of facility DNR order (yellow form or pink MOST form) -     Chief Complaint  Patient presents with  . Medical Management of Chronic Issues    routine    HPI:  Pt is a 75 y.o. male seen today for medical management of chronic diseases.    Patient was last seen 03/11/2016 Manxie Mast, NP for increased shortness of breath. Patient has known severe lung disease with COPD and oxygen dependency. He has emphysema. He has been producing small quantities of thick colored phlegm over the last month. This is a change for him. He says his breathing is best in the morning, but it gets worse by midafternoon. Patient is able walk easily with  his walker on supplemental oxygen in the mornings, but finds this quite difficult by midafternoon.  Coronary artery disease-stable and without chest pains or palpitations.  Hypertension controlled.  Hypothyroidism-compensated  Steroid-induced hyperglycemia-recent lab showed glucose as high as 200 mg percent. More recently, it was down TO 130 mg percent.  Patient continues with generalized weakness.   Past Medical History:  Diagnosis Date  . COPD (chronic obstructive pulmonary disease) (HCC)   . Coronary artery disease   . Emphysema   . Glaucoma   . Hypercholesterolemia   . Hypertension   . 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 Thyroid disease   . Unstable gait 07/19/2015  . Weak 07/19/2015   Past Surgical History:  Procedure Laterality Date  . BACK SURGERY  1982  . CATARACT EXTRACTION  1996/2003   bilateral  . CORONARY ANGIOPLASTY WITH STENT PLACEMENT  2009  . TONSILLECTOMY  1943  . VASECTOMY  1970  . WISDOM TOOTH EXTRACTION  1977    Allergies  Allergen Reactions  . Benazepril Other (See Comments)    sensitive to sun light  . Benazepril Hcl     REACTION: sensitive to sunlight  . Levofloxacin     REACTION: tendonitis      Medication List       Accurate as of 04/16/16 11:58 AM. Always use your most recent med list.          acetaZOLAMIDE 250 MG tablet Commonly known as:  DIAMOX TAKE 1 TABLET IN THE MORNING.   albuterol 108 (90 Base) MCG/ACT inhaler Commonly known as:  PROAIR HFA Inhale 2  puffs into the lungs every 4 (four) hours as needed for shortness of breath.   albuterol (2.5 MG/3ML) 0.083% nebulizer solution Commonly known as:  PROVENTIL Take 3 mLs (2.5 mg total) by nebulization 3 (three) times daily. DX J44.1   brimonidine 0.2 % ophthalmic solution Commonly known as:  ALPHAGAN Place 1 drop into the left eye 2 (two) times daily.   budesonide-formoterol 160-4.5 MCG/ACT inhaler Commonly known as:  SYMBICORT Inhale 2 puffs into the lungs 2 (two) times  daily.   CALCIUM 600-D 600-400 MG-UNIT tablet Generic drug:  Calcium Carbonate-Vitamin D Take 1 tablet by mouth daily.   cholecalciferol 1000 units tablet Commonly known as:  VITAMIN D Take 1,000 Units by mouth daily.   diltiazem 120 MG 24 hr capsule Commonly known as:  CARDIZEM CD Take 120 mg by mouth daily.   lactose free nutrition Liqd Take 237 mLs by mouth. One 237 ml every day at noon   latanoprost 0.005 % ophthalmic solution Commonly known as:  XALATAN Place 1 drop into both eyes at bedtime.   levothyroxine 150 MCG tablet Commonly known as:  SYNTHROID, LEVOTHROID Take 150 mcg by mouth daily before breakfast.   lisinopril 40 MG tablet Commonly known as:  PRINIVIL,ZESTRIL Take 40 mg by mouth daily.   LORazepam 0.5 MG tablet Commonly known as:  ATIVAN Take one tablet by mouth twice daily   mirtazapine 7.5 MG tablet Commonly known as:  REMERON Take 7.5 mg by mouth at bedtime.   OXYGEN Inhale into the lungs. Give 3.5L to 4 L per minute via nasal cannula.   polyethylene glycol packet Commonly known as:  MIRALAX / GLYCOLAX Take 17 g by mouth daily as needed.   predniSONE 10 MG tablet Commonly known as:  DELTASONE Take 10 mg by mouth. Take one tablet by mouth alternating with 5 mg daily   predniSONE 5 MG tablet Commonly known as:  DELTASONE Take 5 mg by mouth daily with breakfast. Take one tablet daily alternating with 10 mg   saccharomyces boulardii 250 MG capsule Commonly known as:  FLORASTOR Take 250 mg by mouth 2 (two) times daily.   sodium chloride 0.65 % Soln nasal spray Commonly known as:  OCEAN Place 2 sprays into both nostrils as needed for congestion.   Tiotropium Bromide Monohydrate 2.5 MCG/ACT Aers Commonly known as:  SPIRIVA RESPIMAT Take 1 puff by mouth daily.       Review of Systems  Constitutional: Positive for fatigue. Negative for appetite change and fever.  HENT: Negative for congestion, ear pain, nosebleeds and sore throat.     Eyes: Negative for redness.  Respiratory: Positive for shortness of breath. Negative for wheezing.        DOE  Cardiovascular: Positive for leg swelling. Negative for palpitations.       Ankles  Gastrointestinal: Negative for nausea and vomiting.  Genitourinary: Positive for flank pain and frequency. Negative for dysuria.  Musculoskeletal: Positive for arthralgias and gait problem. Negative for joint swelling.       Ambulates with walker  Skin: Negative for rash.  Neurological: Negative for tremors, weakness and headaches.  Hematological: Does not bruise/bleed easily.  Psychiatric/Behavioral: Negative for confusion and sleep disturbance. The patient is not nervous/anxious.     Immunization History  Administered Date(s) Administered  . Influenza Split 05/16/2012, 06/17/2013  . Influenza Whole 05/17/2009, 04/24/2010, 05/18/2011  . Influenza,inj,Quad PF,36+ Mos 04/20/2014  . Influenza-Unspecified 04/18/2015  . Pneumococcal Polysaccharide-23 05/17/2009, 04/18/2015   Pertinent  Health Maintenance Due  Topic Date Due  . INFLUENZA VACCINE  03/17/2016  . PNA vac Low Risk Adult (2 of 2 - PCV13) 04/17/2016   Fall Risk  11/21/2015  Falls in the past year? No   Functional Status Survey:    Vitals:   04/16/16 1147  BP: (!) 145/75  Pulse: 95  Resp: 20  Temp: 98.8 F (37.1 C)  Weight: 138 lb (62.6 kg)  Height: 6\' 1"  (1.854 m)   Body mass index is 18.21 kg/m. Physical Exam  Constitutional: He is oriented to person, place, and time. He appears well-developed and well-nourished. No distress.  HENT:  Head: Normocephalic and atraumatic.  Right Ear: External ear normal.  Left Ear: External ear normal.  Nose: Nose normal.  Mouth/Throat: Oropharynx is clear and moist.  Eyes: Conjunctivae and EOM are normal. Pupils are equal, round, and reactive to light. Right eye exhibits no discharge. Left eye exhibits no discharge. No scleral icterus.  Neck: Normal range of motion. Neck supple. No  JVD present. No tracheal deviation present. No thyromegaly present.  Cardiovascular: Normal rate, regular rhythm and normal heart sounds.  Exam reveals no gallop and no friction rub.   No murmur heard. Pulmonary/Chest: No stridor. No respiratory distress. He has no wheezes. He has no rales. He exhibits no tenderness.  Decreased breath sound bilaterally. DOE. O2 [place]  Abdominal: Soft. Bowel sounds are normal. He exhibits no distension and no mass. There is no tenderness. There is no rebound and no guarding. No hernia.  Genitourinary: Rectal exam shows guaiac negative stool.  Musculoskeletal: Normal range of motion. He exhibits edema. He exhibits no tenderness.  Trace edema in ankles.   Lymphadenopathy:    He has no cervical adenopathy.  Neurological: He is alert and oriented to person, place, and time. He has normal reflexes. No cranial nerve deficit. He exhibits normal muscle tone. Coordination normal.  Skin: Skin is warm and dry. No rash noted. He is not diaphoretic. No erythema. No pallor.  Psychiatric: He has a normal mood and affect. His behavior is normal. Thought content normal.    Labs reviewed:  Recent Labs  07/10/15 0647 07/11/15 0207 07/12/15 0415  07/23/15 10/08/15 03/12/16  NA 144 145 142  < > 143 145 144  K 3.5 3.8 3.5  < > 3.6 3.9 4.1  CL 102 102 101  --   --   --   --   CO2 37* 35* 35*  --   --   --   --   GLUCOSE 181* 226* 233*  --   --   --   --   BUN 16 24* 32*  < > 20 21 21   CREATININE 0.80 0.88 0.88  < > 1.0 0.9 0.7  CALCIUM 9.3 9.4 9.3  --   --   --   --   < > = values in this interval not displayed.  Recent Labs  07/10/15 0647 10/08/15 03/12/16  AST 15 10* 13*  ALT 14* 7* 12  ALKPHOS 62 53 62  BILITOT 0.5  --   --   PROT 6.1*  --   --   ALBUMIN 3.1*  --   --     Recent Labs  07/10/15 0647 07/10/15 1141 07/10/15 1355 07/11/15 0207 07/12/15 0415  07/23/15 10/08/15 03/12/16  WBC 12.2* 15.3* 16.1* 9.7 18.5*  < > 14.3 7.3 8.1  NEUTROABS 8.0* 14.4*   --   --   --   --   --   --   --  HGB 11.8* 12.4* 12.6* 11.8* 11.2*  < > 11.7* 10.6* 12.7*  HCT 37.7* 39.6 40.0 37.6* 36.4*  < > 36* 34* 40*  MCV 107.4* 107.3* 106.7* 105.9* 107.1*  --   --   --   --   PLT 313 356 366 291 298  < > 365 345 243  < > = values in this interval not displayed. Lab Results  Component Value Date   TSH 0.66 03/12/2016   Lab Results  Component Value Date   HGBA1C 6.2 03/12/2016    Assessment/Plan 1. Chronic respiratory failure with hypoxia (HCC) Continue current medications and oxygen.  Patient seems reasonably stable at this time.  2. Pulmonary emphysema, unspecified emphysema type (HCC)  chronic and unchanged leading to chronic respiratory failure  3. Essential hypertension  controlled  4. Atrial fibrillation with RVR (HCC)  controlled rate  5. Hypothyroidism, unspecified hypothyroidism type  compensated  6. Macrocytic anemia  On 03/12/2016 hemoglobin was 12.7 with MCV 100.8.  7. Oxygen dependent Continue O2 stationary and portable  8. Steroid-induced hyperglycemia Continue to monitor glucose periodically. Last hemoglobin A1c on 03/12/2016 was 6.2.  9. Weak Mild improvement in the mornings, but continues week by midafternoon.

## 2016-06-03 ENCOUNTER — Non-Acute Institutional Stay: Payer: Medicare Other | Admitting: Nurse Practitioner

## 2016-06-03 ENCOUNTER — Encounter: Payer: Self-pay | Admitting: Nurse Practitioner

## 2016-06-03 DIAGNOSIS — J9610 Chronic respiratory failure, unspecified whether with hypoxia or hypercapnia: Secondary | ICD-10-CM | POA: Diagnosis not present

## 2016-06-03 DIAGNOSIS — E039 Hypothyroidism, unspecified: Secondary | ICD-10-CM | POA: Diagnosis not present

## 2016-06-03 DIAGNOSIS — D539 Nutritional anemia, unspecified: Secondary | ICD-10-CM

## 2016-06-03 DIAGNOSIS — T380X5A Adverse effect of glucocorticoids and synthetic analogues, initial encounter: Secondary | ICD-10-CM | POA: Diagnosis not present

## 2016-06-03 DIAGNOSIS — R627 Adult failure to thrive: Secondary | ICD-10-CM | POA: Diagnosis not present

## 2016-06-03 DIAGNOSIS — K5900 Constipation, unspecified: Secondary | ICD-10-CM | POA: Diagnosis not present

## 2016-06-03 DIAGNOSIS — I10 Essential (primary) hypertension: Secondary | ICD-10-CM | POA: Diagnosis not present

## 2016-06-03 DIAGNOSIS — I4891 Unspecified atrial fibrillation: Secondary | ICD-10-CM

## 2016-06-03 DIAGNOSIS — F411 Generalized anxiety disorder: Secondary | ICD-10-CM | POA: Diagnosis not present

## 2016-06-03 DIAGNOSIS — R609 Edema, unspecified: Secondary | ICD-10-CM

## 2016-06-03 DIAGNOSIS — J439 Emphysema, unspecified: Secondary | ICD-10-CM

## 2016-06-03 DIAGNOSIS — R739 Hyperglycemia, unspecified: Secondary | ICD-10-CM | POA: Diagnosis not present

## 2016-06-03 NOTE — Assessment & Plan Note (Signed)
end stage COPD on 5 L oxygen per [place] at home. On chronic prednisone, 5mg  and 10mg /day alternating dose, continue Symbicort bid, Albuterol neb tid, Albuterol HFA 2puffs 4hr prn, Tiotropium 1 puff daily.

## 2016-06-03 NOTE — Assessment & Plan Note (Signed)
Continue supportive care

## 2016-06-03 NOTE — Assessment & Plan Note (Signed)
Transient Atrial fibrillation with RVR (HCC): New onset likely due to respiratory failure and tachypnea when in hospital.  - Currently improved, continue Cardizem 120 mg daily - Not on anticoagulation, is not candidate for anticoagulation due to severe epistaxis, off ASA too  

## 2016-06-03 NOTE — Progress Notes (Signed)
Location:  [facility] Room Number: 925 Place of Service:  [phone]) Provider:  Ky Rumple, Manxie NP  Murray Hodgkins, MD  Patient Care Team: Kimber Relic, MD as PCP - General (Internal Medicine) [name], MD as Consulting Physician (Cardiology) Mckinley Jewel, MD as Consulting Physician (Ophthalmology) Newman Pies, MD as Consulting Physician (Otolaryngology) Michele Mcalpine, MD as Consulting Physician (Pulmonary Disease) Raissa Dam Johnney Ou, NP as Nurse Practitioner (Internal Medicine)  Extended Emergency Contact Information Primary Emergency Contact: Bonsall,Nathan Address: [address]          [place], [place] Home Phone: [phone] Work Phone: [phone] Relation: Son Secondary Emergency Contact: Marland,Kimberly Address: [address]          [place], [place] [address] [place] Home Phone: [phone] Work Phone: [phone] Mobile Phone: [phone] Relation: Daughter  Code Status:  Full Code Goals of care: Advanced Directive information Advanced Directives 06/03/2016  Does patient have an advance directive? Yes  Type of Estate agent of [place];Out of facility DNR (pink MOST or yellow form)  Does patient want to make changes to advanced directive? No - Patient declined  Copy of advanced directive(s) in chart? Yes  Pre-existing out of facility DNR order (yellow form or pink MOST form) -     Chief Complaint  Patient presents with  . Medical Management of Chronic Issues    HPI:  Pt is a 75 y.o. male seen today for medical management of chronic diseases.     Hx of ankle edema, only trace seen,  taking Diamox 250mg  daily. HTN, controlled while on Diamox 250mg , Cardizem 120mg  daily, Lisinopril 40mg . blood sugar, diet controlled, CBG <126 fasting in am in the past, desires CBG once month, thyroid, taking Levothyroxine , last TSH 0.66 03/12/16. COPD, O2, DOE, taking Albuterol  q4h prn, Symbicort bid, Prednisone. anxiety, insomnia, are stable, on Lorazepam bid and Mirtazapine 7.5mg . and constipation, stable while on Miralax. Ambulates with walker, stronger, but apparent DOE noted.  Past Medical History:  Diagnosis Date  . COPD (chronic obstructive pulmonary disease) (HCC)   . Coronary artery disease   . Emphysema   . Glaucoma   . Hypercholesterolemia   . Hypertension   . 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 Thyroid disease   . Unstable gait 07/19/2015  . Weak 07/19/2015   Past Surgical History:  Procedure Laterality Date  . BACK SURGERY  1982  . CATARACT EXTRACTION  1996/2003   bilateral  . CORONARY ANGIOPLASTY WITH STENT PLACEMENT  2009  . TONSILLECTOMY  1943  . VASECTOMY  1970  . WISDOM TOOTH EXTRACTION  1977    Allergies  Allergen Reactions  . Benazepril Other (See Comments)    sensitive to sun light  . Benazepril Hcl     REACTION: sensitive to sunlight  . Levofloxacin     REACTION: tendonitis      Medication List       Accurate as of 06/03/16  2:58 PM. Always use your most recent med list.          acetaZOLAMIDE 250 MG tablet Commonly known as:  DIAMOX TAKE 1 TABLET IN THE MORNING.   albuterol 108 (90 Base) MCG/ACT inhaler Commonly known as:  PROAIR HFA Inhale 2 puffs into the lungs every 4 (four) hours as needed for shortness of breath.   albuterol (2.5 MG/3ML) 0.083% nebulizer solution Commonly known as:  PROVENTIL Take 3 mLs (2.5 mg total) by nebulization 3 (  three) times daily. DX J44.1   brimonidine 0.2 % ophthalmic solution Commonly known as:  ALPHAGAN Place 1 drop into the left eye 2 (two) times daily.   budesonide-formoterol 160-4.5 MCG/ACT inhaler Commonly known as:  SYMBICORT Inhale 2 puffs into the lungs 2 (two) times daily.   CALCIUM 600-D 600-400 MG-UNIT tablet Generic drug:  Calcium Carbonate-Vitamin D Take 1 tablet by mouth daily.   cholecalciferol 1000 units tablet Commonly known as:  VITAMIN D Take 1,000 Units  by mouth daily.   diltiazem 120 MG 24 hr capsule Commonly known as:  CARDIZEM CD Take 120 mg by mouth daily.   lactose free nutrition Liqd Take 237 mLs by mouth. One 237 ml every day at noon   latanoprost 0.005 % ophthalmic solution Commonly known as:  XALATAN Place 1 drop into both eyes at bedtime.   levothyroxine 150 MCG tablet Commonly known as:  SYNTHROID, LEVOTHROID Take 150 mcg by mouth daily before breakfast.   lisinopril 40 MG tablet Commonly known as:  PRINIVIL,ZESTRIL Take 40 mg by mouth daily.   LORazepam 0.5 MG tablet Commonly known as:  ATIVAN Take one tablet by mouth twice daily   mirtazapine 7.5 MG tablet Commonly known as:  REMERON Take 7.5 mg by mouth at bedtime.   OXYGEN Inhale into the lungs. Give 3.5L to 4 L per minute via nasal cannula.   polyethylene glycol packet Commonly known as:  MIRALAX / GLYCOLAX Take 17 g by mouth daily as needed.   predniSONE 10 MG tablet Commonly known as:  DELTASONE Take 10 mg by mouth. Take one tablet by mouth alternating with 5 mg daily   predniSONE 5 MG tablet Commonly known as:  DELTASONE Take 5 mg by mouth daily with breakfast. Take one tablet daily alternating with 10 mg   saccharomyces boulardii 250 MG capsule Commonly known as:  FLORASTOR Take 250 mg by mouth 2 (two) times daily.   sodium chloride 0.65 % Soln nasal spray Commonly known as:  OCEAN Place 2 sprays into both nostrils as needed for congestion.   Tiotropium Bromide Monohydrate 2.5 MCG/ACT Aers Commonly known as:  SPIRIVA RESPIMAT Take 1 puff by mouth daily.       Review of Systems  Constitutional: Positive for fatigue. Negative for appetite change and fever.  HENT: Negative for congestion, ear pain, nosebleeds and sore throat.   Eyes: Negative for redness.  Respiratory: Positive for shortness of breath. Negative for wheezing.        DOE  Cardiovascular: Positive for leg swelling. Negative for palpitations.       Ankles    Gastrointestinal: Negative for nausea and vomiting.  Genitourinary: Positive for frequency. Negative for dysuria and flank pain.  Musculoskeletal: Positive for arthralgias and gait problem. Negative for joint swelling.       Ambulates with walker  Skin: Negative for rash.  Neurological: Negative for tremors, weakness and headaches.  Hematological: Does not bruise/bleed easily.  Psychiatric/Behavioral: Negative for confusion and sleep disturbance. The patient is not nervous/anxious.     Immunization History  Administered Date(s) Administered  . Influenza Split 05/16/2012, 06/17/2013  . Influenza Whole 05/17/2009, 04/24/2010, 05/18/2011  . Influenza,inj,Quad PF,36+ Mos 04/20/2014  . Influenza-Unspecified 04/18/2015  . Pneumococcal Polysaccharide-23 05/17/2009, 04/18/2015   Pertinent  Health Maintenance Due  Topic Date Due  . INFLUENZA VACCINE  03/17/2016  . PNA vac Low Risk Adult (2 of 2 - PCV13) 04/17/2016   Fall Risk  11/21/2015  Falls in the past year? No  Functional Status Survey:    Vitals:   06/03/16 1112  BP: (!) 156/75  Pulse: 92  Resp: 18  Temp: 98.7 F (37.1 C)  Weight: 137 lb 3.2 oz (62.2 kg)  Height: 6\' 1"  (1.854 m)   Body mass index is 18.1 kg/m. Physical Exam  Constitutional: He is oriented to person, place, and time. He appears well-developed and well-nourished. No distress.  HENT:  Head: Normocephalic and atraumatic.  Right Ear: External ear normal.  Left Ear: External ear normal.  Nose: Nose normal.  Mouth/Throat: Oropharynx is clear and moist.  Eyes: Conjunctivae and EOM are normal. Pupils are equal, round, and reactive to light. Right eye exhibits no discharge. Left eye exhibits no discharge. No scleral icterus.  Neck: Normal range of motion. Neck supple. No JVD present. No tracheal deviation present. No thyromegaly present.  Cardiovascular: Normal rate, regular rhythm and normal heart sounds.  Exam reveals no gallop and no friction rub.   No  murmur heard. Pulmonary/Chest: No stridor. No respiratory distress. He has no wheezes. He has no rales. He exhibits no tenderness.  Decreased breath sound bilaterally. DOE. O2 [place]  Abdominal: Soft. Bowel sounds are normal. He exhibits no distension and no mass. There is no tenderness. There is no rebound and no guarding. No hernia.  Genitourinary: Rectal exam shows guaiac negative stool.  Musculoskeletal: Normal range of motion. He exhibits edema. He exhibits no tenderness.  Trace edema in ankles.   Lymphadenopathy:    He has no cervical adenopathy.  Neurological: He is alert and oriented to person, place, and time. He has normal reflexes. No cranial nerve deficit. He exhibits normal muscle tone. Coordination normal.  Skin: Skin is warm and dry. No rash noted. He is not diaphoretic. No erythema. No pallor.  Psychiatric: He has a normal mood and affect. His behavior is normal. Thought content normal.    Labs reviewed:  Recent Labs  07/10/15 0647 07/11/15 0207 07/12/15 0415  07/23/15 10/08/15 03/12/16  NA 144 145 142  < > 143 145 144  K 3.5 3.8 3.5  < > 3.6 3.9 4.1  CL 102 102 101  --   --   --   --   CO2 37* 35* 35*  --   --   --   --   GLUCOSE 181* 226* 233*  --   --   --   --   BUN 16 24* 32*  < > 20 21 21   CREATININE 0.80 0.88 0.88  < > 1.0 0.9 0.7  CALCIUM 9.3 9.4 9.3  --   --   --   --   < > = values in this interval not displayed.  Recent Labs  07/10/15 0647 10/08/15 03/12/16  AST 15 10* 13*  ALT 14* 7* 12  ALKPHOS 62 53 62  BILITOT 0.5  --   --   PROT 6.1*  --   --   ALBUMIN 3.1*  --   --     Recent Labs  07/10/15 0647 07/10/15 1141 07/10/15 1355 07/11/15 0207 07/12/15 0415  07/23/15 10/08/15 03/12/16  WBC 12.2* 15.3* 16.1* 9.7 18.5*  < > 14.3 7.3 8.1  NEUTROABS 8.0* 14.4*  --   --   --   --   --   --   --   HGB 11.8* 12.4* 12.6* 11.8* 11.2*  < > 11.7* 10.6* 12.7*  HCT 37.7* 39.6 40.0 37.6* 36.4*  < > 36* 34* 40*  MCV 107.4* 107.3* 106.7*  105.9* 107.1*  --    --   --   --   PLT 313 356 366 291 298  < > 365 345 243  < > = values in this interval not displayed. Lab Results  Component Value Date   TSH 0.66 03/12/2016   Lab Results  Component Value Date   HGBA1C 6.2 03/12/2016   No results found for: CHOL, HDL, LDLCALC, LDLDIRECT, TRIG, CHOLHDL  Significant Diagnostic Results in last 30 days:  No results found.  Assessment/Plan Essential hypertension Controlled, continue Cardizem 120mg , Lisinopril 40mg  daily, Doamox 250mg  daily, 03/12/16 Hgb 12.7, Na 144, K 4.1, Bun 21, creat 0.65, TSH 0.66, A1c 6.2, BNP 99.9   Atrial fibrillation with RVR (HCC) Transient Atrial fibrillation with RVR (HCC): New onset likely due to respiratory failure and tachypnea when in hospital.  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 end stage COPD on 5 L oxygen per [place] at home. On chronic prednisone, 5mg  and 10mg /day alternating dose, continue Symbicort bid, Albuterol neb tid, Albuterol HFA 2puffs 4hr prn, Tiotropium 1 puff daily.     Chronic respiratory failure (HCC) Chronic, O2 dependent, SOB at rest.   Constipation Stable, continue MiraLax prn  Hypothyroidism 09/21/14 TSH 12.586, 10/08/15 TSH 0.6, 03/12/16 TSH 0.66, continue  Synthroid 150mcg   Steroid-induced hyperglycemia Controlled, 03/12/16 Hgb a1c 6.2  Macrocytic anemia Stable, 03/12/16 Hgb 12.7  Generalized anxiety disorder Stable, continue Lorazepam 0.5mg  bid and Mirtazapine 7.5mg  qhs    Failure to thrive in adult Continue supportive care  Edema Trace edema in ankles, BNP 99.9 03/12/16, continue Diamox.      Family/ staff Communication: continue AL for care assistance.   Labs/tests ordered:  none

## 2016-06-03 NOTE — Assessment & Plan Note (Signed)
Chronic, O2 dependent, SOB at rest.

## 2016-06-03 NOTE — Assessment & Plan Note (Addendum)
Trace edema in ankles, BNP 99.9 03/12/16, continue Diamox.

## 2016-06-03 NOTE — Assessment & Plan Note (Signed)
Stable, continue Lorazepam 0.5mg bid and Mirtazapine 7.5mg qhs    

## 2016-06-03 NOTE — Assessment & Plan Note (Addendum)
Controlled, continue Cardizem 120mg, Lisinopril 40mg daily, Doamox 250mg daily, 03/12/16 Hgb 12.7, Na 144, K 4.1, Bun 21, creat 0.65, TSH 0.66, A1c 6.2, BNP 99.9  

## 2016-06-03 NOTE — Assessment & Plan Note (Signed)
Stable, 03/12/16 Hgb 12.7

## 2016-06-03 NOTE — Assessment & Plan Note (Signed)
09/21/14 TSH 12.586, 10/08/15 TSH 0.6, 03/12/16 TSH 0.66, continue  Synthroid 150mcg

## 2016-06-03 NOTE — Assessment & Plan Note (Signed)
Controlled, 03/12/16 Hgb a1c 6.2

## 2016-06-03 NOTE — Assessment & Plan Note (Signed)
Stable, continue  MiraLax prn 

## 2016-07-16 ENCOUNTER — Non-Acute Institutional Stay: Payer: Medicare Other | Admitting: Nurse Practitioner

## 2016-07-16 ENCOUNTER — Encounter: Payer: Self-pay | Admitting: Nurse Practitioner

## 2016-07-16 DIAGNOSIS — K5900 Constipation, unspecified: Secondary | ICD-10-CM | POA: Diagnosis not present

## 2016-07-16 DIAGNOSIS — R739 Hyperglycemia, unspecified: Secondary | ICD-10-CM

## 2016-07-16 DIAGNOSIS — T380X5A Adverse effect of glucocorticoids and synthetic analogues, initial encounter: Secondary | ICD-10-CM | POA: Diagnosis not present

## 2016-07-16 DIAGNOSIS — J430 Unilateral pulmonary emphysema [MacLeod's syndrome]: Secondary | ICD-10-CM | POA: Diagnosis not present

## 2016-07-16 DIAGNOSIS — F411 Generalized anxiety disorder: Secondary | ICD-10-CM

## 2016-07-16 DIAGNOSIS — I10 Essential (primary) hypertension: Secondary | ICD-10-CM

## 2016-07-16 DIAGNOSIS — E039 Hypothyroidism, unspecified: Secondary | ICD-10-CM | POA: Diagnosis not present

## 2016-07-16 DIAGNOSIS — R634 Abnormal weight loss: Secondary | ICD-10-CM

## 2016-07-16 DIAGNOSIS — R609 Edema, unspecified: Secondary | ICD-10-CM | POA: Diagnosis not present

## 2016-07-16 DIAGNOSIS — D539 Nutritional anemia, unspecified: Secondary | ICD-10-CM | POA: Diagnosis not present

## 2016-07-16 DIAGNOSIS — R627 Adult failure to thrive: Secondary | ICD-10-CM

## 2016-07-16 DIAGNOSIS — I4891 Unspecified atrial fibrillation: Secondary | ICD-10-CM | POA: Diagnosis not present

## 2016-07-16 NOTE — Assessment & Plan Note (Signed)
Stable, Hgb 12.7 03/12/16

## 2016-07-16 NOTE — Assessment & Plan Note (Signed)
TSH 0.6, 03/12/16 TSH 0.66, continue  Synthroid 150mcg

## 2016-07-16 NOTE — Assessment & Plan Note (Signed)
Not apparent. BNP 99.9 03/12/16, continue Diamox.

## 2016-07-16 NOTE — Assessment & Plan Note (Signed)
Stable, continue  MiraLax prn 

## 2016-07-16 NOTE — Assessment & Plan Note (Signed)
Diet controlled.  

## 2016-07-16 NOTE — Assessment & Plan Note (Signed)
Controlled, continue Cardizem 120mg , Lisinopril 40mg  daily, Doamox 250mg  daily, 03/12/16 Hgb 12.7, Na 144, K 4.1, Bun 21, creat 0.65, TSH 0.66, A1c 6.2, BNP 99.9

## 2016-07-16 NOTE — Assessment & Plan Note (Addendum)
Weights are stable, SOB at rest. Continue supportive care

## 2016-07-16 NOTE — Assessment & Plan Note (Signed)
end stage COPD on 5 L oxygen per [place] at home. On chronic prednisone, 5mg  and 10mg /day alternating dose, continue Symbicort bid, Albuterol neb tid, Albuterol HFA 2puffs 4hr prn, Tiotropium 1 puff daily.  Apparent SOB, cant complete a sentence without a break.

## 2016-07-16 NOTE — Assessment & Plan Note (Signed)
Stabilized.  

## 2016-07-16 NOTE — Assessment & Plan Note (Signed)
Transient Atrial fibrillation with RVR (HCC): New onset likely due to respiratory failure and tachypnea when in hospital.  - Currently improved, continue Cardizem 120 mg daily - Not on anticoagulation, is not candidate for anticoagulation due to severe epistaxis, off ASA too

## 2016-07-16 NOTE — Assessment & Plan Note (Signed)
Stable, continue Lorazepam 0.5mg  bid and Mirtazapine 7.5mg  qhs

## 2016-07-16 NOTE — Progress Notes (Signed)
Location:  [facility] Room Number: [id number] Place of Service:  ALF [phone]) Provider:  Jacklyne Baik,Manxie  NP  Murray Hodgkins, MD  Patient Care Team: Kimber Relic, MD as PCP - General (Internal Medicine) [name], MD as Consulting Physician (Cardiology) Mckinley Jewel, MD as Consulting Physician (Ophthalmology) Newman Pies, MD as Consulting Physician (Otolaryngology) Michele Mcalpine, MD as Consulting Physician (Pulmonary Disease) Hannah Strader Johnney Ou, NP as Nurse Practitioner (Internal Medicine)  Extended Emergency Contact Information Primary Emergency Contact: Reisner,Nathan Address: [address]          [place], [place] Home Phone: [phone] Work Phone: [phone] Relation: Son Secondary Emergency Contact: Kindall,Kimberly Address: [address]          [place], [place] [address] [place] Home Phone: [phone] Work Phone: [phone] Mobile Phone: [phone] Relation: Daughter  Code Status:  DNR Goals of care: Advanced Directive information Advanced Directives 07/16/2016  Does Patient Have a Medical Advance Directive? Yes  Type of Estate agent of [place];Out of facility DNR (pink MOST or yellow form)  Does patient want to make changes to medical advance directive? No - Patient declined  Copy of Healthcare Power of Attorney in Chart? Yes  Pre-existing out of facility DNR order (yellow form or pink MOST form) -     Chief Complaint  Patient presents with  . Medical Management of Chronic Issues    HPI:  Pt is a 75 y.o. male seen today for medical management of chronic diseases.     Hx of ankle edema, only trace seen,  taking Diamox 250mg  daily. HTN, controlled while on Diamox 250mg , Cardizem 120mg  daily, Lisinopril 40mg . blood sugar, diet controlled, CBG <126 fasting in am in the past, thyroid, taking Levothyroxine , last TSH 0.66 03/12/16. COPD, O2, DOE, taking Albuterol q4h prn,  Symbicort bid, Prednisone. anxiety, insomnia, are stable, on Lorazepam bid and Mirtazapine 7.5mg . and constipation, stable while on Miralax. Ambulates with walker, stronger, but apparent SOB at rest, cant complete a whole sentence without taking a break.  Past Medical History:  Diagnosis Date  . COPD (chronic obstructive pulmonary disease) (HCC)   . Coronary artery disease   . Emphysema   . Glaucoma   . Hypercholesterolemia   . Hypertension   . 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 Thyroid disease   . Unstable gait 07/19/2015  . Weak 07/19/2015   Past Surgical History:  Procedure Laterality Date  . BACK SURGERY  1982  . CATARACT EXTRACTION  1996/2003   bilateral  . CORONARY ANGIOPLASTY WITH STENT PLACEMENT  2009  . TONSILLECTOMY  1943  . VASECTOMY  1970  . WISDOM TOOTH EXTRACTION  1977    Allergies  Allergen Reactions  . Benazepril Other (See Comments)    sensitive to sun light  . Benazepril Hcl     REACTION: sensitive to sunlight  . Levofloxacin     REACTION: tendonitis      Medication List       Accurate as of 07/16/16 12:43 PM. Always use your most recent med list.          acetaZOLAMIDE 250 MG tablet Commonly known as:  DIAMOX TAKE 1 TABLET IN THE MORNING.   albuterol 108 (90 Base) MCG/ACT inhaler Commonly known as:  PROAIR HFA Inhale 2 puffs into the lungs every 4 (four) hours as needed for shortness of breath.   albuterol (2.5 MG/3ML) 0.083% nebulizer solution Commonly known as:  PROVENTIL Take  3 mLs (2.5 mg total) by nebulization 3 (three) times daily. DX J44.1   brimonidine 0.2 % ophthalmic solution Commonly known as:  ALPHAGAN Place 1 drop into the left eye 2 (two) times daily.   budesonide-formoterol 160-4.5 MCG/ACT inhaler Commonly known as:  SYMBICORT Inhale 2 puffs into the lungs 2 (two) times daily.   CALCIUM 600-D 600-400 MG-UNIT tablet Generic drug:  Calcium Carbonate-Vitamin D Take 1 tablet by mouth daily.   cholecalciferol 1000 units  tablet Commonly known as:  VITAMIN D Take 1,000 Units by mouth daily.   diltiazem 120 MG 24 hr capsule Commonly known as:  CARDIZEM CD Take 120 mg by mouth daily.   lactose free nutrition Liqd Take 237 mLs by mouth. One 237 ml every day at noon   latanoprost 0.005 % ophthalmic solution Commonly known as:  XALATAN Place 1 drop into both eyes at bedtime.   levothyroxine 150 MCG tablet Commonly known as:  SYNTHROID, LEVOTHROID Take 150 mcg by mouth daily before breakfast.   lisinopril 40 MG tablet Commonly known as:  PRINIVIL,ZESTRIL Take 40 mg by mouth daily.   LORazepam 0.5 MG tablet Commonly known as:  ATIVAN Take one tablet by mouth twice daily   mirtazapine 7.5 MG tablet Commonly known as:  REMERON Take 7.5 mg by mouth at bedtime.   OXYGEN Inhale into the lungs. Give 3.5L to 4 L per minute via nasal cannula.   polyethylene glycol packet Commonly known as:  MIRALAX / GLYCOLAX Take 17 g by mouth daily as needed.   predniSONE 10 MG tablet Commonly known as:  DELTASONE Take 10 mg by mouth. Take one tablet by mouth alternating with 5 mg daily   predniSONE 5 MG tablet Commonly known as:  DELTASONE Take 5 mg by mouth daily with breakfast. Take one tablet daily alternating with 10 mg   saccharomyces boulardii 250 MG capsule Commonly known as:  FLORASTOR Take 250 mg by mouth 2 (two) times daily.   sodium chloride 0.65 % Soln nasal spray Commonly known as:  OCEAN Place 2 sprays into both nostrils as needed for congestion.   Tiotropium Bromide Monohydrate 2.5 MCG/ACT Aers Commonly known as:  SPIRIVA RESPIMAT Take 1 puff by mouth daily.       Review of Systems  Constitutional: Positive for fatigue. Negative for appetite change and fever.  HENT: Negative for congestion, ear pain, nosebleeds and sore throat.   Eyes: Negative for redness.  Respiratory: Positive for shortness of breath. Negative for wheezing.        DOE  Cardiovascular: Positive for leg swelling.  Negative for palpitations.       Ankles  Gastrointestinal: Negative for nausea and vomiting.  Genitourinary: Positive for frequency. Negative for dysuria and flank pain.  Musculoskeletal: Positive for arthralgias and gait problem. Negative for joint swelling.       Ambulates with walker  Skin: Negative for rash.  Neurological: Negative for tremors, weakness and headaches.  Hematological: Does not bruise/bleed easily.  Psychiatric/Behavioral: Negative for confusion and sleep disturbance. The patient is not nervous/anxious.     Immunization History  Administered Date(s) Administered  . Influenza Split 05/16/2012, 06/17/2013  . Influenza Whole 05/17/2009, 04/24/2010, 05/18/2011  . Influenza,inj,Quad PF,36+ Mos 04/20/2014  . Influenza-Unspecified 04/18/2015, 06/03/2016  . Pneumococcal Polysaccharide-23 05/17/2009, 04/18/2015   Pertinent  Health Maintenance Due  Topic Date Due  . PNA vac Low Risk Adult (2 of 2 - PCV13) 04/17/2016  . INFLUENZA VACCINE  Completed   Fall Risk  11/21/2015  Falls in the past year? No   Functional Status Survey:    Vitals:   07/16/16 1149  BP: (!) 142/80  Pulse: 76  Resp: 20  Temp: 98.7 F (37.1 C)  SpO2: 98%  Weight: 137 lb 12.8 oz (62.5 kg)  Height: 6\' 1"  (1.854 m)   Body mass index is 18.18 kg/m. Physical Exam  Constitutional: He is oriented to person, place, and time. He appears well-developed and well-nourished. No distress.  HENT:  Head: Normocephalic and atraumatic.  Right Ear: External ear normal.  Left Ear: External ear normal.  Nose: Nose normal.  Mouth/Throat: Oropharynx is clear and moist.  Eyes: Conjunctivae and EOM are normal. Pupils are equal, round, and reactive to light. Right eye exhibits no discharge. Left eye exhibits no discharge. No scleral icterus.  Neck: Normal range of motion. Neck supple. No JVD present. No tracheal deviation present. No thyromegaly present.  Cardiovascular: Normal rate and regular rhythm.  Exam  reveals no gallop and no friction rub.   No murmur heard. Pulmonary/Chest: No stridor. No respiratory distress. He has no wheezes. He has no rales. He exhibits no tenderness.  Decreased breath sound bilaterally. DOE. O2 [place]  Abdominal: Soft. Bowel sounds are normal. He exhibits no distension and no mass. There is no tenderness. There is no rebound and no guarding. No hernia.  Genitourinary: Rectal exam shows guaiac negative stool.  Musculoskeletal: Normal range of motion. He exhibits edema. He exhibits no tenderness.  Trace edema in ankles.   Lymphadenopathy:    He has no cervical adenopathy.  Neurological: He is alert and oriented to person, place, and time. He has normal reflexes. No cranial nerve deficit. He exhibits normal muscle tone. Coordination normal.  Skin: Skin is warm and dry. No rash noted. He is not diaphoretic. No erythema. No pallor.  Psychiatric: He has a normal mood and affect. His behavior is normal. Thought content normal.    Labs reviewed:  Recent Labs  07/23/15 10/08/15 03/12/16  NA 143 145 144  K 3.6 3.9 4.1  BUN 20 21 21   CREATININE 1.0 0.9 0.7    Recent Labs  10/08/15 03/12/16  AST 10* 13*  ALT 7* 12  ALKPHOS 53 62    Recent Labs  07/23/15 10/08/15 03/12/16  WBC 14.3 7.3 8.1  HGB 11.7* 10.6* 12.7*  HCT 36* 34* 40*  PLT 365 345 243   Lab Results  Component Value Date   TSH 0.66 03/12/2016   Lab Results  Component Value Date   HGBA1C 6.2 03/12/2016   No results found for: CHOL, HDL, LDLCALC, LDLDIRECT, TRIG, CHOLHDL  Significant Diagnostic Results in last 30 days:  No results found.  Assessment/Plan: Essential hypertension Controlled, continue Cardizem 120mg , Lisinopril 40mg  daily, Doamox 250mg  daily, 03/12/16 Hgb 12.7, Na 144, K 4.1, Bun 21, creat 0.65, TSH 0.66, A1c 6.2, BNP 99.9   Atrial fibrillation with RVR (HCC) Transient Atrial fibrillation with RVR (HCC): New onset likely due to respiratory failure and tachypnea when in hospital.    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 end stage COPD on 5 L oxygen per [place] at home. On chronic prednisone, 5mg  and 10mg /day alternating dose, continue Symbicort bid, Albuterol neb tid, Albuterol HFA 2puffs 4hr prn, Tiotropium 1 puff daily.  Apparent SOB, cant complete a sentence without a break.  Constipation Stable, continue MiraLax prn   Hypothyroidism TSH 0.6, 03/12/16 TSH 0.66, continue  Synthroid 150mcg    Steroid-induced hyperglycemia Diet controlled.   Macrocytic anemia Stable, Hgb 12.7 03/12/16  Generalized anxiety disorder Stable, continue Lorazepam 0.5mg  bid and Mirtazapine 7.5mg  qhs     Failure to thrive in adult Weights are stable, SOB at rest. Continue supportive care   Loss of weight Stabilized.   Edema Not apparent. BNP 99.9 03/12/16, continue Diamox.       Family/ staff Communication: AL  Labs/tests ordered:  none

## 2016-08-03 ENCOUNTER — Encounter: Payer: Self-pay | Admitting: Internal Medicine

## 2016-08-03 ENCOUNTER — Non-Acute Institutional Stay: Payer: Medicare Other | Admitting: Internal Medicine

## 2016-08-03 DIAGNOSIS — I4891 Unspecified atrial fibrillation: Secondary | ICD-10-CM | POA: Diagnosis not present

## 2016-08-03 DIAGNOSIS — J9610 Chronic respiratory failure, unspecified whether with hypoxia or hypercapnia: Secondary | ICD-10-CM

## 2016-08-03 DIAGNOSIS — Z9981 Dependence on supplemental oxygen: Secondary | ICD-10-CM

## 2016-08-03 DIAGNOSIS — R609 Edema, unspecified: Secondary | ICD-10-CM | POA: Diagnosis not present

## 2016-08-03 DIAGNOSIS — I10 Essential (primary) hypertension: Secondary | ICD-10-CM | POA: Diagnosis not present

## 2016-08-03 NOTE — Progress Notes (Signed)
Progress Note    Location:  [name] Room Number: [id number] Place of Service:  ALF [phone]) Provider:  Murray HodgkinsArthur Dajour Pierpoint, MD  Patient Care Team: Kimber RelicArthur G Doyce Saling, MD as PCP - General (Internal Medicine) Corky CraftsJayadeep S Varanasi, MD as Consulting Physician (Cardiology) Mckinley JewelSara Stoneburner, MD as Consulting Physician (Ophthalmology) Newman PiesSu Teoh, MD as Consulting Physician (Otolaryngology) Michele McalpineScott M Nadel, MD as Consulting Physician (Pulmonary Disease) Man Johnney OuX Mast, NP as Nurse Practitioner (Internal Medicine)  Extended Emergency Contact Information Primary Emergency Contact: Lawes,Nathan Address: [address]          [place], [place] Home Phone: [phone] Work Phone: [phone] Relation: Son Secondary Emergency Contact: Granholm,Kimberly Address: [address]          [place], [place] [address] [place] Home Phone: [phone] Work Phone: [phone] Mobile Phone: [phone] Relation: DaughterDNR Goals of care: Advanced Directive information Advanced Directives 08/03/2016  Does Patient Have a Medical Advance Directive? Yes  Type of Estate agentAdvance Directive Healthcare Power of [place];Out of facility DNR (pink MOST or yellow form)  Does patient want to make changes to medical advance directive? -  Copy of Healthcare Power of Attorney in Chart? Yes  Pre-existing out of facility DNR order (yellow form or pink MOST form) Yellow form placed in chart (order not valid for inpatient use)     Chief Complaint  Patient presents with  . Medical Management of Chronic Issues    routine visit    HPI:  Pt is a 75 y.o. male seen today for medical management of chronic diseases.    Chronic respiratory failure, unspecified whether with hypoxia or hypercapnia (HCC) - The current medical regimen is effective;  continue present plan and medications.  Atrial fibrillation with RVR (HCC) - paroxysmal. Currently in NSR.  Essential hypertension -  controlled  Oxygen dependent - stable and on continuous O2  Edema, unspecified type - none. On Diamox..     Past Medical History:  Diagnosis Date  . COPD (chronic obstructive pulmonary disease) (HCC)   . Coronary artery disease   . Emphysema   . Glaucoma   . Hypercholesterolemia   . Hypertension   . 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 Thyroid disease   . Unstable gait 07/19/2015  . Weak 07/19/2015   Past Surgical History:  Procedure Laterality Date  . BACK SURGERY  1982  . CATARACT EXTRACTION  1996/2003   bilateral  . CORONARY ANGIOPLASTY WITH STENT PLACEMENT  2009  . TONSILLECTOMY  1943  . VASECTOMY  1970  . WISDOM TOOTH EXTRACTION  1977    Allergies  Allergen Reactions  . Benazepril Other (See Comments)    sensitive to sun light  . Benazepril Hcl     REACTION: sensitive to sunlight  . Levofloxacin     REACTION: tendonitis    Allergies as of 08/03/2016      Reactions   Benazepril Other (See Comments)   sensitive to sun light   Benazepril Hcl    REACTION: sensitive to sunlight   Levofloxacin    REACTION: tendonitis      Medication List       Accurate as of 08/03/16  3:48 PM. Always use your most recent med list.          acetaZOLAMIDE 250 MG tablet Commonly known as:  DIAMOX TAKE 1 TABLET IN THE MORNING.   albuterol 108 (90 Base) MCG/ACT inhaler Commonly known as:  PROAIR HFA Inhale 2 puffs into the  lungs every 4 (four) hours as needed for shortness of breath.   albuterol (2.5 MG/3ML) 0.083% nebulizer solution Commonly known as:  PROVENTIL Take 3 mLs (2.5 mg total) by nebulization 3 (three) times daily. DX J44.1   brimonidine 0.2 % ophthalmic solution Commonly known as:  ALPHAGAN Place 1 drop into the left eye 2 (two) times daily.   budesonide-formoterol 160-4.5 MCG/ACT inhaler Commonly known as:  SYMBICORT Inhale 2 puffs into the lungs 2 (two) times daily.   CALCIUM 600-D 600-400 MG-UNIT tablet Generic drug:  Calcium Carbonate-Vitamin D Take  1 tablet by mouth daily.   cholecalciferol 1000 units tablet Commonly known as:  VITAMIN D Take 1,000 Units by mouth daily.   diltiazem 120 MG 24 hr capsule Commonly known as:  CARDIZEM CD Take 120 mg by mouth daily.   lactose free nutrition Liqd Take 237 mLs by mouth. One 237 ml every day at noon   latanoprost 0.005 % ophthalmic solution Commonly known as:  XALATAN Place 1 drop into both eyes at bedtime.   levothyroxine 150 MCG tablet Commonly known as:  SYNTHROID, LEVOTHROID Take 150 mcg by mouth daily before breakfast.   lisinopril 40 MG tablet Commonly known as:  PRINIVIL,ZESTRIL Take 40 mg by mouth daily.   LORazepam 0.5 MG tablet Commonly known as:  ATIVAN Take one tablet by mouth twice daily   mirtazapine 7.5 MG tablet Commonly known as:  REMERON Take 7.5 mg by mouth at bedtime.   OXYGEN Inhale into the lungs. Give 3.5L to 4 L per minute via nasal cannula.   polyethylene glycol packet Commonly known as:  MIRALAX / GLYCOLAX Take 17 g by mouth daily as needed.   predniSONE 10 MG tablet Commonly known as:  DELTASONE Take 10 mg by mouth. Take one tablet by mouth alternating with 5 mg daily   predniSONE 5 MG tablet Commonly known as:  DELTASONE Take 5 mg by mouth daily with breakfast. Take one tablet daily alternating with 10 mg   saccharomyces boulardii 250 MG capsule Commonly known as:  FLORASTOR Take 250 mg by mouth 2 (two) times daily.   sodium chloride 0.65 % Soln nasal spray Commonly known as:  OCEAN Place 2 sprays into both nostrils as needed for congestion.   Tiotropium Bromide Monohydrate 2.5 MCG/ACT Aers Commonly known as:  SPIRIVA RESPIMAT Take 1 puff by mouth daily.       Review of Systems  Constitutional: Positive for fatigue. Negative for appetite change and fever.  HENT: Negative for congestion, ear pain, nosebleeds and sore throat.   Eyes: Negative for redness.  Respiratory: Positive for shortness of breath. Negative for wheezing.          DOE  Cardiovascular: Negative for palpitations and leg swelling.  Gastrointestinal: Negative for nausea and vomiting.  Genitourinary: Positive for flank pain and frequency. Negative for dysuria.  Musculoskeletal: Positive for arthralgias and gait problem. Negative for joint swelling.       Ambulates with walker  Skin: Negative for rash.  Neurological: Negative for tremors, weakness and headaches.  Hematological: Does not bruise/bleed easily.  Psychiatric/Behavioral: Negative for confusion and sleep disturbance. The patient is not nervous/anxious.     Immunization History  Administered Date(s) Administered  . Influenza Split 05/16/2012, 06/17/2013  . Influenza Whole 05/17/2009, 04/24/2010, 05/18/2011  . Influenza,inj,Quad PF,36+ Mos 04/20/2014  . Influenza-Unspecified 04/18/2015, 06/03/2016  . Pneumococcal Polysaccharide-23 05/17/2009, 04/18/2015   Pertinent  Health Maintenance Due  Topic Date Due  . PNA vac Low Risk Adult (  2 of 2 - PCV13) 04/17/2016  . INFLUENZA VACCINE  Completed   Fall Risk  11/21/2015  Falls in the past year? No    Vitals:   08/03/16 1527  BP: 122/68  Pulse: 74  Resp: 18  Temp: 98.2 F (36.8 C)  SpO2: 96%  Weight: 138 lb 9.6 oz (62.9 kg)  Height: 6\' 1"  (1.854 m)   Body mass index is 18.29 kg/m. Physical Exam  Constitutional: He is oriented to person, place, and time. He appears well-developed and well-nourished. No distress.  HENT:  Head: Normocephalic and atraumatic.  Right Ear: External ear normal.  Left Ear: External ear normal.  Nose: Nose normal.  Mouth/Throat: Oropharynx is clear and moist.  Eyes: Conjunctivae and EOM are normal. Pupils are equal, round, and reactive to light. Right eye exhibits no discharge. Left eye exhibits no discharge. No scleral icterus.  Neck: Normal range of motion. Neck supple. No JVD present. No tracheal deviation present. No thyromegaly present.  Cardiovascular: Normal rate and regular rhythm.  Exam reveals  no gallop and no friction rub.   No murmur heard. Pulmonary/Chest: No stridor. No respiratory distress. He has no wheezes. He has no rales. He exhibits no tenderness.  Decreased breath sound bilaterally. DOE. O2 [place]  Abdominal: Soft. Bowel sounds are normal. He exhibits no distension and no mass. There is no tenderness. There is no rebound and no guarding. No hernia.  Genitourinary: Rectal exam shows guaiac negative stool.  Musculoskeletal: Normal range of motion. He exhibits no edema or tenderness.  Lymphadenopathy:    He has no cervical adenopathy.  Neurological: He is alert and oriented to person, place, and time. He has normal reflexes. No cranial nerve deficit. He exhibits normal muscle tone. Coordination normal.  Skin: Skin is warm and dry. No rash noted. He is not diaphoretic. No erythema. No pallor.  Psychiatric: He has a normal mood and affect. His behavior is normal. Thought content normal.    Labs reviewed:  Recent Labs  10/08/15 03/12/16  NA 145 144  K 3.9 4.1  BUN 21 21  CREATININE 0.9 0.7    Recent Labs  10/08/15 03/12/16  AST 10* 13*  ALT 7* 12  ALKPHOS 53 62    Recent Labs  10/08/15 03/12/16  WBC 7.3 8.1  HGB 10.6* 12.7*  HCT 34* 40*  PLT 345 243   Lab Results  Component Value Date   TSH 0.66 03/12/2016   Lab Results  Component Value Date   HGBA1C 6.2 03/12/2016    Assessment/Plan  1. Chronic respiratory failure, unspecified whether with hypoxia or hypercapnia (HCC) The current medical regimen is effective;  continue present plan and medications.  2. Atrial fibrillation with RVR (HCC) Currently in NSR  3. Essential hypertension The current medical regimen is effective;  continue present plan and medications.  4. Oxygen dependent Continue O2  5. Edema, unspecified type Resolved. Continue Diamox

## 2016-09-02 DIAGNOSIS — J449 Chronic obstructive pulmonary disease, unspecified: Secondary | ICD-10-CM | POA: Diagnosis not present

## 2016-09-08 DIAGNOSIS — J449 Chronic obstructive pulmonary disease, unspecified: Secondary | ICD-10-CM | POA: Diagnosis not present

## 2016-09-10 ENCOUNTER — Encounter: Payer: Self-pay | Admitting: Nurse Practitioner

## 2016-09-10 ENCOUNTER — Non-Acute Institutional Stay: Payer: Medicare Other | Admitting: Nurse Practitioner

## 2016-09-10 DIAGNOSIS — I10 Essential (primary) hypertension: Secondary | ICD-10-CM

## 2016-09-10 DIAGNOSIS — R609 Edema, unspecified: Secondary | ICD-10-CM

## 2016-09-10 DIAGNOSIS — K5900 Constipation, unspecified: Secondary | ICD-10-CM

## 2016-09-10 DIAGNOSIS — J430 Unilateral pulmonary emphysema [MacLeod's syndrome]: Secondary | ICD-10-CM | POA: Diagnosis not present

## 2016-09-10 DIAGNOSIS — F411 Generalized anxiety disorder: Secondary | ICD-10-CM

## 2016-09-10 DIAGNOSIS — I4891 Unspecified atrial fibrillation: Secondary | ICD-10-CM | POA: Diagnosis not present

## 2016-09-10 DIAGNOSIS — J9610 Chronic respiratory failure, unspecified whether with hypoxia or hypercapnia: Secondary | ICD-10-CM

## 2016-09-10 DIAGNOSIS — E039 Hypothyroidism, unspecified: Secondary | ICD-10-CM | POA: Diagnosis not present

## 2016-09-10 NOTE — Assessment & Plan Note (Signed)
Stable, continue  MiraLax prn 

## 2016-09-10 NOTE — Assessment & Plan Note (Signed)
Transient Atrial fibrillation with RVR (HCC): New onset likely due to respiratory failure and tachypnea when in hospital.  - Currently improved, continue Cardizem 120 mg daily - Not on anticoagulation, is not candidate for anticoagulation due to severe epistaxis, off ASA too  

## 2016-09-10 NOTE — Assessment & Plan Note (Addendum)
03/12/16 TSH 0.66, continue  Synthroid 150mcg. Update CBC CMP TSH Hgb a1c

## 2016-09-10 NOTE — Assessment & Plan Note (Signed)
Chronic, O2 dependent, SOB at rest.

## 2016-09-10 NOTE — Assessment & Plan Note (Signed)
Not apparent. BNP 99.9 03/12/16, continue Diamox.   

## 2016-09-10 NOTE — Assessment & Plan Note (Signed)
end stage COPD on 5 L oxygen per [place] at home. On chronic prednisone, 5mg and 10mg/day alternating dose, continue Symbicort bid, Albuterol neb tid, Albuterol HFA 2puffs 4hr prn, Tiotropium 1 puff daily.  Apparent SOB, cant complete a sentence without a break.  

## 2016-09-10 NOTE — Progress Notes (Signed)
[facility]:  [name] Room Number: 925 Place of Service:  ALF [phone]) Provider:  Kyanna Mahrt, Manxie  NP   Murray HodgkinsArthur Green, MD  Patient Care Team: Kimber RelicArthur G Green, MD as PCP - General (Internal Medicine) Corky CraftsJayadeep S Varanasi, MD as Consulting Physician (Cardiology) Mckinley JewelSara Stoneburner, MD as Consulting Physician (Ophthalmology) Newman PiesSu Teoh, MD as Consulting Physician (Otolaryngology) Michele McalpineScott M Nadel, MD as Consulting Physician (Pulmonary Disease) Lourdes Manning Johnney OuX Natalie Leclaire, NP as Nurse Practitioner (Internal Medicine)  Extended Emergency Contact Information Primary Emergency Contact: Braaksma,Nathan Address: [address]          [place], [place] Home Phone: [phone] Work Phone: [phone] Relation: Son Secondary Emergency Contact: Mignogna,Kimberly Address: [address]          [place], [place] [address] [place] Home Phone: [phone] Work Phone: [phone] Mobile Phone: [phone] Relation: Daughter  Code Status:  DNR Goals of care: Advanced Directive information Advanced Directives 09/10/2016  Does Patient Have a Medical Advance Directive? Yes  Type of Estate agentAdvance Directive Healthcare Power of Brooks[place]orney;Out of facility DNR (pink MOST or yellow form)  Does patient want to make changes to medical advance directive? No - Patient declined  Copy of Healthcare Power of Attorney in Chart? Yes  Pre-existing out of facility DNR order (yellow form or pink MOST form) Yellow form placed in chart (order not valid for inpatient use)     Chief Complaint  Patient presents with  . Medical Management of Chronic Issues    HPI:  Pt is a 76 y.o. male seen today for medical management of chronic diseases.     Hx of ankle edema, only trace seen, taking Diamox 250mg  daily. HTN, controlled while on Diamox 250mg , Cardizem 120mg  daily, Lisinopril 40mg . blood sugar, diet controlled, CBG <126 fasting in am in the past, thyroid, taking Levothyroxine 150mcg,  last TSH 0.66 03/12/16.COPD, O2, DOE, taking Albuterol q4h prn, Symbicort bid, Prednisone. anxiety, insomnia, are stable, on Lorazepam bid and Mirtazapine 7.5mg . and constipation, stable while on Miralax. Ambulates with walker, stronger, but apparent SOB at rest, cant complete a whole sentence without taking a break.  Past Medical History:  Diagnosis Date  . COPD (chronic obstructive pulmonary disease) (HCC)   . Coronary artery disease   . Emphysema   . Glaucoma   . Hypercholesterolemia   . Hypertension   . 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 Thyroid disease   . Unstable gait 07/19/2015  . Weak 07/19/2015   Past Surgical History:  Procedure Laterality Date  . BACK SURGERY  1982  . CATARACT EXTRACTION  1996/2003   bilateral  . CORONARY ANGIOPLASTY WITH STENT PLACEMENT  2009  . TONSILLECTOMY  1943  . VASECTOMY  1970  . WISDOM TOOTH EXTRACTION  1977    Allergies  Allergen Reactions  . Benazepril Other (See Comments)    sensitive to sun light  . Benazepril Hcl     REACTION: sensitive to sunlight  . Levofloxacin     REACTION: tendonitis    Allergies as of 09/10/2016      Reactions   Benazepril Other (See Comments)   sensitive to sun light   Benazepril Hcl    REACTION: sensitive to sunlight   Levofloxacin    REACTION: tendonitis      Medication List       Accurate as of 09/10/16 12:16 PM. Always use your most recent med list.          acetaZOLAMIDE 250 MG tablet Commonly  known as:  DIAMOX TAKE 1 TABLET IN THE MORNING.   albuterol 108 (90 Base) MCG/ACT inhaler Commonly known as:  PROAIR HFA Inhale 2 puffs into the lungs every 4 (four) hours as needed for shortness of breath.   albuterol (2.5 MG/3ML) 0.083% nebulizer solution Commonly known as:  PROVENTIL Take 3 mLs (2.5 mg total) by nebulization 3 (three) times daily. DX J44.1   brimonidine 0.2 % ophthalmic solution Commonly known as:  ALPHAGAN Place 1 drop into the left eye 2 (two) times daily.     budesonide-formoterol 160-4.5 MCG/ACT inhaler Commonly known as:  SYMBICORT Inhale 2 puffs into the lungs 2 (two) times daily.   CALCIUM 600-D 600-400 MG-UNIT tablet Generic drug:  Calcium Carbonate-Vitamin D Take 1 tablet by mouth daily.   cholecalciferol 1000 units tablet Commonly known as:  VITAMIN D Take 1,000 Units by mouth daily.   diltiazem 120 MG 24 hr capsule Commonly known as:  CARDIZEM CD Take 120 mg by mouth daily.   lactose free nutrition Liqd Take 237 mLs by mouth. One 237 ml every day at noon   latanoprost 0.005 % ophthalmic solution Commonly known as:  XALATAN Place 1 drop into both eyes at bedtime.   levothyroxine 150 MCG tablet Commonly known as:  SYNTHROID, LEVOTHROID Take 150 mcg by mouth daily before breakfast.   lisinopril 40 MG tablet Commonly known as:  PRINIVIL,ZESTRIL Take 40 mg by mouth daily.   LORazepam 0.5 MG tablet Commonly known as:  ATIVAN Take one tablet by mouth twice daily   mirtazapine 7.5 MG tablet Commonly known as:  REMERON Take 7.5 mg by mouth at bedtime.   OXYGEN Inhale into the lungs. Give 3.5L to 4 L per minute via nasal cannula.   polyethylene glycol packet Commonly known as:  MIRALAX / GLYCOLAX Take 17 g by mouth daily as needed.   predniSONE 10 MG tablet Commonly known as:  DELTASONE Take 10 mg by mouth. Take one tablet by mouth alternating with 5 mg daily   predniSONE 5 MG tablet Commonly known as:  DELTASONE Take 5 mg by mouth daily with breakfast. Take one tablet daily alternating with 10 mg   saccharomyces boulardii 250 MG capsule Commonly known as:  FLORASTOR Take 250 mg by mouth 2 (two) times daily.   sodium chloride 0.65 % Soln nasal spray Commonly known as:  OCEAN Place 2 sprays into both nostrils as needed for congestion.   Tiotropium Bromide Monohydrate 2.5 MCG/ACT Aers Commonly known as:  SPIRIVA RESPIMAT Take 1 puff by mouth daily.       Review of Systems  Constitutional: Positive for  fatigue. Negative for appetite change and fever.  HENT: Negative for congestion, ear pain, nosebleeds and sore throat.   Eyes: Negative for redness.  Respiratory: Positive for shortness of breath. Negative for wheezing.        DOE  Cardiovascular: Negative for palpitations and leg swelling.  Gastrointestinal: Negative for nausea and vomiting.  Genitourinary: Positive for flank pain and frequency. Negative for dysuria.  Musculoskeletal: Positive for arthralgias and gait problem. Negative for joint swelling.       Ambulates with walker  Skin: Negative for rash.  Neurological: Negative for tremors, weakness and headaches.  Hematological: Does not bruise/bleed easily.  Psychiatric/Behavioral: Negative for confusion and sleep disturbance. The patient is not nervous/anxious.     Immunization History  Administered Date(s) Administered  . Influenza Split 05/16/2012, 06/17/2013  . Influenza Whole 05/17/2009, 04/24/2010, 05/18/2011  . Influenza,inj,Quad PF,36+ Mos 04/20/2014  .  Influenza-Unspecified 04/18/2015, 06/03/2016  . Pneumococcal Polysaccharide-23 05/17/2009, 04/18/2015   Pertinent  Health Maintenance Due  Topic Date Due  . PNA vac Low Risk Adult (2 of 2 - PCV13) 04/17/2016  . INFLUENZA VACCINE  Completed   Fall Risk  11/21/2015  Falls in the past year? No   Functional Status Survey:    Vitals:   09/10/16 1023  BP: 138/74  Pulse: 80  Resp: 20  Temp: 97.8 F (36.6 C)  SpO2: 99%  Weight: 135 lb 3.2 oz (61.3 kg)  Height: 6\' 1"  (1.854 m)   Body mass index is 17.84 kg/m. Physical Exam  Constitutional: He is oriented to person, place, and time. He appears well-developed and well-nourished. No distress.  HENT:  Head: Normocephalic and atraumatic.  Right Ear: External ear normal.  Left Ear: External ear normal.  Nose: Nose normal.  Mouth/Throat: Oropharynx is clear and moist.  Eyes: Conjunctivae and EOM are normal. Pupils are equal, round, and reactive to light. Right eye  exhibits no discharge. Left eye exhibits no discharge. No scleral icterus.  Neck: Normal range of motion. Neck supple. No JVD present. No tracheal deviation present. No thyromegaly present.  Cardiovascular: Normal rate and regular rhythm.  Exam reveals no gallop and no friction rub.   No murmur heard. Pulmonary/Chest: No stridor. No respiratory distress. He has no wheezes. He has no rales. He exhibits no tenderness.  Decreased breath sound bilaterally. DOE. O2 [place]  Abdominal: Soft. Bowel sounds are normal. He exhibits no distension and no mass. There is no tenderness. There is no rebound and no guarding. No hernia.  Genitourinary: Rectal exam shows guaiac negative stool.  Musculoskeletal: Normal range of motion. He exhibits no edema or tenderness.  Lymphadenopathy:    He has no cervical adenopathy.  Neurological: He is alert and oriented to person, place, and time. He has normal reflexes. No cranial nerve deficit. He exhibits normal muscle tone. Coordination normal.  Skin: Skin is warm and dry. No rash noted. He is not diaphoretic. No erythema. No pallor.  Psychiatric: He has a normal mood and affect. His behavior is normal. Thought content normal.    Labs reviewed:  Recent Labs  10/08/15 03/12/16  NA 145 144  K 3.9 4.1  BUN 21 21  CREATININE 0.9 0.7    Recent Labs  10/08/15 03/12/16  AST 10* 13*  ALT 7* 12  ALKPHOS 53 62    Recent Labs  10/08/15 03/12/16  WBC 7.3 8.1  HGB 10.6* 12.7*  HCT 34* 40*  PLT 345 243   Lab Results  Component Value Date   TSH 0.66 03/12/2016   Lab Results  Component Value Date   HGBA1C 6.2 03/12/2016   No results found for: CHOL, HDL, LDLCALC, LDLDIRECT, TRIG, CHOLHDL  Significant Diagnostic Results in last 30 days:  No results found.  Assessment/Plan Essential hypertension Controlled, continue Cardizem 120mg , Lisinopril 40mg  daily, Doamox 250mg  daily, 03/12/16 Hgb 12.7, Na 144, K 4.1, Bun 21, creat 0.65, TSH 0.66, A1c 6.2, BNP  99.9   Atrial fibrillation with RVR (HCC) Transient Atrial fibrillation with RVR (HCC): New onset likely due to respiratory failure and tachypnea when in hospital.  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 end stage COPD on 5 L oxygen per [place] at home. On chronic prednisone, 5mg  and 10mg /day alternating  dose, continue Symbicort bid, Albuterol neb tid, Albuterol HFA 2puffs 4hr prn, Tiotropium 1 puff daily.  Apparent SOB, cant complete a sentence without a break.    Chronic respiratory failure (HCC) Chronic, O2 dependent, SOB at rest.   Constipation Stable, continue MiraLax prn   Hypothyroidism 03/12/16 TSH 0.66, continue  Synthroid . Update CBC CMP TSH Hgb a1c    Generalized anxiety disorder Stable, continue Lorazepam 0.5mg  bid and Mirtazapine 7.5mg  qhs     Edema Not apparent. BNP 99.9 03/12/16, continue Diamox.       Family/ staff Communication: AL  Labs/tests ordered:  CBC CMP TSH Hgb a1c

## 2016-09-10 NOTE — Assessment & Plan Note (Signed)
Controlled, continue Cardizem 120mg, Lisinopril 40mg daily, Doamox 250mg daily, 03/12/16 Hgb 12.7, Na 144, K 4.1, Bun 21, creat 0.65, TSH 0.66, A1c 6.2, BNP 99.9  

## 2016-09-10 NOTE — Assessment & Plan Note (Signed)
Stable, continue Lorazepam 0.5mg bid and Mirtazapine 7.5mg qhs    

## 2016-09-15 DIAGNOSIS — E039 Hypothyroidism, unspecified: Secondary | ICD-10-CM | POA: Diagnosis not present

## 2016-09-15 DIAGNOSIS — I10 Essential (primary) hypertension: Secondary | ICD-10-CM | POA: Diagnosis not present

## 2016-09-15 DIAGNOSIS — R739 Hyperglycemia, unspecified: Secondary | ICD-10-CM | POA: Diagnosis not present

## 2016-09-15 LAB — BASIC METABOLIC PANEL
BUN: 24 mg/dL — AB (ref 4–21)
Creatinine: 1 mg/dL (ref <=1.3)
GLUCOSE: 100 mg/dL
Potassium: 3.8 mmol/L (ref 3.4–5.3)
Sodium: 146 mmol/L (ref 137–147)

## 2016-09-15 LAB — HEPATIC FUNCTION PANEL
ALT: 12 U/L (ref 10–40)
AST: 13 U/L — AB (ref 14–40)
Alkaline Phosphatase: 55 U/L (ref 25–125)
Bilirubin, Total: 0.4 mg/dL

## 2016-09-15 LAB — HEMOGLOBIN A1C: HEMOGLOBIN A1C: 5.9

## 2016-09-15 LAB — CBC AND DIFFERENTIAL
HEMATOCRIT: 39 % — AB (ref 41–53)
HEMOGLOBIN: 12.4 g/dL — AB (ref 13.5–17.5)
PLATELETS: 218 10*3/uL (ref 150–399)
WBC: 7.3 10*3/mL

## 2016-09-15 LAB — TSH: TSH: 1.27 u[IU]/mL (ref <=5.90)

## 2016-09-16 ENCOUNTER — Other Ambulatory Visit: Payer: Self-pay | Admitting: *Deleted

## 2016-10-03 DIAGNOSIS — J449 Chronic obstructive pulmonary disease, unspecified: Secondary | ICD-10-CM | POA: Diagnosis not present

## 2016-10-06 DIAGNOSIS — L84 Corns and callosities: Secondary | ICD-10-CM | POA: Diagnosis not present

## 2016-10-06 DIAGNOSIS — M79672 Pain in left foot: Secondary | ICD-10-CM | POA: Diagnosis not present

## 2016-10-06 DIAGNOSIS — L602 Onychogryphosis: Secondary | ICD-10-CM | POA: Diagnosis not present

## 2016-10-06 DIAGNOSIS — M79671 Pain in right foot: Secondary | ICD-10-CM | POA: Diagnosis not present

## 2016-10-09 DIAGNOSIS — J449 Chronic obstructive pulmonary disease, unspecified: Secondary | ICD-10-CM | POA: Diagnosis not present

## 2016-10-14 ENCOUNTER — Encounter: Payer: Self-pay | Admitting: Nurse Practitioner

## 2016-10-14 ENCOUNTER — Non-Acute Institutional Stay: Payer: Medicare Other | Admitting: Nurse Practitioner

## 2016-10-14 DIAGNOSIS — I4891 Unspecified atrial fibrillation: Secondary | ICD-10-CM

## 2016-10-14 DIAGNOSIS — K5900 Constipation, unspecified: Secondary | ICD-10-CM

## 2016-10-14 DIAGNOSIS — E039 Hypothyroidism, unspecified: Secondary | ICD-10-CM | POA: Diagnosis not present

## 2016-10-14 DIAGNOSIS — I10 Essential (primary) hypertension: Secondary | ICD-10-CM | POA: Diagnosis not present

## 2016-10-14 DIAGNOSIS — J430 Unilateral pulmonary emphysema [MacLeod's syndrome]: Secondary | ICD-10-CM

## 2016-10-14 DIAGNOSIS — R609 Edema, unspecified: Secondary | ICD-10-CM

## 2016-10-14 DIAGNOSIS — F411 Generalized anxiety disorder: Secondary | ICD-10-CM | POA: Diagnosis not present

## 2016-10-14 DIAGNOSIS — R627 Adult failure to thrive: Secondary | ICD-10-CM

## 2016-10-14 DIAGNOSIS — D539 Nutritional anemia, unspecified: Secondary | ICD-10-CM

## 2016-10-14 NOTE — Assessment & Plan Note (Addendum)
Controlled, continue Cardizem 120mg , Lisinopril 40mg  daily, Doamox 250mg  daily, 09/16/16 wbc 7.3, Hgb 12.4, plt 218, Na 146, k 3.8, Bun 24, creat 0.99, TP 5.4, albumin 3.4, TSH 1.27, Hgb a1c 5.9.

## 2016-10-14 NOTE — Assessment & Plan Note (Signed)
continue  Synthroid 150mcg. TSH 1.27 09/16/16

## 2016-10-14 NOTE — Assessment & Plan Note (Signed)
Stable, continue  MiraLax prn 

## 2016-10-14 NOTE — Progress Notes (Signed)
Location:  [facility] Room Number: 925 Place of Service:  ALF [phone]) Provider:  Dilan Novosad, Manxie  NP  Murray Hodgkins, MD  Patient Care Team: Kimber Relic, MD as PCP - General (Internal Medicine) [name], MD as Consulting Physician (Cardiology) Mckinley Jewel, MD as Consulting Physician (Ophthalmology) Newman Pies, MD as Consulting Physician (Otolaryngology) Michele Mcalpine, MD as Consulting Physician (Pulmonary Disease) Winifred Balogh Johnney Ou, NP as Nurse Practitioner (Internal Medicine)  Extended Emergency Contact Information Primary Emergency Contact: Irizarry,Nathan Address: [address]          [place], [place] Home Phone: [phone] Work Phone: [phone] Relation: Son Secondary Emergency Contact: Kiang,Kimberly Address: [address]          [place], [place] [address] [place] Home Phone: [phone] Work Phone: [phone] Mobile Phone: [phone] Relation: Daughter  Code Status:  DNR Goals of care: Advanced Directive information Advanced Directives 10/14/2016  Does Patient Have a Medical Advance Directive? Yes  Type of Estate agent of [place];Out of facility DNR (pink MOST or yellow form)  Does patient want to make changes to medical advance directive? No - Patient declined  Copy of Healthcare Power of Attorney in Chart? Yes  Pre-existing out of facility DNR order (yellow form or pink MOST form) Yellow form placed in chart (order not valid for inpatient use)     Chief Complaint  Patient presents with  . Medical Management of Chronic Issues    HPI:  Pt is a 76 y.o. male seen today for medical management of chronic diseases.    Hx of ankle edema, only trace seen, taking Diamox 250mg  daily. HTN, controlled while on Diamox 250mg , Cardizem 120mg  daily, Lisinopril 40mg . blood sugar, diet controlled, CBG <126 fasting in am in the past, thyroid, taking Levothyroxine , last  TSH 1.27 09/15/16. COPD, O2, DOE, taking Albuterol q4h prn, Symbicort bid, Prednisone. anxiety, insomnia, are stable, on Lorazepam bid and Mirtazapine 7.5mg . and constipation, stable while on Miralax. Ambulates with walker, stronger, but apparent SOB at rest, cant complete a whole sentence without taking a break.    Past Medical History:  Diagnosis Date  . COPD (chronic obstructive pulmonary disease) (HCC)   . Coronary artery disease   . Emphysema   . Glaucoma   . Hypercholesterolemia   . Hypertension   . 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 Thyroid disease   . Unstable gait 07/19/2015  . Weak 07/19/2015   Past Surgical History:  Procedure Laterality Date  . BACK SURGERY  1982  . CATARACT EXTRACTION  1996/2003   bilateral  . CORONARY ANGIOPLASTY WITH STENT PLACEMENT  2009  . TONSILLECTOMY  1943  . VASECTOMY  1970  . WISDOM TOOTH EXTRACTION  1977    Allergies  Allergen Reactions  . Benazepril Other (See Comments)    sensitive to sun light  . Benazepril Hcl     REACTION: sensitive to sunlight  . Levofloxacin     REACTION: tendonitis    Allergies as of 10/14/2016      Reactions   Benazepril Other (See Comments)   sensitive to sun light   Benazepril Hcl    REACTION: sensitive to sunlight   Levofloxacin    REACTION: tendonitis      Medication List       Accurate as of 10/14/16  1:03 PM. Always use your most recent med list.          acetaZOLAMIDE 250 MG  tablet Commonly known as:  DIAMOX TAKE 1 TABLET IN THE MORNING.   albuterol 108 (90 Base) MCG/ACT inhaler Commonly known as:  PROAIR HFA Inhale 2 puffs into the lungs every 4 (four) hours as needed for shortness of breath.   albuterol (2.5 MG/3ML) 0.083% nebulizer solution Commonly known as:  PROVENTIL Take 3 mLs (2.5 mg total) by nebulization 3 (three) times daily. DX J44.1   brimonidine 0.2 % ophthalmic solution Commonly known as:  ALPHAGAN Place 1 drop into the left eye 2 (two) times daily.     budesonide-formoterol 160-4.5 MCG/ACT inhaler Commonly known as:  SYMBICORT Inhale 2 puffs into the lungs 2 (two) times daily.   CALCIUM 600-D 600-400 MG-UNIT tablet Generic drug:  Calcium Carbonate-Vitamin D Take 1 tablet by mouth daily.   cholecalciferol 1000 units tablet Commonly known as:  VITAMIN D Take 1,000 Units by mouth daily.   diltiazem 120 MG 24 hr capsule Commonly known as:  CARDIZEM CD Take 120 mg by mouth daily.   lactose free nutrition Liqd Take 237 mLs by mouth. One 237 ml every day at noon   latanoprost 0.005 % ophthalmic solution Commonly known as:  XALATAN Place 1 drop into both eyes at bedtime.   levothyroxine 150 MCG tablet Commonly known as:  SYNTHROID, LEVOTHROID Take 150 mcg by mouth daily before breakfast.   lisinopril 40 MG tablet Commonly known as:  PRINIVIL,ZESTRIL Take 40 mg by mouth daily.   LORazepam 0.5 MG tablet Commonly known as:  ATIVAN Take one tablet by mouth twice daily   mirtazapine 7.5 MG tablet Commonly known as:  REMERON Take 7.5 mg by mouth at bedtime.   OXYGEN Inhale into the lungs. Give 3.5L to 4 L per minute via nasal cannula.   polyethylene glycol packet Commonly known as:  MIRALAX / GLYCOLAX Take 17 g by mouth daily as needed.   predniSONE 10 MG tablet Commonly known as:  DELTASONE Take 10 mg by mouth. Take one tablet by mouth alternating with 5 mg daily   predniSONE 5 MG tablet Commonly known as:  DELTASONE Take 5 mg by mouth daily with breakfast. Take one tablet daily alternating with 10 mg   saccharomyces boulardii 250 MG capsule Commonly known as:  FLORASTOR Take 250 mg by mouth 2 (two) times daily.   sodium chloride 0.65 % Soln nasal spray Commonly known as:  OCEAN Place 2 sprays into both nostrils as needed for congestion.   Tiotropium Bromide Monohydrate 2.5 MCG/ACT Aers Commonly known as:  SPIRIVA RESPIMAT Take 1 puff by mouth daily.       Review of Systems  Constitutional: Positive for  fatigue. Negative for appetite change and fever.  HENT: Negative for congestion, ear pain, nosebleeds and sore throat.   Eyes: Negative for redness.  Respiratory: Positive for shortness of breath. Negative for wheezing.        DOE  Cardiovascular: Negative for palpitations and leg swelling.  Gastrointestinal: Negative for nausea and vomiting.  Genitourinary: Positive for flank pain and frequency. Negative for dysuria.  Musculoskeletal: Positive for arthralgias and gait problem. Negative for joint swelling.       Ambulates with walker  Skin: Negative for rash.  Neurological: Negative for tremors, weakness and headaches.  Hematological: Does not bruise/bleed easily.  Psychiatric/Behavioral: Negative for confusion and sleep disturbance. The patient is not nervous/anxious.     Immunization History  Administered Date(s) Administered  . Influenza Split 05/16/2012, 06/17/2013  . Influenza Whole 05/17/2009, 04/24/2010, 05/18/2011  . Influenza,inj,Quad PF,36+  Mos 04/20/2014  . Influenza-Unspecified 04/18/2015, 06/03/2016  . Pneumococcal Polysaccharide-23 05/17/2009, 04/18/2015   Pertinent  Health Maintenance Due  Topic Date Due  . PNA vac Low Risk Adult (2 of 2 - PCV13) 04/17/2016  . INFLUENZA VACCINE  Completed   Fall Risk  11/21/2015  Falls in the past year? No   Functional Status Survey:    Vitals:   10/14/16 1019  BP: 120/68  Pulse: 68  Resp: 18  Temp: 97.7 F (36.5 C)  SpO2: 99%  Weight: 137 lb 3.2 oz (62.2 kg)  Height: 6\' 1"  (1.854 m)   Body mass index is 18.1 kg/m. Physical Exam  Constitutional: He is oriented to person, place, and time. He appears well-developed and well-nourished. No distress.  HENT:  Head: Normocephalic and atraumatic.  Right Ear: External ear normal.  Left Ear: External ear normal.  Nose: Nose normal.  Mouth/Throat: Oropharynx is clear and moist.  Eyes: Conjunctivae and EOM are normal. Pupils are equal, round, and reactive to light. Right eye  exhibits no discharge. Left eye exhibits no discharge. No scleral icterus.  Neck: Normal range of motion. Neck supple. No JVD present. No tracheal deviation present. No thyromegaly present.  Cardiovascular: Normal rate and regular rhythm.  Exam reveals no gallop and no friction rub.   No murmur heard. Pulmonary/Chest: No stridor. No respiratory distress. He has no wheezes. He has no rales. He exhibits no tenderness.  Decreased breath sound bilaterally. DOE. O2 [place]  Abdominal: Soft. Bowel sounds are normal. He exhibits no distension and no mass. There is no tenderness. There is no rebound and no guarding. No hernia.  Genitourinary: Rectal exam shows guaiac negative stool.  Musculoskeletal: Normal range of motion. He exhibits no edema or tenderness.  Lymphadenopathy:    He has no cervical adenopathy.  Neurological: He is alert and oriented to person, place, and time. He has normal reflexes. No cranial nerve deficit. He exhibits normal muscle tone. Coordination normal.  Skin: Skin is warm and dry. No rash noted. He is not diaphoretic. No erythema. No pallor.  Psychiatric: He has a normal mood and affect. His behavior is normal. Thought content normal.    Labs reviewed:  Recent Labs  03/12/16 09/15/16  NA 144 146  K 4.1 3.8  BUN 21 24*  CREATININE 0.7 1.0    Recent Labs  03/12/16 09/15/16  AST 13* 13*  ALT 12 12  ALKPHOS 62 55    Recent Labs  03/12/16 09/15/16  WBC 8.1 7.3  HGB 12.7* 12.4*  HCT 40* 39*  PLT 243 218   Lab Results  Component Value Date   TSH 1.27 09/15/2016   Lab Results  Component Value Date   HGBA1C 5.9 09/15/2016   No results found for: CHOL, HDL, LDLCALC, LDLDIRECT, TRIG, CHOLHDL  Significant Diagnostic Results in last 30 days:  No results found.  Assessment/Plan Essential hypertension Controlled, continue Cardizem 120mg , Lisinopril 40mg  daily, Doamox 250mg  daily, 09/16/16 wbc 7.3, Hgb 12.4, plt 218, Na 146, k 3.8, Bun 24, creat 0.99, TP 5.4,  albumin 3.4, TSH 1.27, Hgb a1c 5.9.    Atrial fibrillation with RVR (HCC) Transient Atrial fibrillation with RVR (HCC): New onset likely due to respiratory failure and tachypnea when in hospital.  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 end stage COPD on 5 L oxygen per  [place] at home. On chronic prednisone, 5mg  and 10mg /day alternating dose, continue Symbicort bid, Albuterol neb tid, Albuterol HFA 2puffs 4hr prn, Tiotropium 1 puff daily.  Apparent SOB, cant complete a sentence without a break.   Constipation Stable, continue MiraLax prn  Hypothyroidism continue  Synthroid . TSH 1.27 09/16/16  Macrocytic anemia Hgb 12.4 09/15/16  Generalized anxiety disorder Stable, continue Lorazepam 0.5mg  bid and Mirtazapine 7.5mg  qhs  Failure to thrive in adult Weights are stable, SOB at rest. Continue supportive care  Edema Not apparent. BNP 99.9 03/12/16, continue Diamox. 09/15/16 creat 1.0     Family/ staff Communication: AL  Labs/tests ordered:  none

## 2016-10-14 NOTE — Assessment & Plan Note (Signed)
Stable, continue Lorazepam 0.5mg bid and Mirtazapine 7.5mg qhs    

## 2016-10-14 NOTE — Assessment & Plan Note (Signed)
Hgb 12.4 09/15/16

## 2016-10-14 NOTE — Assessment & Plan Note (Signed)
Transient Atrial fibrillation with RVR (HCC): New onset likely due to respiratory failure and tachypnea when in hospital.  - Currently improved, continue Cardizem 120 mg daily - Not on anticoagulation, is not candidate for anticoagulation due to severe epistaxis, off ASA too  

## 2016-10-14 NOTE — Assessment & Plan Note (Signed)
Weights are stable, SOB at rest. Continue supportive care  

## 2016-10-14 NOTE — Assessment & Plan Note (Signed)
end stage COPD on 5 L oxygen per [place] at home. On chronic prednisone, 5mg and 10mg/day alternating dose, continue Symbicort bid, Albuterol neb tid, Albuterol HFA 2puffs 4hr prn, Tiotropium 1 puff daily.  Apparent SOB, cant complete a sentence without a break.  

## 2016-10-14 NOTE — Assessment & Plan Note (Signed)
Not apparent. BNP 99.9 03/12/16, continue Diamox. 09/15/16 creat 1.0

## 2016-10-31 DIAGNOSIS — J449 Chronic obstructive pulmonary disease, unspecified: Secondary | ICD-10-CM | POA: Diagnosis not present

## 2016-11-06 DIAGNOSIS — J449 Chronic obstructive pulmonary disease, unspecified: Secondary | ICD-10-CM | POA: Diagnosis not present

## 2016-11-19 ENCOUNTER — Non-Acute Institutional Stay: Payer: Medicare Other | Admitting: Internal Medicine

## 2016-11-19 ENCOUNTER — Encounter: Payer: Self-pay | Admitting: Internal Medicine

## 2016-11-19 DIAGNOSIS — I4891 Unspecified atrial fibrillation: Secondary | ICD-10-CM | POA: Diagnosis not present

## 2016-11-19 DIAGNOSIS — R634 Abnormal weight loss: Secondary | ICD-10-CM | POA: Diagnosis not present

## 2016-11-19 DIAGNOSIS — J430 Unilateral pulmonary emphysema [MacLeod's syndrome]: Secondary | ICD-10-CM | POA: Diagnosis not present

## 2016-11-19 DIAGNOSIS — R627 Adult failure to thrive: Secondary | ICD-10-CM | POA: Diagnosis not present

## 2016-11-19 DIAGNOSIS — I10 Essential (primary) hypertension: Secondary | ICD-10-CM | POA: Diagnosis not present

## 2016-11-19 DIAGNOSIS — E039 Hypothyroidism, unspecified: Secondary | ICD-10-CM | POA: Diagnosis not present

## 2016-11-19 DIAGNOSIS — J9610 Chronic respiratory failure, unspecified whether with hypoxia or hypercapnia: Secondary | ICD-10-CM | POA: Diagnosis not present

## 2016-11-19 MED ORDER — ROFLUMILAST 500 MCG PO TABS: 500.0000 ug | ORAL_TABLET | Freq: Every day | ORAL | Status: DC

## 2016-11-19 MED ORDER — GLYCOPYRROLATE 15.6 MCG IN CAPS: 1.0000 | ORAL_CAPSULE | Freq: Two times a day (BID) | RESPIRATORY_TRACT | 5 refills | Status: DC

## 2016-11-19 NOTE — Progress Notes (Signed)
Progress Note    Location:  [facility] Room Number: [phone] Place of Service:  ALF [phone]) Provider:  Murray Hodgkins, MD  Patient Care Team: Kimber Relic, MD as PCP - General (Internal Medicine) [name], MD as Consulting Physician (Cardiology) Mckinley Jewel, MD as Consulting Physician (Ophthalmology) Newman Pies, MD as Consulting Physician (Otolaryngology) Michele Mcalpine, MD as Consulting Physician (Pulmonary Disease) Man Johnney Ou, NP as Nurse Practitioner (Internal Medicine)  Extended Emergency Contact Information Primary Emergency Contact: Helsley,Nathan Address: [address]          [place], [place] Home Phone: [phone] Work Phone: [phone] Relation: Son Secondary Emergency Contact: Grossberg,Kimberly Address: [address]          [place], [place] [address] [place] Home Phone: [phone] Work Phone: [phone] Mobile Phone: [phone] Relation: Daughter  Code Status:  DNR Goals of care: Advanced Directive information Advanced Directives 11/19/2016  Does Patient Have a Medical Advance Directive? Yes  Type of Estate agent of [place];Out of facility DNR (pink MOST or yellow form)  Does patient want to make changes to medical advance directive? -  Copy of Healthcare Power of Attorney in Chart? Yes  Pre-existing out of facility DNR order (yellow form or pink MOST form) Yellow form placed in chart (order not valid for inpatient use)     Chief Complaint  Patient presents with  . Medical Management of Chronic Issues    routine visit    HPI:  Pt is a 76 y.o. male seen today for medical management of chronic diseases.    Chronic respiratory failure, unspecified whether with hypoxia or hypercapnia (HCC) - he feels his breathing is worse. Says the Silvestre Gunner is not helping.  Unilateral emphysema (HCC) - unchanged  Essential hypertension - controlled  Failure to thrive  in adult - related to his respiratory failure  Atrial fibrillation with RVR (HCC) - rate controlled and anticoagulated  Hypothyroidism, unspecified type - compensated  Loss of weight - stable for the last few months.     Past Medical History:  Diagnosis Date  . COPD (chronic obstructive pulmonary disease) (HCC)   . Coronary artery disease   . Emphysema   . Glaucoma   . Hypercholesterolemia   . Hypertension   . 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 Thyroid disease   . Unstable gait 07/19/2015  . Weak 07/19/2015   Past Surgical History:  Procedure Laterality Date  . BACK SURGERY  1982  . CATARACT EXTRACTION  1996/2003   bilateral  . CORONARY ANGIOPLASTY WITH STENT PLACEMENT  2009  . TONSILLECTOMY  1943  . VASECTOMY  1970  . WISDOM TOOTH EXTRACTION  1977    Allergies  Allergen Reactions  . Benazepril Other (See Comments)    sensitive to sun light  . Benazepril Hcl     REACTION: sensitive to sunlight  . Levofloxacin     REACTION: tendonitis    Allergies as of 11/19/2016      Reactions   Benazepril Other (See Comments)   sensitive to sun light   Benazepril Hcl    REACTION: sensitive to sunlight   Levofloxacin    REACTION: tendonitis      Medication List       Accurate as of 11/19/16  2:11 PM. Always use your most recent med list.          acetaZOLAMIDE 250 MG tablet Commonly known as:  DIAMOX TAKE 1  TABLET IN THE MORNING.   albuterol 108 (90 Base) MCG/ACT inhaler Commonly known as:  PROAIR HFA Inhale 2 puffs into the lungs every 4 (four) hours as needed for shortness of breath.   albuterol (2.5 MG/3ML) 0.083% nebulizer solution Commonly known as:  PROVENTIL Take 3 mLs (2.5 mg total) by nebulization 3 (three) times daily. DX J44.1   brimonidine 0.2 % ophthalmic solution Commonly known as:  ALPHAGAN Place 1 drop into the left eye 2 (two) times daily.   budesonide-formoterol 160-4.5 MCG/ACT inhaler Commonly known as:  SYMBICORT Inhale 2 puffs into the lungs  2 (two) times daily.   CALCIUM 600-D 600-400 MG-UNIT tablet Generic drug:  Calcium Carbonate-Vitamin D Take 1 tablet by mouth daily.   cholecalciferol 1000 units tablet Commonly known as:  VITAMIN D Take 1,000 Units by mouth daily.   diltiazem 120 MG 24 hr capsule Commonly known as:  CARDIZEM CD Take 120 mg by mouth daily.   lactose free nutrition Liqd Take 237 mLs by mouth. One 237 ml every day at noon   latanoprost 0.005 % ophthalmic solution Commonly known as:  XALATAN Place 1 drop into both eyes at bedtime.   levothyroxine 150 MCG tablet Commonly known as:  SYNTHROID, LEVOTHROID Take 150 mcg by mouth daily before breakfast.   lisinopril 40 MG tablet Commonly known as:  PRINIVIL,ZESTRIL Take 40 mg by mouth daily.   LORazepam 0.5 MG tablet Commonly known as:  ATIVAN Take one tablet by mouth twice daily   mirtazapine 7.5 MG tablet Commonly known as:  REMERON Take 7.5 mg by mouth at bedtime.   OXYGEN Inhale into the lungs. Give 3.5L to 4 L per minute via nasal cannula.   polyethylene glycol packet Commonly known as:  MIRALAX / GLYCOLAX Take 17 g by mouth daily as needed.   predniSONE 10 MG tablet Commonly known as:  DELTASONE Take 10 mg by mouth. Take one tablet by mouth alternating with 5 mg daily   predniSONE 5 MG tablet Commonly known as:  DELTASONE Take 5 mg by mouth daily with breakfast. Take one tablet daily alternating with 10 mg   saccharomyces boulardii 250 MG capsule Commonly known as:  FLORASTOR Take 250 mg by mouth 2 (two) times daily.   Tiotropium Bromide Monohydrate 2.5 MCG/ACT Aers Commonly known as:  SPIRIVA RESPIMAT Take 1 puff by mouth daily.       Review of Systems  Constitutional: Positive for fatigue. Negative for appetite change and fever.  HENT: Negative for congestion, ear pain, nosebleeds and sore throat.   Eyes: Negative for redness.  Respiratory: Positive for shortness of breath. Negative for wheezing.        COPD. DOE. O2  dependent.  Cardiovascular: Negative for palpitations and leg swelling.  Gastrointestinal: Negative for nausea and vomiting.  Genitourinary: Positive for flank pain and frequency. Negative for dysuria.  Musculoskeletal: Positive for arthralgias and gait problem. Negative for joint swelling.       Ambulates with walker  Skin: Negative for rash.  Neurological: Negative for tremors, weakness and headaches.  Hematological: Does not bruise/bleed easily.  Psychiatric/Behavioral: Negative for confusion and sleep disturbance. The patient is not nervous/anxious.     Immunization History  Administered Date(s) Administered  . Influenza Split 05/16/2012, 06/17/2013  . Influenza Whole 05/17/2009, 04/24/2010, 05/18/2011  . Influenza,inj,Quad PF,36+ Mos 04/20/2014  . Influenza-Unspecified 04/18/2015, 06/03/2016  . Pneumococcal Polysaccharide-23 05/17/2009, 04/18/2015   Pertinent  Health Maintenance Due  Topic Date Due  . PNA vac Low Risk  Adult (2 of 2 - PCV13) 04/17/2016  . INFLUENZA VACCINE  03/17/2017   Fall Risk  11/21/2015  Falls in the past year? No    Vitals:   11/19/16 1400  BP: 134/80  Pulse: 76  Resp: 20  Temp: 97.1 F (36.2 C)  SpO2: 99%  Weight: 137 lb (62.1 kg)  Height:  (1.854 m)   Body mass index is 18.07 kg/m.  Wt Readings from Last 3 Encounters:  11/19/16 137 lb (62.1 kg)  10/14/16 137 lb 3.2 oz (62.2 kg)  09/10/16 135 lb 3.2 oz (61.3 kg)    Physical Exam  Constitutional: He is oriented to person, place, and time. He appears well-developed. No distress.  Thin. Frail.  HENT:  Head: Normocephalic and atraumatic.  Right Ear: External ear normal.  Left Ear: External ear normal.  Nose: Nose normal.  Mouth/Throat: Oropharynx is clear and moist.  deaf  Eyes: Conjunctivae and EOM are normal. Pupils are equal, round, and reactive to light. Right eye exhibits no discharge. Left eye exhibits no discharge. No scleral icterus.  Neck: Normal range of motion. Neck  supple. No JVD present. No tracheal deviation present. No thyromegaly present.  Cardiovascular: Normal rate and regular rhythm.  Exam reveals no gallop and no friction rub.   No murmur heard. Pulmonary/Chest: No stridor. No respiratory distress. He has no wheezes. He has no rales. He exhibits no tenderness.  Decreased breath sound bilaterally. DOE. O2 [place]  Abdominal: Soft. Bowel sounds are normal. He exhibits no distension and no mass. There is no tenderness. There is no rebound and no guarding. No hernia.  Genitourinary: Rectal exam shows guaiac negative stool.  Musculoskeletal: Normal range of motion. He exhibits no edema or tenderness.  Lymphadenopathy:    He has no cervical adenopathy.  Neurological: He is alert and oriented to person, place, and time. He has normal reflexes. No cranial nerve deficit. He exhibits normal muscle tone. Coordination normal.  Skin: Skin is warm and dry. No rash noted. He is not diaphoretic. No erythema. No pallor.  Psychiatric: He has a normal mood and affect. His behavior is normal. Thought content normal.    Labs reviewed:  Recent Labs  03/12/16 09/15/16  NA 144 146  K 4.1 3.8  BUN 21 24*  CREATININE 0.7 1.0    Recent Labs  03/12/16 09/15/16  AST 13* 13*  ALT 12 12  ALKPHOS 62 55    Recent Labs  03/12/16 09/15/16  WBC 8.1 7.3  HGB 12.7* 12.4*  HCT 40* 39*  PLT 243 218   Lab Results  Component Value Date   TSH 1.27 09/15/2016   Lab Results  Component Value Date   HGBA1C 5.9 09/15/2016   Assessment/Plan  1. Chronic respiratory failure, unspecified whether with hypoxia or hypercapnia (HCC) - Stop Spiriva - roflumilast (DALIRESP) tablet 500 mcg; Take 1 tablet (500 mcg total) by mouth daily. - Glycopyrrolate (SEEBRI NEOHALER) 15.6 MCG CAPS; Place 1 capsule into inhaler and inhale 2 (two) times daily.  Dispense: 60 capsule; Refill: 5  2. Unilateral emphysema (HCC) See new meds  3. Essential hypertension controlled  4. Failure to  thrive in adult Related to respiratory failure  5. Atrial fibrillation with RVR (HCC) controlled  6. Hypothyroidism, unspecified type compensated  7. Loss of weight stable

## 2016-11-20 ENCOUNTER — Telehealth: Payer: Self-pay | Admitting: *Deleted

## 2016-11-20 NOTE — Telephone Encounter (Signed)
Received fax from [facility] for Authorization on Daliresp is APPROVED through 08/16/2017 Patient ID: [id number] Ref #:[id number] Sent for scanning

## 2016-11-24 ENCOUNTER — Telehealth: Payer: Self-pay | Admitting: *Deleted

## 2016-11-24 NOTE — Telephone Encounter (Signed)
Received fax from [facility] #[id number] for Authorization for Daliresp one daily. Form placed in Dr. Thomasene Lot folder to review, fill out and sign.  Member ID#: [id number]

## 2016-12-01 NOTE — Telephone Encounter (Signed)
Received fax from [facility] #[id number] stating Brendan Herring is APPROVED from 11/19/16-08/16/2017 [id number] Patient ID: [id number]

## 2016-12-07 DIAGNOSIS — J449 Chronic obstructive pulmonary disease, unspecified: Secondary | ICD-10-CM | POA: Diagnosis not present

## 2016-12-08 DIAGNOSIS — M79671 Pain in right foot: Secondary | ICD-10-CM | POA: Diagnosis not present

## 2016-12-08 DIAGNOSIS — L602 Onychogryphosis: Secondary | ICD-10-CM | POA: Diagnosis not present

## 2016-12-08 DIAGNOSIS — L84 Corns and callosities: Secondary | ICD-10-CM | POA: Diagnosis not present

## 2016-12-08 DIAGNOSIS — M79672 Pain in left foot: Secondary | ICD-10-CM | POA: Diagnosis not present

## 2016-12-09 ENCOUNTER — Other Ambulatory Visit: Payer: Self-pay | Admitting: *Deleted

## 2016-12-09 MED ORDER — ACETAZOLAMIDE 250 MG PO TABS: 250.0000 mg | ORAL_TABLET | Freq: Every morning | ORAL | 3 refills | Status: AC

## 2016-12-31 DIAGNOSIS — J449 Chronic obstructive pulmonary disease, unspecified: Secondary | ICD-10-CM | POA: Diagnosis not present

## 2017-01-06 ENCOUNTER — Non-Acute Institutional Stay: Payer: Medicare Other | Admitting: Nurse Practitioner

## 2017-01-06 ENCOUNTER — Encounter: Payer: Self-pay | Admitting: Nurse Practitioner

## 2017-01-06 DIAGNOSIS — E039 Hypothyroidism, unspecified: Secondary | ICD-10-CM | POA: Diagnosis not present

## 2017-01-06 DIAGNOSIS — R609 Edema, unspecified: Secondary | ICD-10-CM

## 2017-01-06 DIAGNOSIS — J430 Unilateral pulmonary emphysema [MacLeod's syndrome]: Secondary | ICD-10-CM

## 2017-01-06 DIAGNOSIS — K5900 Constipation, unspecified: Secondary | ICD-10-CM

## 2017-01-06 DIAGNOSIS — F411 Generalized anxiety disorder: Secondary | ICD-10-CM | POA: Diagnosis not present

## 2017-01-06 DIAGNOSIS — I4891 Unspecified atrial fibrillation: Secondary | ICD-10-CM

## 2017-01-06 DIAGNOSIS — R627 Adult failure to thrive: Secondary | ICD-10-CM

## 2017-01-06 DIAGNOSIS — I10 Essential (primary) hypertension: Secondary | ICD-10-CM

## 2017-01-06 DIAGNOSIS — J449 Chronic obstructive pulmonary disease, unspecified: Secondary | ICD-10-CM | POA: Diagnosis not present

## 2017-01-06 NOTE — Assessment & Plan Note (Signed)
Persists in setting of end stage of COPD, continue Lorazepam 1mg  tid, Morphine 5mg  q2h prn available to him, but has not been used. Continue Hospice Service.

## 2017-01-06 NOTE — Progress Notes (Signed)
Location:  [name] Room Number: [phone] Place of Service:  ALF [id number](13) Provider:  Mast, Manxie  NP  Kimber RelicGreen, Arthur G, MD  Patient Care Team: Kimber RelicGreen, Arthur G, MD as PCP - General (Internal Medicine) Corky CraftsVaranasi, Jayadeep S, MD as Consulting Physician (Cardiology) Mckinley JewelStoneburner, Sara, MD as Consulting Physician (Ophthalmology) Newman Pieseoh, Su, MD as Consulting Physician (Otolaryngology) Michele McalpineNadel, Scott M, MD as Consulting Physician (Pulmonary Disease) Mast, Man X, NP as Nurse Practitioner (Internal Medicine)  Extended Emergency Contact Information Primary Emergency Contact: Baroni,Nathan Address: [address]          [place], [place] Home Phone: [phone] Work Phone: [phone] Relation: Son Secondary Emergency Contact: Kucher,Kimberly Address: [address]          [place], [place] [address] [place] Home Phone: [phone] Work Phone: [phone] Mobile Phone: [phone] Relation: Daughter  Code Status:  DNR Goals of care: Advanced Directive information Advanced Directives 01/06/2017  Does Patient Have a Medical Advance Directive? Yes  Type of Estate agentAdvance Directive Healthcare Power of [place];Out of facility DNR (pink MOST or yellow form)  Does patient want to make changes to medical advance directive? No - Patient declined  Copy of Healthcare Power of Attorney in Chart? Yes  Pre-existing out of facility DNR order (yellow form or pink MOST form) Yellow form placed in chart (order not valid for inpatient use)     Chief Complaint  Patient presents with  . Acute Visit    HPI:  Pt is a 76 y.o. male seen today for evaluation of persisted FTT in setting of end stage of COPD   Hx of ankle edema, not apparent, taking Diamox 250mg  daily. HTN, controlled while on Diamox 250mg , Cardizem 120mg  daily, Lisinopril 40mg . blood sugar, diet controlled, CBG <126 fasting in am in the past, thyroid, taking Levothyroxine 150mcg, last  TSH 1.27 09/15/16. COPD, O2, DOE, taking DuoNeb tid, Daliresp 500mg  qd, Seebri bid, Symbicort bid, Prednisone 20mg . anxiety, insomnia, are stable, on Lorazepam bid and Mirtazapine 15mg . Constipation, stable while on Miralax qod. Ambulates with walker, stronger, but apparent SOB at rest, cant complete a whole sentence without taking a break.    Past Medical History:  Diagnosis Date  . COPD (chronic obstructive pulmonary disease) (HCC)   . Coronary artery disease   . Emphysema   . Glaucoma   . Hypercholesterolemia   . Hypertension   . 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 Thyroid disease   . Unstable gait 07/19/2015  . Weak 07/19/2015   Past Surgical History:  Procedure Laterality Date  . BACK SURGERY  1982  . CATARACT EXTRACTION  1996/2003   bilateral  . CORONARY ANGIOPLASTY WITH STENT PLACEMENT  2009  . TONSILLECTOMY  1943  . VASECTOMY  1970  . WISDOM TOOTH EXTRACTION  1977    Allergies  Allergen Reactions  . Benazepril Other (See Comments)    sensitive to sun light  . Benazepril Hcl     REACTION: sensitive to sunlight  . Levofloxacin     REACTION: tendonitis    Allergies as of 01/06/2017      Reactions   Benazepril Other (See Comments)   sensitive to sun light   Benazepril Hcl    REACTION: sensitive to sunlight   Levofloxacin    REACTION: tendonitis      Medication List       Accurate as of 01/06/17  3:56 PM. Always use your most recent med list.  acetaZOLAMIDE 250 MG tablet Commonly known as:  DIAMOX Take 1 tablet (250 mg total) by mouth every morning.   albuterol 108 (90 Base) MCG/ACT inhaler Commonly known as:  PROAIR HFA Inhale 2 puffs into the lungs every 4 (four) hours as needed for shortness of breath.   albuterol (2.5 MG/3ML) 0.083% nebulizer solution Commonly known as:  PROVENTIL Take 3 mLs (2.5 mg total) by nebulization 3 (three) times daily. DX J44.1   brimonidine 0.2 % ophthalmic solution Commonly known as:  ALPHAGAN Place 1 drop into the  left eye 2 (two) times daily.   budesonide-formoterol 160-4.5 MCG/ACT inhaler Commonly known as:  SYMBICORT Inhale 2 puffs into the lungs 2 (two) times daily.   CALCIUM 600-D 600-400 MG-UNIT tablet Generic drug:  Calcium Carbonate-Vitamin D Take 1 tablet by mouth daily.   cholecalciferol 1000 units tablet Commonly known as:  VITAMIN D Take 1,000 Units by mouth daily.   DALIRESP 500 MCG Tabs tablet Generic drug:  roflumilast Take 500 mcg by mouth daily.   diltiazem 120 MG 24 hr capsule Commonly known as:  CARDIZEM CD Take 120 mg by mouth daily.   Glycopyrrolate 15.6 MCG Caps Commonly known as:  SEEBRI NEOHALER Place 1 capsule into inhaler and inhale 2 (two) times daily.   lactose free nutrition Liqd Take 237 mLs by mouth. One 237 ml every day at noon   latanoprost 0.005 % ophthalmic solution Commonly known as:  XALATAN Place 1 drop into both eyes at bedtime.   levothyroxine 150 MCG tablet Commonly known as:  SYNTHROID, LEVOTHROID Take 150 mcg by mouth daily before breakfast.   lisinopril 40 MG tablet Commonly known as:  PRINIVIL,ZESTRIL Take 40 mg by mouth daily.   LORazepam 0.5 MG tablet Commonly known as:  ATIVAN Take one tablet by mouth twice daily   mirtazapine 15 MG tablet Commonly known as:  REMERON Take 15 mg by mouth at bedtime.   OXYGEN Inhale into the lungs. Give 3.5L to 4 L per minute via nasal cannula.   polyethylene glycol packet Commonly known as:  MIRALAX / GLYCOLAX Take 17 g by mouth every other day. Mix in 4-8 oz of fluid.   predniSONE 10 MG tablet Commonly known as:  DELTASONE Take 20 mg by mouth daily. Take one tablet by mouth alternating with 5 mg daily   saccharomyces boulardii 250 MG capsule Commonly known as:  FLORASTOR Take 250 mg by mouth 2 (two) times daily.       Review of Systems  Constitutional: Positive for appetite change and fatigue. Negative for fever.  HENT: Negative for congestion, ear pain, nosebleeds and sore  throat.   Eyes: Negative for redness.  Respiratory: Positive for shortness of breath. Negative for wheezing.        DOE  Cardiovascular: Negative for palpitations and leg swelling.  Gastrointestinal: Negative for nausea and vomiting.  Genitourinary: Positive for flank pain and frequency. Negative for dysuria.  Musculoskeletal: Positive for arthralgias and gait problem. Negative for joint swelling.       Ambulates with walker  Skin: Negative for rash.  Neurological: Negative for tremors, weakness and headaches.  Hematological: Does not bruise/bleed easily.  Psychiatric/Behavioral: Negative for confusion and sleep disturbance. The patient is not nervous/anxious.     Immunization History  Administered Date(s) Administered  . Influenza Split 05/16/2012, 06/17/2013  . Influenza Whole 05/17/2009, 04/24/2010, 05/18/2011  . Influenza,inj,Quad PF,36+ Mos 04/20/2014  . Influenza-Unspecified 04/18/2015, 06/03/2016  . Pneumococcal Polysaccharide-23 05/17/2009, 04/18/2015   Pertinent  Health  Maintenance Due  Topic Date Due  . PNA vac Low Risk Adult (2 of 2 - PCV13) 04/17/2016  . INFLUENZA VACCINE  03/17/2017   Fall Risk  11/21/2015  Falls in the past year? No   Functional Status Survey:    Vitals:   01/06/17 1528  BP: (!) 142/74  Pulse: 98  Resp: (!) 22  Temp: 98.6 F (37 C)  SpO2: 98%  Weight: 133 lb 3.2 oz (60.4 kg)  Height: 6\' 1"  (1.854 m)   Body mass index is 17.57 kg/m. Physical Exam  Constitutional: He is oriented to person, place, and time. He appears well-developed and well-nourished. No distress.  HENT:  Head: Normocephalic and atraumatic.  Right Ear: External ear normal.  Left Ear: External ear normal.  Nose: Nose normal.  Mouth/Throat: Oropharynx is clear and moist.  Eyes: Conjunctivae and EOM are normal. Pupils are equal, round, and reactive to light. Right eye exhibits no discharge. Left eye exhibits no discharge. No scleral icterus.  Neck: Normal range of  motion. Neck supple. No JVD present. No tracheal deviation present. No thyromegaly present.  Cardiovascular: Normal rate and regular rhythm.  Exam reveals no gallop and no friction rub.   No murmur heard. Pulmonary/Chest: No stridor. No respiratory distress. He has no wheezes. He has no rales. He exhibits no tenderness.  Decreased breath sound bilaterally. DOE. O2 [place]  Abdominal: Soft. Bowel sounds are normal. He exhibits no distension and no mass. There is no tenderness. There is no rebound and no guarding. No hernia.  Genitourinary: Rectal exam shows guaiac negative stool.  Musculoskeletal: Normal range of motion. He exhibits no edema or tenderness.  Lymphadenopathy:    He has no cervical adenopathy.  Neurological: He is alert and oriented to person, place, and time. He has normal reflexes. No cranial nerve deficit. He exhibits normal muscle tone. Coordination normal.  Skin: Skin is warm and dry. No rash noted. He is not diaphoretic. No erythema. No pallor.  Psychiatric: He has a normal mood and affect. His behavior is normal. Thought content normal.    Labs reviewed:  Recent Labs  03/12/16 09/15/16  NA 144 146  K 4.1 3.8  BUN 21 24*  CREATININE 0.7 1.0    Recent Labs  03/12/16 09/15/16  AST 13* 13*  ALT 12 12  ALKPHOS 62 55    Recent Labs  03/12/16 09/15/16  WBC 8.1 7.3  HGB 12.7* 12.4*  HCT 40* 39*  PLT 243 218   Lab Results  Component Value Date   TSH 1.27 09/15/2016   Lab Results  Component Value Date   HGBA1C 5.9 09/15/2016   No results found for: CHOL, HDL, LDLCALC, LDLDIRECT, TRIG, CHOLHDL  Significant Diagnostic Results in last 30 days:  No results found.  Assessment/Plan Failure to thrive in adult Persists in setting of end stage of COPD, continue Lorazepam 1mg  tid, Morphine 5mg  q2h prn available to him, but has not been used. Continue Hospice Service.   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 Continue to  progressing, continue O2, DOE, taking DuoNeb tid, Daliresp 500mg  qd, Seebri bid, Symbicort bid, Prednisone 20mg .  Generalized anxiety disorder Anxious, continue Lorazepam 1mg  tid, increase Mirtazapine to 15mg , Morphine 5mg  q2h prn. Observe, adjusting meds to meet comfort measures.   Hypothyroidism continue  Synthroid . TSH 1.27 09/16/16  Essential hypertension Controlled, continue Cardizem 120mg , Lisinopril 40mg  daily, Doamox 250mg  daily  Atrial fibrillation with RVR (HCC) Transient Atrial fibrillation with RVR (HCC): New  onset likely due to respiratory failure and tachypnea when in hospital.  - Currently improved, continue Cardizem 120 mg daily - Not on anticoagulation, is not candidate for anticoagulation due to severe epistaxis, off ASA too  Constipation Stable, continue MiraLax qod  Edema Not apparent. BNP 99.9 03/12/16, continue Diamox. 09/15/16 creat 1.0     Family/ staff Communication: [facility].   Labs/tests ordered:  none

## 2017-01-06 NOTE — Assessment & Plan Note (Signed)
continue  Synthroid 150mcg. TSH 1.27 09/16/16

## 2017-01-06 NOTE — Assessment & Plan Note (Signed)
Stable, continue MiraLax qod 

## 2017-01-06 NOTE — Assessment & Plan Note (Signed)
Controlled, continue Cardizem 120mg, Lisinopril 40mg daily, Doamox 250mg daily. 

## 2017-01-06 NOTE — Assessment & Plan Note (Signed)
Continue to progressing, continue O2, DOE, taking DuoNeb tid, Daliresp 500mg  qd, Seebri bid, Symbicort bid, Prednisone 20mg .

## 2017-01-06 NOTE — Progress Notes (Signed)
Location:  [facility] Room Number: [id number] Place of Service:  ALF [phone]) Provider: Mast, Manxie  NP  Kimber Relic, MD  Patient Care Team: Kimber Relic, MD as PCP - General (Internal Medicine) [name], MD as Consulting Physician (Cardiology) Mckinley Jewel, MD as Consulting Physician (Ophthalmology) Newman Pies, MD as Consulting Physician (Otolaryngology) Michele Mcalpine, MD as Consulting Physician (Pulmonary Disease) Mast, Man X, NP as Nurse Practitioner (Internal Medicine)  Extended Emergency Contact Information Primary Emergency Contact: Hessling,Nathan Address: [address]          [place], [place] Home Phone: [phone] Work Phone: [phone] Relation: Son Secondary Emergency Contact: Stidd,Kimberly Address: [address] [place], [place] [address] [place] Home Phone: [phone] Work Phone: [phone] Mobile Phone: [phone] Relation: Daughter  Code Status:  DNR Goals of care: Advanced Directive information Advanced Directives 01/06/2017  Does Patient Have a Medical Advance Directive? Yes  Type of Estate agent of [place];Out of facility DNR (pink MOST or yellow form)  Does patient want to make changes to medical advance directive? No - Patient declined  Copy of Healthcare Power of Attorney in Chart? Yes  Pre-existing out of facility DNR order (yellow form or pink MOST form) Yellow form placed in chart (order not valid for inpatient use)     Chief Complaint  Patient presents with  . Acute Visit    HPI:  Pt is a 76 y.o. male seen today for an acute visit for    Past Medical History:  Diagnosis Date  . COPD (chronic obstructive pulmonary disease) (HCC)   . Coronary artery disease   . Emphysema   . Glaucoma   . Hypercholesterolemia   . Hypertension   . 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 Thyroid disease   . Unstable gait 07/19/2015  . Weak  07/19/2015   Past Surgical History:  Procedure Laterality Date  . BACK SURGERY  1982  . CATARACT EXTRACTION  1996/2003   bilateral  . CORONARY ANGIOPLASTY WITH STENT PLACEMENT  2009  . TONSILLECTOMY  1943  . VASECTOMY  1970  . WISDOM TOOTH EXTRACTION  1977    Allergies  Allergen Reactions  . Benazepril Other (See Comments)    sensitive to sun light  . Benazepril Hcl     REACTION: sensitive to sunlight  . Levofloxacin     REACTION: tendonitis    Outpatient Encounter Prescriptions as of 01/06/2017  Medication Sig  . acetaZOLAMIDE (DIAMOX) 250 MG tablet Take 1 tablet (250 mg total) by mouth every morning.  [name] albuterol (PROAIR HFA) 108 (90 BASE) MCG/ACT inhaler Inhale 2 puffs into the lungs every 4 (four) hours as needed for shortness of breath.  [name] albuterol (PROVENTIL) (2.5 MG/3ML) 0.083% nebulizer solution Take 3 mLs (2.5 mg total) by nebulization 3 (three) times daily. DX J44.1  . brimonidine (ALPHAGAN) 0.2 % ophthalmic solution Place 1 drop into the left eye 2 (two) times daily.  . budesonide-formoterol (SYMBICORT) 160-4.5 MCG/ACT inhaler Inhale 2 puffs into the lungs 2 (two) times daily.  . Calcium Carbonate-Vitamin D (CALCIUM 600-D) 600-400 MG-UNIT per tablet Take 1 tablet by mouth daily.   . cholecalciferol (VITAMIN D) 1000 UNITS tablet Take 1,000 Units by mouth daily.  [name] diltiazem (CARDIZEM CD) 120 MG 24 hr capsule Take 120 mg by mouth daily.  . Glycopyrrolate (SEEBRI NEOHALER) 15.6 MCG CAPS Place 1 capsule into inhaler and  inhale 2 (two) times daily.  [name]. lactose free nutrition (BOOST) LIQD Take 237 mLs by mouth. One 237 ml every day at noon  . latanoprost (XALATAN) 0.005 % ophthalmic solution Place 1 drop into both eyes at bedtime.  [name]. levothyroxine (SYNTHROID, LEVOTHROID) 150 MCG tablet Take 150 mcg by mouth daily before breakfast.  . lisinopril (PRINIVIL,ZESTRIL) 40 MG tablet Take 40 mg by mouth daily.  [name]. LORazepam (ATIVAN) 0.5 MG tablet Take one tablet by mouth twice daily    . mirtazapine (REMERON) 15 MG tablet Take 15 mg by mouth at bedtime.  . OXYGEN Inhale into the lungs. Give 3.5L to 4 L per minute via nasal cannula.  . polyethylene glycol (MIRALAX / GLYCOLAX) packet Take 17 g by mouth every other day. Mix in 4-8 oz of fluid.  . predniSONE (DELTASONE) 10 MG tablet Take 20 mg by mouth daily. Take one tablet by mouth alternating with 5 mg daily   . roflumilast (DALIRESP) 500 MCG TABS tablet Take 500 mcg by mouth daily.  [name]. saccharomyces boulardii (FLORASTOR) 250 MG capsule Take 250 mg by mouth 2 (two) times daily.  . [DISCONTINUED] mirtazapine (REMERON) 7.5 MG tablet Take 7.5 mg by mouth at bedtime.  . [DISCONTINUED] predniSONE (DELTASONE) 5 MG tablet Take 5 mg by mouth daily with breakfast. Take one tablet daily alternating with 10 mg  . [DISCONTINUED] roflumilast (DALIRESP) tablet 500 mcg    No facility-administered encounter medications on file as of 01/06/2017.     Review of Systems  Immunization History  Administered Date(s) Administered  . Influenza Split 05/16/2012, 06/17/2013  . Influenza Whole 05/17/2009, 04/24/2010, 05/18/2011  . Influenza,inj,Quad PF,36+ Mos 04/20/2014  . Influenza-Unspecified 04/18/2015, 06/03/2016  . Pneumococcal Polysaccharide-23 05/17/2009, 04/18/2015   Pertinent  Health Maintenance Due  Topic Date Due  . PNA vac Low Risk Adult (2 of 2 - PCV13) 04/17/2016  . INFLUENZA VACCINE  03/17/2017   Fall Risk  11/21/2015  Falls in the past year? No   Functional Status Survey:    Vitals:   01/06/17 1528  BP: (!) 142/74  Pulse: 98  Resp: (!) 22  Temp: 98.6 F (37 C)  SpO2: 98%  Weight: 133 lb 3.2 oz (60.4 kg)  Height: 6\' 1"  (1.854 m)   Body mass index is 17.57 kg/m. Physical Exam  Labs reviewed:  Recent Labs  03/12/16 09/15/16  NA 144 146  K 4.1 3.8  BUN 21 24*  CREATININE 0.7 1.0    Recent Labs  03/12/16 09/15/16  AST 13* 13*  ALT 12 12  ALKPHOS 62 55    Recent Labs  03/12/16 09/15/16  WBC 8.1 7.3   HGB 12.7* 12.4*  HCT 40* 39*  PLT 243 218   Lab Results  Component Value Date   TSH 1.27 09/15/2016   Lab Results  Component Value Date   HGBA1C 5.9 09/15/2016   No results found for: CHOL, HDL, LDLCALC, LDLDIRECT, TRIG, CHOLHDL  Significant Diagnostic Results in last 30 days:  No results found.  Assessment/Plan 1. Failure to thrive in adult   2. Unilateral emphysema (HCC)   3. Generalized anxiety disorder   4. Hypothyroidism, unspecified type   5. Essential hypertension   6. Atrial fibrillation with RVR (HCC)   7. Constipation, unspecified constipation type   8. Edema, unspecified type     Family/ staff Communication:  Labs/tests ordered:

## 2017-01-06 NOTE — Assessment & Plan Note (Signed)
Transient Atrial fibrillation with RVR (HCC): New onset likely due to respiratory failure and tachypnea when in hospital.  - Currently improved, continue Cardizem 120 mg daily - Not on anticoagulation, is not candidate for anticoagulation due to severe epistaxis, off ASA too  

## 2017-01-06 NOTE — Assessment & Plan Note (Signed)
Not apparent. BNP 99.9 03/12/16, continue Diamox. 09/15/16 creat 1.0

## 2017-01-06 NOTE — Assessment & Plan Note (Signed)
Anxious, continue Lorazepam 1mg  tid, increase Mirtazapine to 15mg , Morphine 5mg  q2h prn. Observe, adjusting meds to meet comfort measures.

## 2017-01-20 ENCOUNTER — Encounter: Payer: Self-pay | Admitting: Nurse Practitioner

## 2017-01-20 ENCOUNTER — Non-Acute Institutional Stay: Payer: Medicare Other | Admitting: Nurse Practitioner

## 2017-01-20 DIAGNOSIS — E039 Hypothyroidism, unspecified: Secondary | ICD-10-CM

## 2017-01-20 DIAGNOSIS — D539 Nutritional anemia, unspecified: Secondary | ICD-10-CM | POA: Diagnosis not present

## 2017-01-20 DIAGNOSIS — I10 Essential (primary) hypertension: Secondary | ICD-10-CM

## 2017-01-20 DIAGNOSIS — R627 Adult failure to thrive: Secondary | ICD-10-CM

## 2017-01-20 DIAGNOSIS — F411 Generalized anxiety disorder: Secondary | ICD-10-CM

## 2017-01-20 DIAGNOSIS — J430 Unilateral pulmonary emphysema [MacLeod's syndrome]: Secondary | ICD-10-CM

## 2017-01-20 DIAGNOSIS — R609 Edema, unspecified: Secondary | ICD-10-CM

## 2017-01-20 DIAGNOSIS — K5900 Constipation, unspecified: Secondary | ICD-10-CM | POA: Diagnosis not present

## 2017-01-20 DIAGNOSIS — I4891 Unspecified atrial fibrillation: Secondary | ICD-10-CM | POA: Diagnosis not present

## 2017-01-20 NOTE — Progress Notes (Signed)
Location:  [facility] Room Number: [phone] Place of Service:  ALF [phone]) Provider:  Gracin Soohoo, Manxie  NP  Kimber Relic, MD  Patient Care Team: Kimber Relic, MD as PCP - General (Internal Medicine) [name], MD as Consulting Physician (Cardiology) Mckinley Jewel, MD as Consulting Physician (Ophthalmology) Newman Pies, MD as Consulting Physician (Otolaryngology) Michele Mcalpine, MD as Consulting Physician (Pulmonary Disease) Nyela Cortinas X, NP as Nurse Practitioner (Internal Medicine)  Extended Emergency Contact Information Primary Emergency Contact: Facemire,Nathan Address: [address]          [place], [place] Home Phone: [phone] Work Phone: [phone] Relation: Son Secondary Emergency Contact: Millspaugh,Kimberly Address: [address] [place], [place] [address] [place] Home Phone: [phone] Work Phone: [phone] Mobile Phone: [phone] Relation: Daughter  Code Status:  DNR Goals of care: Advanced Directive information Advanced Directives 01/20/2017  Does Patient Have a Medical Advance Directive? Yes  Type of Estate agent of [place];Out of facility DNR (pink MOST or yellow form)  Does patient want to make changes to medical advance directive? No - Patient declined  Copy of Healthcare Power of Attorney in Chart? Yes  Pre-existing out of facility DNR order (yellow form or pink MOST form) Yellow form placed in chart (order not valid for inpatient use)     Chief Complaint  Patient presents with  . Medical Management of Chronic Issues    HPI:  Pt is a 76 y.o. male seen today for medical management of chronic diseases.     Hx of ankle edema, not apparent, taking Diamox 250mg  daily. HTN, controlled while on Diamox 250mg , Cardizem 120mg  daily, Lisinopril 40mg . blood sugar, diet controlled, CBG <126 fasting in am in the past, thyroid, taking Levothyroxine  , last TSH 1.27 09/15/16. COPD, O2, DOE, Albuterol q4h prn, Neb tid, Daliresp 500mg  qd, Symbicort bid, Prednisone 20mg . anxiety, insomnia, are stable, on Lorazepam bid and Mirtazapine 15mg . Constipation, stable while on Miralax qod. Ambulates with walker, weaker, apparent SOB at rest, cant complete a whole sentence without taking a break.                  Past Medical History:  Diagnosis Date  . COPD (chronic obstructive pulmonary disease) (HCC)   . Coronary artery disease   . Emphysema   . Glaucoma   . Hypercholesterolemia   . Hypertension   . 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 Thyroid disease   . Unstable gait 07/19/2015  . Weak 07/19/2015   Past Surgical History:  Procedure Laterality Date  . BACK SURGERY  1982  . CATARACT EXTRACTION  1996/2003   bilateral  . CORONARY ANGIOPLASTY WITH STENT PLACEMENT  2009  . TONSILLECTOMY  1943  . VASECTOMY  1970  . WISDOM TOOTH EXTRACTION  1977    Allergies  Allergen Reactions  . Benazepril Other (See Comments)    sensitive to sun light  . Benazepril Hcl     REACTION: sensitive to sunlight  . Levofloxacin     REACTION: tendonitis    Outpatient Encounter Prescriptions as of 01/20/2017  Medication Sig  . acetaZOLAMIDE (DIAMOX) 250 MG tablet Take 1 tablet (250 mg total) by mouth every morning.  [name] albuterol (PROAIR HFA) 108 (90 BASE) MCG/ACT inhaler Inhale 2 puffs into the lungs every 4 (four) hours as needed for shortness of breath.  . brimonidine (ALPHAGAN) 0.2 % ophthalmic solution Place  1 drop into the left eye 2 (two) times daily.  . budesonide-formoterol (SYMBICORT) 160-4.5 MCG/ACT inhaler Inhale 2 puffs into the lungs 2 (two) times daily.  . Calcium Carbonate-Vitamin D (CALCIUM 600-D) 600-400 MG-UNIT per tablet Take 1 tablet by mouth daily.   . cholecalciferol (VITAMIN D) 1000 UNITS tablet Take 1,000 Units by mouth daily.  [name] diltiazem (CARDIZEM CD) 120 MG 24 hr capsule Take 120 mg by mouth daily.  [name] lactose free nutrition (BOOST)  LIQD Take 237 mLs by mouth. One 237 ml every day at noon  . latanoprost (XALATAN) 0.005 % ophthalmic solution Place 1 drop into both eyes at bedtime.  [name] levothyroxine (SYNTHROID, LEVOTHROID) 150 MCG tablet Take 150 mcg by mouth daily before breakfast.  . lisinopril (PRINIVIL,ZESTRIL) 40 MG tablet Take 40 mg by mouth daily.  [name] LORazepam (ATIVAN) 0.5 MG tablet Take one tablet by mouth twice daily  . mirtazapine (REMERON) 15 MG tablet Take 15 mg by mouth at bedtime.  . OXYGEN Inhale into the lungs. Give 3.5L to 4 L per minute via nasal cannula.  . polyethylene glycol (MIRALAX / GLYCOLAX) packet Take 17 g by mouth every other day. Mix in 4-8 oz of fluid.  . predniSONE (DELTASONE) 10 MG tablet Take 20 mg by mouth daily. Take one tablet by mouth alternating with 5 mg daily   . roflumilast (DALIRESP) 500 MCG TABS tablet Take 500 mcg by mouth daily.  [name] saccharomyces boulardii (FLORASTOR) 250 MG capsule Take 250 mg by mouth 2 (two) times daily.  [name] albuterol (PROVENTIL) (2.5 MG/3ML) 0.083% nebulizer solution Take 3 mLs (2.5 mg total) by nebulization 3 (three) times daily. DX J44.1  . [DISCONTINUED] Glycopyrrolate (SEEBRI NEOHALER) 15.6 MCG CAPS Place 1 capsule into inhaler and inhale 2 (two) times daily.   No facility-administered encounter medications on file as of 01/20/2017.     Review of Systems  Constitutional: Positive for fatigue. Negative for appetite change and fever.  HENT: Negative for congestion, ear pain, nosebleeds and sore throat.   Eyes: Negative for redness.  Respiratory: Positive for shortness of breath. Negative for wheezing.        DOE  Cardiovascular: Negative for palpitations and leg swelling.  Gastrointestinal: Negative for nausea and vomiting.  Genitourinary: Positive for flank pain and frequency. Negative for dysuria.  Musculoskeletal: Positive for arthralgias and gait problem. Negative for joint swelling.       Ambulates with walker  Skin: Negative for rash.  Neurological:  Negative for tremors, weakness and headaches.  Hematological: Does not bruise/bleed easily.  Psychiatric/Behavioral: Negative for confusion and sleep disturbance. The patient is not nervous/anxious.     Immunization History  Administered Date(s) Administered  . Influenza Split 05/16/2012, 06/17/2013  . Influenza Whole 05/17/2009, 04/24/2010, 05/18/2011  . Influenza,inj,Quad PF,36+ Mos 04/20/2014  . Influenza-Unspecified 04/18/2015, 06/03/2016  . Pneumococcal Polysaccharide-23 05/17/2009, 04/18/2015   Pertinent  Health Maintenance Due  Topic Date Due  . PNA vac Low Risk Adult (2 of 2 - PCV13) 04/17/2016  . INFLUENZA VACCINE  03/17/2017   Fall Risk  11/21/2015  Falls in the past year? No   Functional Status Survey:    Vitals:   01/20/17 1316  BP: 128/66  Pulse: 86  Resp: 18  Temp: 97.9 F (36.6 C)  SpO2: 98%  Weight: 128 lb 9.6 oz (58.3 kg)  Height: 6\' 1"  (1.854 m)   Body mass index is 16.97 kg/m. Physical Exam  Constitutional: He is oriented to person, place, and time. He appears well-developed  and well-nourished. No distress.  HENT:  Head: Normocephalic and atraumatic.  Right Ear: External ear normal.  Left Ear: External ear normal.  Nose: Nose normal.  Mouth/Throat: Oropharynx is clear and moist.  Eyes: Conjunctivae and EOM are normal. Pupils are equal, round, and reactive to light. Right eye exhibits no discharge. Left eye exhibits no discharge. No scleral icterus.  Neck: Normal range of motion. Neck supple. No JVD present. No tracheal deviation present. No thyromegaly present.  Cardiovascular: Normal rate and regular rhythm.  Exam reveals no gallop and no friction rub.   No murmur heard. Pulmonary/Chest: No stridor. No respiratory distress. He has no wheezes. He has no rales. He exhibits no tenderness.  Decreased breath sound bilaterally. DOE. O2 [place]  Abdominal: Soft. Bowel sounds are normal. He exhibits no distension and no mass. There is no tenderness. There is no  rebound and no guarding. No hernia.  Genitourinary: Rectal exam shows guaiac negative stool.  Musculoskeletal: Normal range of motion. He exhibits no edema or tenderness.  Lymphadenopathy:    He has no cervical adenopathy.  Neurological: He is alert and oriented to person, place, and time. He has normal reflexes. No cranial nerve deficit. He exhibits normal muscle tone. Coordination normal.  Skin: Skin is warm and dry. No rash noted. He is not diaphoretic. No erythema. No pallor.  Psychiatric: He has a normal mood and affect. His behavior is normal. Thought content normal.    Labs reviewed:  Recent Labs  03/12/16 09/15/16  NA 144 146  K 4.1 3.8  BUN 21 24*  CREATININE 0.7 1.0    Recent Labs  03/12/16 09/15/16  AST 13* 13*  ALT 12 12  ALKPHOS 62 55    Recent Labs  03/12/16 09/15/16  WBC 8.1 7.3  HGB 12.7* 12.4*  HCT 40* 39*  PLT 243 218   Lab Results  Component Value Date   TSH 1.27 09/15/2016   Lab Results  Component Value Date   HGBA1C 5.9 09/15/2016   No results found for: CHOL, HDL, LDLCALC, LDLDIRECT, TRIG, CHOLHDL  Significant Diagnostic Results in last 30 days:  No results found.  Assessment/Plan Essential hypertension Hx of ankle edema, not apparent, taking Diamox 250mg  daily. HTN, controlled while on Diamox 250mg , Cardizem 120mg  daily, Lisinopril 40mg . blood sugar, diet controlled, CBG <126 fasting in am in the past, thyroid, taking Levothyroxine , last TSH 1.27 09/15/16. COPD, O2, DOE, taking DuoNeb tid, Daliresp 500mg  qd, Seebri bid, Symbicort bid, Prednisone 20mg . anxiety, insomnia, are stable, on Lorazepam bid and Mirtazapine 15mg . Constipation, stable while on Miralax qod. Ambulates with walker, stronger, but apparent SOB at rest, cant complete a whole sentence without taking a break.                 Atrial fibrillation with RVR (HCC) Transient Atrial fibrillation with RVR (HCC): New onset likely due to respiratory failure and tachypnea  when in hospital.  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 Continue to progressing, continue O2, DOE, taking Albuterol Neb tid, q4h prn, Daliresp 500mg  qd, Symbicort bid, Prednisone 20mg .  Constipation Stable, continue MiraLax qod  Hypothyroidism continue  Synthroid . TSH 1.27 09/16/16  Macrocytic anemia Stable, Hgb 12.4 09/15/16  Generalized anxiety disorder Anxious, continue Lorazepam 1mg  tid, Mirtazapine15mg , Morphine 5mg  q2h prn. Observe, adjusting meds to meet comfort measures.  Failure to thrive in adult persists  Edema Not apparent, continue Diamox.      Family/ staff Communication: AL  Labs/tests ordered:  none

## 2017-01-20 NOTE — Assessment & Plan Note (Signed)
Stable, continue MiraLax qod 

## 2017-01-20 NOTE — Assessment & Plan Note (Signed)
Anxious, continue Lorazepam 1mg  tid, Mirtazapine15mg , Morphine 5mg  q2h prn. Observe, adjusting meds to meet comfort measures.

## 2017-01-20 NOTE — Assessment & Plan Note (Signed)
continue  Synthroid 150mcg. TSH 1.27 09/16/16

## 2017-01-20 NOTE — Assessment & Plan Note (Signed)
Hx of ankle edema, not apparent, taking Diamox 250mg  daily. HTN, controlled while on Diamox 250mg , Cardizem 120mg  daily, Lisinopril 40mg . blood sugar, diet controlled, CBG <126 fasting in am in the past, thyroid, taking Levothyroxine 150mcg, last TSH 1.27 09/15/16. COPD, O2, DOE, taking DuoNeb tid, Daliresp 500mg  qd, Seebri bid, Symbicort bid, Prednisone 20mg . anxiety, insomnia, are stable, on Lorazepam bid and Mirtazapine 15mg . Constipation, stable while on Miralax qod. Ambulates with walker, stronger, but apparent SOB at rest, cant complete a whole sentence without taking a break.

## 2017-01-20 NOTE — Assessment & Plan Note (Signed)
persists

## 2017-01-20 NOTE — Assessment & Plan Note (Signed)
Transient Atrial fibrillation with RVR (HCC): New onset likely due to respiratory failure and tachypnea when in hospital.  - Currently improved, continue Cardizem 120 mg daily - Not on anticoagulation, is not candidate for anticoagulation due to severe epistaxis, off ASA too  

## 2017-01-20 NOTE — Assessment & Plan Note (Signed)
Not apparent, continue Diamox.

## 2017-01-20 NOTE — Assessment & Plan Note (Signed)
Stable, Hgb 12.4 09/15/16

## 2017-01-20 NOTE — Assessment & Plan Note (Signed)
Continue to progressing, continue O2, DOE, taking Albuterol Neb tid, q4h prn, Daliresp 500mg  qd, Symbicort bid, Prednisone 20mg .

## 2017-01-31 DIAGNOSIS — J449 Chronic obstructive pulmonary disease, unspecified: Secondary | ICD-10-CM | POA: Diagnosis not present

## 2017-02-06 DIAGNOSIS — J449 Chronic obstructive pulmonary disease, unspecified: Secondary | ICD-10-CM | POA: Diagnosis not present

## 2017-02-18 ENCOUNTER — Other Ambulatory Visit: Payer: Self-pay

## 2017-02-18 NOTE — Patient Outreach (Signed)
[facility] Care Management  02/18/2017  [name] 07/19/1941 [id number]   Medication Adherence call to Mr. Brendan Herring  [name] is showing past due on lisinopril 40 mg under [facility]  [facility],call [facility] and they deliver medication for the patient [facility]  said he is at a assisted living.     Lillia AbedAna Ollison-Moran CPhT Pharmacy Technician [facility] Care Management Direct Dial [phone]  Fax [phone] [email or web address]

## 2017-03-12 ENCOUNTER — Non-Acute Institutional Stay: Payer: Medicare Other | Admitting: Nurse Practitioner

## 2017-03-12 ENCOUNTER — Encounter: Payer: Self-pay | Admitting: Nurse Practitioner

## 2017-03-12 DIAGNOSIS — F411 Generalized anxiety disorder: Secondary | ICD-10-CM

## 2017-03-12 DIAGNOSIS — J9610 Chronic respiratory failure, unspecified whether with hypoxia or hypercapnia: Secondary | ICD-10-CM | POA: Diagnosis not present

## 2017-03-12 DIAGNOSIS — J430 Unilateral pulmonary emphysema [MacLeod's syndrome]: Secondary | ICD-10-CM

## 2017-03-12 DIAGNOSIS — S51819A Laceration without foreign body of unspecified forearm, initial encounter: Secondary | ICD-10-CM | POA: Diagnosis not present

## 2017-03-12 DIAGNOSIS — W19XXXA Unspecified fall, initial encounter: Secondary | ICD-10-CM

## 2017-03-12 NOTE — Assessment & Plan Note (Signed)
Left forearm, no s/s of infection, steri strips will be self exfoliated, it should heal. Observe.

## 2017-03-12 NOTE — Progress Notes (Signed)
[facility]  [name] Room Number: 925 Place of Service:  ALF [phone](13) Provider:  Terrina Docter, Manxie  NP  [name], [name], MD  Patient Care Team: [name], [name], MD as PCP - General (Internal Medicine) Corky CraftsVaranasi, Jayadeep S, MD as Consulting Physician (Cardiology) Mckinley JewelStoneburner, Sara, MD as Consulting Physician (Ophthalmology) Newman Pieseoh, Su, MD as Consulting Physician (Otolaryngology) Michele McalpineNadel, Scott M, MD as Consulting Physician (Pulmonary Disease) Toure Edmonds X, NP as Nurse Practitioner (Internal Medicine)  Extended Emergency Contact Information Primary Emergency Contact: Aaronson,Nathan Address: [address]          [place], [place] Home Phone: [phone] Work Phone: [phone] Relation: Son Secondary Emergency Contact: Blalock,Kimberly Address: [address]          [place], [place] [address] [place] Home Phone: [phone] Work Phone: [phone] Mobile Phone: [phone] Relation: Daughter  Code Status: DNR Goals of care: Advanced Directive information Advanced Directives 03/12/2017  Does Patient Have a Medical Advance Directive? Yes  Type of Estate agentAdvance Directive Healthcare Power of Rein[place]Attorney;Living will;Out of facility DNR (pink MOST or yellow form)  Does patient want to make changes to medical advance directive? No - Patient declined  Copy of Healthcare Power of Attorney in Chart? Yes  Pre-existing out of facility DNR order (yellow form or pink MOST form) Yellow form placed in chart (order not valid for inpatient use)     Chief Complaint  Patient presents with  . Acute Visit    Fall x 2 this weel with SOB    HPI:  Pt is a [age] y.o. male seen today for an acute visit for a large skin tear left arm sustained from falling, approximated with steri-strips, ecchymoses entire of the left arm, no s/s of infection, no decreased ROM. Some ecchymoses right forearm, no open wound.   Hx of end stage of COPD, chronic respiratory  failure, SOB at rest, O2 dependent, under Hospice service, takes Morphine 5mg  q2h prn, Lorazepam bid for SOB and related anxiety. Also takes Mirtazapine 15mg  qhs for sleep/anxiety.     Past Medical History:  Diagnosis Date  . COPD (chronic obstructive pulmonary disease) (HCC)   . Coronary artery disease   . Emphysema   . Glaucoma   . Hypercholesterolemia   . Hypertension   . 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 Thyroid disease   . Unstable gait 07/19/2015  . Weak 07/19/2015   Past Surgical History:  Procedure Laterality Date  . BACK SURGERY  1982  . CATARACT EXTRACTION  1996/2003   bilateral  . CORONARY ANGIOPLASTY WITH STENT PLACEMENT  2009  . TONSILLECTOMY  1943  . VASECTOMY  1970  . WISDOM TOOTH EXTRACTION  1977    Allergies  Allergen Reactions  . Benazepril Other (See Comments)    sensitive to sun light  . Benazepril Hcl     REACTION: sensitive to sunlight  . Levofloxacin     REACTION: tendonitis    Outpatient Encounter Prescriptions as of 03/12/2017  Medication Sig  . acetaZOLAMIDE (DIAMOX) 250 MG tablet Take 1 tablet (250 mg total) by mouth every morning.  [name]. albuterol (PROAIR HFA) 108 (90 BASE) MCG/ACT inhaler Inhale 2 puffs into the lungs every 4 (four) hours as needed for shortness of breath.  [name]. albuterol (PROVENTIL) (2.5 MG/3ML) 0.083% nebulizer solution Take 3 mLs (2.5 mg total) by nebulization 3 (three) times daily. DX J44.1  . brimonidine (ALPHAGAN) 0.2 % ophthalmic solution Place 1 drop into the left eye 2 (two) times  daily.  . budesonide-formoterol (SYMBICORT) 160-4.5 MCG/ACT inhaler Inhale 2 puffs into the lungs 2 (two) times daily.  . Calcium Carbonate-Vitamin D (CALCIUM 600-D) 600-400 MG-UNIT per tablet Take 1 tablet by mouth daily.   . cholecalciferol (VITAMIN D) 1000 UNITS tablet Take 1,000 Units by mouth daily.  [name] diltiazem (CARDIZEM CD) 120 MG 24 hr capsule Take 120 mg by mouth daily.  [name] lactose free nutrition (BOOST) LIQD Take 237 mLs by mouth. One 237 ml  every day at noon  . latanoprost (XALATAN) 0.005 % ophthalmic solution Place 1 drop into both eyes at bedtime.  [name] levothyroxine (SYNTHROID, LEVOTHROID) 150 MCG tablet Take 150 mcg by mouth daily before breakfast.  . lisinopril (PRINIVIL,ZESTRIL) 40 MG tablet Take 40 mg by mouth daily.  [name] LORazepam (ATIVAN) 0.5 MG tablet Take one tablet by mouth twice daily  . mirtazapine (REMERON) 15 MG tablet Take 15 mg by mouth at bedtime.  [name] morphine 20 MG/5ML solution Take 5 mg by mouth every 2 (two) hours as needed for pain. Give at 9am and 4pm  . OXYGEN Inhale into the lungs. Give 3.5L to 4 L per minute via nasal cannula.  . polyethylene glycol (MIRALAX / GLYCOLAX) packet Take 17 g by mouth every other day. Mix in 4-8 oz of fluid.  . predniSONE (DELTASONE) 10 MG tablet Take 20 mg by mouth daily. Take one tablet by mouth alternating with 5 mg daily   . roflumilast (DALIRESP) 500 MCG TABS tablet Take 500 mcg by mouth daily.  [name] saccharomyces boulardii (FLORASTOR) 250 MG capsule Take 250 mg by mouth 2 (two) times daily.   No facility-administered encounter medications on file as of 03/12/2017.     Review of Systems  Constitutional: Positive for fatigue. Negative for appetite change and fever.  HENT: Negative for congestion.   Eyes: Negative for visual disturbance.  Respiratory: Positive for cough and shortness of breath.        SOB at rest, O2 dependent.   Cardiovascular: Positive for palpitations. Negative for leg swelling.  Gastrointestinal: Negative for abdominal distention and abdominal pain.  Genitourinary: Negative for difficulty urinating, dysuria and frequency.  Musculoskeletal: Positive for arthralgias and gait problem. Negative for joint swelling.       Ambulates with walker falling  Skin: Positive for wound. Negative for rash.       A large skin tear left arm, approximated with steri-strips, ecchymoses entire of the left arm, no s/s of infection, no decreased ROM. Some ecchymoses right  forearm, no open wound.   Neurological: Negative for tremors, weakness and headaches.  Hematological: Does not bruise/bleed easily.  Psychiatric/Behavioral: Negative for confusion and sleep disturbance. The patient is not nervous/anxious.     Immunization History  Administered Date(s) Administered  . Influenza Split 05/16/2012, 06/17/2013  . Influenza Whole 05/17/2009, 04/24/2010, 05/18/2011  . Influenza,inj,Quad PF,36+ Mos 04/20/2014  . Influenza-Unspecified 04/18/2015, 06/03/2016  . Pneumococcal Polysaccharide-23 05/17/2009, 04/18/2015   Pertinent  Health Maintenance Due  Topic Date Due  . PNA vac Low Risk Adult (2 of 2 - PCV13) 04/17/2016  . INFLUENZA VACCINE  03/17/2017   Fall Risk  11/21/2015  Falls in the past year? No   Functional Status Survey:    Vitals:   03/12/17 1329  BP: (!) 148/80  Pulse: 78  Resp: 20  Temp: 97.9 F (36.6 C)  SpO2: 98%  Weight: 128 lb 6.4 oz (58.2 kg)  Height: 6\' 1"  (1.854 m)   Body mass index is 16.94 kg/m. Physical Exam  Constitutional: He is oriented to person, place, and time. He appears well-developed and well-nourished.  HENT:  Head: Normocephalic and atraumatic.  Left Ear: External ear normal.  Nose: Nose normal.  Eyes: Pupils are equal, round, and reactive to light. Conjunctivae and EOM are normal.  Neck: Normal range of motion. Neck supple.  Cardiovascular: Normal rate and regular rhythm.   No murmur heard. Pulmonary/Chest: He is in respiratory distress. He has no wheezes. He has no rales.  Decreased breath sound bilaterally. DOE. O2 [place]  Abdominal: Soft. Bowel sounds are normal. He exhibits no distension. There is no tenderness. No hernia.  Musculoskeletal: Normal range of motion. He exhibits no edema or tenderness.  Neurological: He is alert and oriented to person, place, and time. He has normal reflexes. No cranial nerve deficit.  Skin: Skin is warm and dry. No erythema.  A large skin tear left arm, approximated with  steri-strips, ecchymoses entire of the left arm, no s/s of infection, no decreased ROM. Some ecchymoses right forearm, knees, no open wound.    Psychiatric: He has a normal mood and affect. His behavior is normal. Thought content normal.    Labs reviewed:  Recent Labs  09/15/16  NA 146  K 3.8  BUN 24*  CREATININE 1.0    Recent Labs  09/15/16  AST 13*  ALT 12  ALKPHOS 55    Recent Labs  09/15/16  WBC 7.3  HGB 12.4*  HCT 39*  PLT 218   Lab Results  Component Value Date   TSH 1.27 09/15/2016   Lab Results  Component Value Date   HGBA1C 5.9 09/15/2016   No results found for: CHOL, HDL, LDLCALC, LDLDIRECT, TRIG, CHOLHDL  Significant Diagnostic Results in last 30 days:  No results found.  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 Continue to progressing, continue O2, DOE, taking Albuterol Neb tid, q4h prn, Daliresp 500mg  qd, Symbicort bid, Prednisone 20mg , prn Morphine 5mg  q2h, Lorazepam bid.   Chronic respiratory failure (HCC) Continue Hospice service, Morphine, Lorazepam, O2 via [place]  Generalized anxiety disorder Anxious, continue Lorazepam 1mg  tid, Mirtazapine15mg , Morphine 5mg  q2h prn  Skin tear of forearm without complication, initial encounter Left forearm, no s/s of infection, steri strips will be self exfoliated, it should heal. Observe.   Fall The patient was found lying on his stomach in front of his recliner, [place] was lying beside of him, more alert after O2 resumed once he is assisted to bed, Increased frailty, morphine, Lorazepam, Mirtazapine may be contributory, close supervision needed.      Family/ staff Communication: AL  Labs/tests ordered:  none

## 2017-03-12 NOTE — Assessment & Plan Note (Signed)
Anxious, continue Lorazepam 1mg  tid, Mirtazapine15mg , Morphine 5mg  q2h prn

## 2017-03-12 NOTE — Assessment & Plan Note (Signed)
Continue to progressing, continue O2, DOE, taking Albuterol Neb tid, q4h prn, Daliresp 500mg  qd, Symbicort bid, Prednisone 20mg , prn Morphine 5mg  q2h, Lorazepam bid.

## 2017-03-12 NOTE — Assessment & Plan Note (Addendum)
The patient was found lying on his stomach in front of his recliner, Brendan Herring was lying beside of him, more alert after O2 resumed once he is assisted to bed, Increased frailty, morphine, Lorazepam, Mirtazapine may be contributory, close supervision needed.

## 2017-03-12 NOTE — Assessment & Plan Note (Signed)
Continue Hospice service, Morphine, Lorazepam, O2 via [place]

## 2017-03-26 ENCOUNTER — Encounter: Payer: Self-pay | Admitting: Nurse Practitioner

## 2017-03-26 ENCOUNTER — Non-Acute Institutional Stay: Payer: Medicare Other | Admitting: Nurse Practitioner

## 2017-03-26 DIAGNOSIS — J439 Emphysema, unspecified: Secondary | ICD-10-CM

## 2017-03-26 DIAGNOSIS — J9611 Chronic respiratory failure with hypoxia: Secondary | ICD-10-CM

## 2017-03-26 NOTE — Progress Notes (Signed)
Location[facility]  [name] Room Number: 925 Place of Service:  ALF [phone]) Provider:  Tyde Lamison, Manxie  NP  [name], [name], MD  Patient Care Team: [name], [name], MD as PCP - General (Internal Medicine) Corky CraftsVaranasi, Brendan S, MD as Consulting Physician (Cardiology) Mckinley JewelStoneburner, Sara, MD as Consulting Physician (Ophthalmology) Newman Herring, Su, MD as Consulting Physician (Otolaryngology) Michele McalpineNadel, Brendan M, MD as Consulting Physician (Pulmonary Disease) [name], NP as Nurse Practitioner (Internal Medicine)  Extended Emergency Contact Information Primary Emergency Contact: Herring,Brendan Address: [address]          [place], [place] Home Phone: [phone] Work Phone: [phone] Relation: Son Secondary Emergency Contact: Herring,Brendan Address: [address]          [place], [place] [address] [place] Home Phone: [phone] Work Phone: [phone] Mobile Phone: [phone] Relation: Daughter  Code Status: DNR Goals of care: Advanced Directive information Advanced Directives 03/26/2017  Does Patient Have a Medical Advance Directive? Yes  Type of Estate agentAdvance Directive Healthcare Power of [place];Living will;Out of facility DNR (pink MOST or yellow form)  Does patient want to make changes to medical advance directive? No - Patient declined  Copy of Healthcare Power of Attorney in Chart? Yes  Pre-existing out of facility DNR order (yellow form or pink MOST form) Yellow form placed in chart (order not valid for inpatient use)     Chief Complaint  Patient presents with  . Acute Visit    HPI:  Pt is a 76 y.o. male seen today for an acute visit for worsening of chronic respiratory failure, the patient smiles when talked to, but can't speak more than 3 words due to his severe SOB,  O2 dependent, under Hospice service, started Morphine 5mg  tid and q2h prn 03/25/17, continued Lorazepam bid for SOB and related anxiety. Also  discontinued Vit D, Daliresp, Florastor, Synthroid, ProAir, Symbicort since his plan of care is comfort only presently. He ate oat meal for breakfast, bedrest upon my visit. He smiles and appeared peaceful in bed.     Past Medical History:  Diagnosis Date  . COPD (chronic obstructive pulmonary disease) (HCC)   . Coronary artery disease   . Emphysema   . Glaucoma   . Hypercholesterolemia   . Hypertension   . 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 Thyroid disease   . Unstable gait 07/19/2015  . Weak 07/19/2015   Past Surgical History:  Procedure Laterality Date  . BACK SURGERY  1982  . CATARACT EXTRACTION  1996/2003   bilateral  . CORONARY ANGIOPLASTY WITH STENT PLACEMENT  2009  . TONSILLECTOMY  1943  . VASECTOMY  1970  . WISDOM TOOTH EXTRACTION  1977    Allergies  Allergen Reactions  . Benazepril Other (See Comments)    sensitive to sun light  . Benazepril Hcl     REACTION: sensitive to sunlight  . Levofloxacin     REACTION: tendonitis    Outpatient Encounter Prescriptions as of 03/26/2017  Medication Sig  . acetaZOLAMIDE (DIAMOX) 250 MG tablet Take 1 tablet (250 mg total) by mouth every morning.  [name]. albuterol (PROAIR HFA) 108 (90 BASE) MCG/ACT inhaler Inhale 2 puffs into the lungs every 4 (four) hours as needed for shortness of breath.  [name]. albuterol (PROVENTIL) (2.5 MG/3ML) 0.083% nebulizer solution Take 3 mLs (2.5 mg total) by nebulization 3 (three) times daily. DX J44.1  . brimonidine (ALPHAGAN) 0.2 % ophthalmic solution Place 1 drop into the left eye 2 (two) times  daily.  . diltiazem (CARDIZEM CD) 120 MG 24 hr capsule Take 120 mg by mouth daily.  [name] lactose free nutrition (BOOST) LIQD Take 237 mLs by mouth. One 237 ml every day at noon  . latanoprost (XALATAN) 0.005 % ophthalmic solution Place 1 drop into both eyes at bedtime.  [name] lisinopril (PRINIVIL,ZESTRIL) 40 MG tablet Take 40 mg by mouth daily.  [name] LORazepam (ATIVAN) 0.5 MG tablet Take one tablet by mouth twice daily  .  mirtazapine (REMERON) 15 MG tablet Take 15 mg by mouth at bedtime.  [name] morphine 20 MG/5ML solution Take 5 mg by mouth every 2 (two) hours as needed for pain. Give at 9am and 4pm  . OXYGEN Inhale into the lungs. Give 3.5L to 4 L per minute via nasal cannula.  . polyethylene glycol (MIRALAX / GLYCOLAX) packet Take 17 g by mouth every other day. Mix in 4-8 oz of fluid.  . predniSONE (DELTASONE) 10 MG tablet Take 20 mg by mouth daily. Take one tablet by mouth alternating with 5 mg daily   . [DISCONTINUED] budesonide-formoterol (SYMBICORT) 160-4.5 MCG/ACT inhaler Inhale 2 puffs into the lungs 2 (two) times daily.  . [DISCONTINUED] Calcium Carbonate-Vitamin D (CALCIUM 600-D) 600-400 MG-UNIT per tablet Take 1 tablet by mouth daily.   . [DISCONTINUED] cholecalciferol (VITAMIN D) 1000 UNITS tablet Take 1,000 Units by mouth daily.  . [DISCONTINUED] levothyroxine (SYNTHROID, LEVOTHROID) 150 MCG tablet Take 150 mcg by mouth daily before breakfast.  . [DISCONTINUED] roflumilast (DALIRESP) 500 MCG TABS tablet Take 500 mcg by mouth daily.  . [DISCONTINUED] saccharomyces boulardii (FLORASTOR) 250 MG capsule Take 250 mg by mouth 2 (two) times daily.   No facility-administered encounter medications on file as of 03/26/2017.    ROS was provided by the patient with assistance of staff Review of Systems  Constitutional: Positive for activity change and appetite change.  Respiratory: Positive for cough and shortness of breath. Negative for apnea, choking, chest tightness, wheezing and stridor.        SOB at rest, O2 dependent.   Genitourinary: Negative for difficulty urinating.       Incontinent of urine  Skin: Positive for wound.       A large skin tear left arm, healing  Psychiatric/Behavioral: Negative for agitation and behavioral problems.    Immunization History  Administered Date(s) Administered  . Influenza Split 05/16/2012, 06/17/2013  . Influenza Whole 05/17/2009, 04/24/2010, 05/18/2011  .  Influenza,inj,Quad PF,36+ Mos 04/20/2014  . Influenza-Unspecified 04/18/2015, 06/03/2016  . Pneumococcal Polysaccharide-23 05/17/2009, 04/18/2015   Pertinent  Health Maintenance Due  Topic Date Due  . PNA vac Low Risk Adult (2 of 2 - PCV13) 04/17/2016  . INFLUENZA VACCINE  03/17/2017   Fall Risk  11/21/2015  Falls in the past year? No   Functional Status Survey:    Vitals:   03/26/17 1644  BP: 132/84  Pulse: 78  Resp: 20  Temp: (!) 97.2 F (36.2 C)  SpO2: 98%   There is no height or weight on file to calculate BMI. Physical Exam  HENT:  Nose: Nose normal.  Eyes: Pupils are equal, round, and reactive to light. Conjunctivae and EOM are normal.  Neck: Neck supple.  Cardiovascular:  No murmur heard. Pulmonary/Chest: He is in respiratory distress. He has no wheezes. He has no rales.  Decreased breath sound bilaterally. Labored breathing. O2 [place]  Abdominal: Bowel sounds are normal. There is no tenderness. No hernia.  Neurological: He is alert.  Skin: Skin is warm and dry.  A large skin tear left arm, healing    Psychiatric: He has a normal mood and affect.  Smiles when talked to    Labs reviewed:  Recent Labs  09/15/16  NA 146  K 3.8  BUN 24*  CREATININE 1.0    Recent Labs  09/15/16  AST 13*  ALT 12  ALKPHOS 55    Recent Labs  09/15/16  WBC 7.3  HGB 12.4*  HCT 39*  PLT 218   Lab Results  Component Value Date   TSH 1.27 09/15/2016   Lab Results  Component Value Date   HGBA1C 5.9 09/15/2016   No results found for: CHOL, HDL, LDLCALC, LDLDIRECT, TRIG, CHOLHDL  Significant Diagnostic Results in last 30 days:  No results found.  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 End stage of COPD, Morphine, Lorazepam,  Prednisone, O2, Neb for comfort purpose.      Family/ staff Communication: plan of care reviewed with the patient and charge nurse.  Labs/tests ordered:  None  Time spend 25 minutes

## 2017-03-26 NOTE — Assessment & Plan Note (Signed)
End stage of COPD, Morphine, Lorazepam, Prednisone, O2, Neb for comfort purpose.

## 2017-03-26 NOTE — Progress Notes (Signed)
Location:  [facility] Room Number: [id number] Place of Service:  ALF [phone]) Provider:  Mast, Manxie  NP  Oneal Grout, MD  Patient Care Team: Oneal Grout, MD as PCP - General (Internal Medicine) [name], MD as Consulting Physician (Cardiology) Mckinley Jewel, MD as Consulting Physician (Ophthalmology) Newman Pies, MD as Consulting Physician (Otolaryngology) Michele Mcalpine, MD as Consulting Physician (Pulmonary Disease) Mast, Man X, NP as Nurse Practitioner (Internal Medicine)  Extended Emergency Contact Information Primary Emergency Contact: Leone,Nathan Address: [address]          [place], [place] Home Phone: [phone] Work Phone: [phone] Relation: Son Secondary Emergency Contact: Justiss,Kimberly Address: [address] [place], [place] [address] [place] Home Phone: [phone] Work Phone: [phone] Mobile Phone: [phone] Relation: Daughter  Code Status:  DNR Goals of care: Advanced Directive information Advanced Directives 03/26/2017  Does Patient Have a Medical Advance Directive? Yes  Type of Estate agent of [place];Living will;Out of facility DNR (pink MOST or yellow form)  Does patient want to make changes to medical advance directive? No - Patient declined  Copy of Healthcare Power of Attorney in Chart? Yes  Pre-existing out of facility DNR order (yellow form or pink MOST form) Yellow form placed in chart (order not valid for inpatient use)     Chief Complaint  Patient presents with  . Acute Visit    HPI:  Pt is a 76 y.o. male seen today for an acute visit for    Past Medical History:  Diagnosis Date  . COPD (chronic obstructive pulmonary disease) (HCC)   . Coronary artery disease   . Emphysema   . Glaucoma   . Hypercholesterolemia   . Hypertension   . 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 Thyroid disease   . Unstable gait 07/19/2015    . Weak 07/19/2015   Past Surgical History:  Procedure Laterality Date  . BACK SURGERY  1982  . CATARACT EXTRACTION  1996/2003   bilateral  . CORONARY ANGIOPLASTY WITH STENT PLACEMENT  2009  . TONSILLECTOMY  1943  . VASECTOMY  1970  . WISDOM TOOTH EXTRACTION  1977    Allergies  Allergen Reactions  . Benazepril Other (See Comments)    sensitive to sun light  . Benazepril Hcl     REACTION: sensitive to sunlight  . Levofloxacin     REACTION: tendonitis    Outpatient Encounter Prescriptions as of 03/26/2017  Medication Sig  . acetaZOLAMIDE (DIAMOX) 250 MG tablet Take 1 tablet (250 mg total) by mouth every morning.  [name] albuterol (PROAIR HFA) 108 (90 BASE) MCG/ACT inhaler Inhale 2 puffs into the lungs every 4 (four) hours as needed for shortness of breath.  [name] albuterol (PROVENTIL) (2.5 MG/3ML) 0.083% nebulizer solution Take 3 mLs (2.5 mg total) by nebulization 3 (three) times daily. DX J44.1  . brimonidine (ALPHAGAN) 0.2 % ophthalmic solution Place 1 drop into the left eye 2 (two) times daily.  [name] diltiazem (CARDIZEM CD) 120 MG 24 hr capsule Take 120 mg by mouth daily.  [name] lactose free nutrition (BOOST) LIQD Take 237 mLs by mouth. One 237 ml every day at noon  . latanoprost (XALATAN) 0.005 % ophthalmic solution Place 1 drop into both eyes at bedtime.  [name] lisinopril (PRINIVIL,ZESTRIL) 40 MG tablet Take 40 mg by mouth daily.  [name] LORazepam (ATIVAN) 0.5 MG tablet Take one tablet by mouth twice daily  .  mirtazapine (REMERON) 15 MG tablet Take 15 mg by mouth at bedtime.  [name]. morphine 20 MG/5ML solution Take 5 mg by mouth every 2 (two) hours as needed for pain. Give at 9am and 4pm  . OXYGEN Inhale into the lungs. Give 3.5L to 4 L per minute via nasal cannula.  . polyethylene glycol (MIRALAX / GLYCOLAX) packet Take 17 g by mouth every other day. Mix in 4-8 oz of fluid.  . predniSONE (DELTASONE) 10 MG tablet Take 20 mg by mouth daily. Take one tablet by mouth alternating with 5 mg daily   .  [DISCONTINUED] budesonide-formoterol (SYMBICORT) 160-4.5 MCG/ACT inhaler Inhale 2 puffs into the lungs 2 (two) times daily.  . [DISCONTINUED] Calcium Carbonate-Vitamin D (CALCIUM 600-D) 600-400 MG-UNIT per tablet Take 1 tablet by mouth daily.   . [DISCONTINUED] cholecalciferol (VITAMIN D) 1000 UNITS tablet Take 1,000 Units by mouth daily.  . [DISCONTINUED] levothyroxine (SYNTHROID, LEVOTHROID) 150 MCG tablet Take 150 mcg by mouth daily before breakfast.  . [DISCONTINUED] roflumilast (DALIRESP) 500 MCG TABS tablet Take 500 mcg by mouth daily.  . [DISCONTINUED] saccharomyces boulardii (FLORASTOR) 250 MG capsule Take 250 mg by mouth 2 (two) times daily.   No facility-administered encounter medications on file as of 03/26/2017.     Review of Systems  Immunization History  Administered Date(s) Administered  . Influenza Split 05/16/2012, 06/17/2013  . Influenza Whole 05/17/2009, 04/24/2010, 05/18/2011  . Influenza,inj,Quad PF,36+ Mos 04/20/2014  . Influenza-Unspecified 04/18/2015, 06/03/2016  . Pneumococcal Polysaccharide-23 05/17/2009, 04/18/2015   Pertinent  Health Maintenance Due  Topic Date Due  . PNA vac Low Risk Adult (2 of 2 - PCV13) 04/17/2016  . INFLUENZA VACCINE  03/17/2017   Fall Risk  11/21/2015  Falls in the past year? No   Functional Status Survey:    Vitals:   03/26/17 1644  BP: 132/84  Pulse: 78  Resp: 20  Temp: (!) 97.2 F (36.2 C)  SpO2: 98%   There is no height or weight on file to calculate BMI. Physical Exam  Labs reviewed:  Recent Labs  09/15/16  NA 146  K 3.8  BUN 24*  CREATININE 1.0    Recent Labs  09/15/16  AST 13*  ALT 12  ALKPHOS 55    Recent Labs  09/15/16  WBC 7.3  HGB 12.4*  HCT 39*  PLT 218   Lab Results  Component Value Date   TSH 1.27 09/15/2016   Lab Results  Component Value Date   HGBA1C 5.9 09/15/2016   No results found for: CHOL, HDL, LDLCALC, LDLDIRECT, TRIG, CHOLHDL  Significant Diagnostic Results in last 30  days:  No results found.  Assessment/Plan There are no diagnoses linked to this encounter.   Family/ staff Communication:   Labs/tests ordered:

## 2017-03-26 NOTE — Assessment & Plan Note (Signed)
Continue to deteriorating, comfort measures are provided with scheduled Morphine 5mg  tid and q2h prn, Lorazepam bid, continue O2 via [place] to provide comfort

## 2017-04-30 ENCOUNTER — Non-Acute Institutional Stay: Payer: Medicare Other | Admitting: Internal Medicine

## 2017-04-30 ENCOUNTER — Encounter: Payer: Self-pay | Admitting: Internal Medicine

## 2017-04-30 DIAGNOSIS — F419 Anxiety disorder, unspecified: Secondary | ICD-10-CM | POA: Diagnosis not present

## 2017-04-30 DIAGNOSIS — S41112S Laceration without foreign body of left upper arm, sequela: Secondary | ICD-10-CM

## 2017-04-30 DIAGNOSIS — J9611 Chronic respiratory failure with hypoxia: Secondary | ICD-10-CM

## 2017-04-30 DIAGNOSIS — R5381 Other malaise: Secondary | ICD-10-CM

## 2017-04-30 DIAGNOSIS — J439 Emphysema, unspecified: Secondary | ICD-10-CM | POA: Diagnosis not present

## 2017-04-30 DIAGNOSIS — I480 Paroxysmal atrial fibrillation: Secondary | ICD-10-CM

## 2017-04-30 DIAGNOSIS — E43 Unspecified severe protein-calorie malnutrition: Secondary | ICD-10-CM | POA: Diagnosis not present

## 2017-04-30 NOTE — Progress Notes (Signed)
Location:  [facility] Room Number: [id number] [phone] Place of Service:  ALF [phone]) Provider:  Oneal Grout MD  Oneal Grout, MD  Patient Care Team: Oneal Grout, MD as PCP - General (Internal Medicine) [name], MD as Consulting Physician (Cardiology) Mckinley Jewel, MD as Consulting Physician (Ophthalmology) Newman Pies, MD as Consulting Physician (Otolaryngology) Michele Mcalpine, MD as Consulting Physician (Pulmonary Disease) Mast, Man X, NP as Nurse Practitioner (Internal Medicine)  Extended Emergency Contact Information Primary Emergency Contact: Agresta,Nathan Address: [address]          [place], [place] Home Phone: [phone] Work Phone: [phone] Relation: Son Secondary Emergency Contact: Hohler,Kimberly Address: [address] [place], [place] [address] [place] Home Phone: [phone] Work Phone: [phone] Mobile Phone: [phone] Relation: Daughter  Code Status:  DNR Goals of care: Advanced Directive information Advanced Directives 04/30/2017  Does Patient Have a Medical Advance Directive? Yes  Type of Estate agent of [place];Living will;Out of facility DNR (pink MOST or yellow form)  Does patient want to make changes to medical advance directive? No - Patient declined  Copy of Healthcare Power of Attorney in Chart? Yes  Pre-existing out of facility DNR order (yellow form or pink MOST form) Yellow form placed in chart (order not valid for inpatient use)     Chief Complaint  Patient presents with  . Medical Management of Chronic Issues    Routine Visit     HPI:  Pt is a 76 y.o. male seen today for medical management of chronic diseases. He is seen in his room with his daughter at bedside. He is now also recieving hospice services due to worsening chronic respiratory failure with goals of care for comfort. He appears comfortable in his room. He ate  his lunch good today. He needs assistance with feeding. He is mostly in his bed. He is tolerating oxygen well and is currently on 4 l o2. He had 2 falls over the 04/26/17 and 04/27/17, sustained skin tear to left arm. He denies pain at present. He was having more trouble breathing per daughter 1-2 weeks back and now has improved some.    Past Medical History:  Diagnosis Date  . COPD (chronic obstructive pulmonary disease) (HCC)   . Coronary artery disease   . Emphysema   . Glaucoma   . Hypercholesterolemia   . Hypertension   . 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 Thyroid disease   . Unstable gait 07/19/2015  . Weak 07/19/2015   Past Surgical History:  Procedure Laterality Date  . BACK SURGERY  1982  . CATARACT EXTRACTION  1996/2003   bilateral  . CORONARY ANGIOPLASTY WITH STENT PLACEMENT  2009  . TONSILLECTOMY  1943  . VASECTOMY  1970  . WISDOM TOOTH EXTRACTION  1977    Allergies  Allergen Reactions  . Benazepril Other (See Comments)    sensitive to sun light  . Benazepril Hcl     REACTION: sensitive to sunlight  . Levofloxacin     REACTION: tendonitis    Outpatient Encounter Prescriptions as of 04/30/2017  Medication Sig  . acetaZOLAMIDE (DIAMOX) 250 MG tablet Take 1 tablet (250 mg total) by mouth every morning.  . bisacodyl (DULCOLAX) 10 MG suppository Place 10 mg rectally as needed for moderate constipation.  . brimonidine (ALPHAGAN) 0.2 % ophthalmic solution Place 1 drop into the left eye 2 (two) times daily.  [name]  diltiazem (CARDIZEM CD) 120 MG 24 hr capsule Take 120 mg by mouth daily.  [name] ipratropium-albuterol (DUONEB) 0.5-2.5 (3) MG/3ML SOLN Take 3 mLs by nebulization 3 (three) times daily.  [name] lactose free nutrition (BOOST) LIQD Take 237 mLs by mouth 2 (two) times daily.   [name] latanoprost (XALATAN) 0.005 % ophthalmic solution Place 1 drop into both eyes at bedtime.  [name] lisinopril (PRINIVIL,ZESTRIL) 40 MG tablet Take 40 mg by mouth daily.  [name] LORazepam (ATIVAN) 1 MG tablet Take 1 mg  by mouth 4 (four) times daily.  [name] LORazepam (ATIVAN) 1 MG tablet Take 1 mg by mouth every 4 (four) hours as needed for anxiety (in addition to the scheduled dose).  . mirtazapine (REMERON) 15 MG tablet Take 15 mg by mouth at bedtime.  [name] morphine 20 MG/5ML solution Take 5 mg by mouth every 2 (two) hours as needed for pain.   . OXYGEN Inhale 4 L into the lungs continuous.   . polyethylene glycol (MIRALAX / GLYCOLAX) packet Take 17 g by mouth every other day.   . predniSONE (DELTASONE) 10 MG tablet Take 20 mg by mouth daily.   . sodium chloride (OCEAN) 0.65 % SOLN nasal spray Place 2-3 sprays into both nostrils as needed for congestion.  . [DISCONTINUED] albuterol (PROAIR HFA) 108 (90 BASE) MCG/ACT inhaler Inhale 2 puffs into the lungs every 4 (four) hours as needed for shortness of breath.  . [DISCONTINUED] albuterol (PROVENTIL) (2.5 MG/3ML) 0.083% nebulizer solution Take 3 mLs (2.5 mg total) by nebulization 3 (three) times daily. DX J44.1  . [DISCONTINUED] LORazepam (ATIVAN) 0.5 MG tablet Take one tablet by mouth twice daily   No facility-administered encounter medications on file as of 04/30/2017.     Review of Systems  Constitutional: Positive for appetite change. Negative for chills and fever.  HENT: Positive for hearing loss. Negative for congestion, mouth sores, sore throat and trouble swallowing.   Respiratory: Positive for cough, shortness of breath and wheezing.   Cardiovascular: Negative for chest pain and palpitations.  Gastrointestinal: Negative for abdominal pain, nausea and vomiting.  Genitourinary:       Has urinary incontinence  Neurological: Negative for dizziness and headaches.  Psychiatric/Behavioral: Negative for behavioral problems.    Immunization History  Administered Date(s) Administered  . Influenza Split 05/16/2012, 06/17/2013  . Influenza Whole 05/17/2009, 04/24/2010, 05/18/2011  . Influenza,inj,Quad PF,6+ Mos 04/20/2014  . Influenza-Unspecified 04/18/2015,  06/03/2016  . Pneumococcal Polysaccharide-23 05/17/2009, 04/18/2015   Pertinent  Health Maintenance Due  Topic Date Due  . PNA vac Low Risk Adult (2 of 2 - PCV13) 05/17/2017 (Originally 04/17/2016)  . INFLUENZA VACCINE  06/17/2017 (Originally 03/17/2017)   Fall Risk  11/21/2015  Falls in the past year? No   Functional Status Survey:    Vitals:   04/30/17 1036  BP: 134/84  Pulse: 96  Resp: 20  Temp: (!) 96.8 F (36 C)  TempSrc: Oral  Weight: 128 lb 6.4 oz (58.2 kg)  Height:  (1.854 m)   Body mass index is 16.94 kg/m. Physical Exam  Constitutional:  Thin built, frail, in no distress  HENT:  Head: Normocephalic and atraumatic.  Mouth/Throat: Oropharynx is clear and moist.  Eyes: Pupils are equal, round, and reactive to light. Conjunctivae and EOM are normal.  Neck: Normal range of motion. Neck supple.  Cardiovascular: Normal rate and regular rhythm.   Pulmonary/Chest: No respiratory distress. He has no wheezes. He has no rales.  Decreased air entry to lung bases  Abdominal: Soft.  Bowel sounds are normal. He exhibits no distension. There is no tenderness.  Musculoskeletal: He exhibits no edema.  Can move all 4 extremities, generalized weakness, needs 1-2 person assistance with transfer.   Lymphadenopathy:    He has no cervical adenopathy.  Neurological: He is alert.  Skin: Skin is warm and dry. No rash noted. He is not diaphoretic.  Psychiatric: He has a normal mood and affect.    Labs reviewed:  Recent Labs  09/15/16  NA 146  K 3.8  BUN 24*  CREATININE 1.0    Recent Labs  09/15/16  AST 13*  ALT 12  ALKPHOS 55    Recent Labs  09/15/16  WBC 7.3  HGB 12.4*  HCT 39*  PLT 218   Lab Results  Component Value Date   TSH 1.27 09/15/2016   Lab Results  Component Value Date   HGBA1C 5.9 09/15/2016   No results found for: CHOL, HDL, LDLCALC, LDLDIRECT, TRIG, CHOLHDL  Significant Diagnostic Results in last 30 days:  No results  found.  Assessment/Plan  Chronic respiratory failure With COPD, advanced, poor prognosis, seen by hsopice service. Currently on prn morhine 5 mg q2h prn dyspnea and pain, bronchodilators  Copd Continue o2 4 l by nasal canula and wean to 3l if o2 sat >90%, continue duoneb tid, continue morphine prn for dyspnea and pain. Continue daily prednisone.   Severe protein calorie malnutrition Continue nutritional supplement as tolerated and encourage po intake. Assist with feeding.   Physical deconditioning With failure to thrive with chronic respiratory failure, advanced COPD and other medical co-morbidities, poor prognosis, continue hospice service. Requiring increased assistance with his ADLs. Will recommend transfer to SNF for transition of care due to increased assistance with his ADLs.   Anxiety With air hunger from dyspnea with his severe copd, continue lorazepam 1 mg q4h prn along with scheduled ativan dosing and monitor  afib Controlled. Continue his diltiazem and monitor. Not on anticoagulation with his fall risk  Left arm skin tear Has dressing in place, frail skin with being on chronic prednisone   Family/ staff Communication: reviewed care plan with pt, his daughter and charge nurse  Labs/tests ordered:  none   Oneal Grout, MD Internal Medicine [facility] [address] [place], [place] [address] Cell Phone (Monday-Friday 8 am - 5 pm): [phone] On Call: [phone] and follow prompts after 5 pm and on weekends Office Phone: [phone] Office Fax: [phone]

## 2017-05-01 DIAGNOSIS — I480 Paroxysmal atrial fibrillation: Secondary | ICD-10-CM | POA: Insufficient documentation

## 2017-05-07 ENCOUNTER — Non-Acute Institutional Stay: Payer: Medicare Other

## 2017-05-07 DIAGNOSIS — Z0000 Encounter for general adult medical examination without abnormal findings: Secondary | ICD-10-CM

## 2017-05-07 NOTE — Progress Notes (Signed)
Subjective:   Brendan Herring is a 76 y.o. male who presents for an Initial Medicare Annual Wellness Visit at [facility]; incapacitated patient unable to answer questions appropriately, hospice patient   Objective:    Today's Vitals   05/07/17 1130  BP: 130/70  Pulse: 77  Temp: (!) 97.5 F (36.4 C)  TempSrc: Oral  SpO2: 97%  Weight: 128 lb (58.1 kg)  Height:  (1.854 m)   Body mass index is 16.89 kg/m.  Current Medications (verified) Outpatient Encounter Prescriptions as of 05/07/2017  Medication Sig  . acetaZOLAMIDE (DIAMOX) 250 MG tablet Take 1 tablet (250 mg total) by mouth every morning.  . bisacodyl (DULCOLAX) 10 MG suppository Place 10 mg rectally as needed for moderate constipation.  . brimonidine (ALPHAGAN) 0.2 % ophthalmic solution Place 1 drop into the left eye 2 (two) times daily.  [name] diltiazem (CARDIZEM CD) 120 MG 24 hr capsule Take 120 mg by mouth daily.  [name] ipratropium-albuterol (DUONEB) 0.5-2.5 (3) MG/3ML SOLN Take 3 mLs by nebulization 3 (three) times daily.  [name] lactose free nutrition (BOOST) LIQD Take 237 mLs by mouth 2 (two) times daily.   [name] latanoprost (XALATAN) 0.005 % ophthalmic solution Place 1 drop into both eyes at bedtime.  [name] lisinopril (PRINIVIL,ZESTRIL) 40 MG tablet Take 40 mg by mouth daily.  [name] LORazepam (ATIVAN) 1 MG tablet Take 1 mg by mouth 4 (four) times daily.  [name] LORazepam (ATIVAN) 1 MG tablet Take 1 mg by mouth every 4 (four) hours as needed for anxiety (in addition to the scheduled dose).  . mirtazapine (REMERON) 15 MG tablet Take 15 mg by mouth at bedtime.  [name] morphine 20 MG/5ML solution Take 5 mg by mouth every 2 (two) hours as needed for pain.   . OXYGEN Inhale 4 L into the lungs continuous.   . polyethylene glycol (MIRALAX / GLYCOLAX) packet Take 17 g by mouth every other day.   . predniSONE (DELTASONE) 10 MG tablet Take 20 mg by mouth daily.   . sodium chloride (OCEAN) 0.65 % SOLN nasal spray Place 2-3 sprays  into both nostrils as needed for congestion.   No facility-administered encounter medications on file as of 05/07/2017.     Allergies (verified) Benazepril; Benazepril hcl; and Levofloxacin   History: Past Medical History:  Diagnosis Date  . COPD (chronic obstructive pulmonary disease) (HCC)   . Coronary artery disease   . Emphysema   . Glaucoma   . Hypercholesterolemia   . Hypertension   . 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 Thyroid disease   . Unstable gait 07/19/2015  . Weak 07/19/2015   Past Surgical History:  Procedure Laterality Date  . BACK SURGERY  1982  . CATARACT EXTRACTION  1996/2003   bilateral  . CORONARY ANGIOPLASTY WITH STENT PLACEMENT  2009  . TONSILLECTOMY  1943  . VASECTOMY  1970  . WISDOM TOOTH EXTRACTION  1977   Family History  Problem Relation Age of Onset  . Emphysema Father   . Heart failure Father   . Stroke Father   . Heart disease Father   . Heart failure Brother   . Cancer Brother   . Heart disease Mother   . Thyroid disease Daughter   . Hypertension Son   . Thyroid disease Son    Social History   Occupational History  . retired from [employer]    Social History Main Topics  . Smoking status: Former Smoker    Packs/day:  1.00    Years: 35.00    Types: Cigarettes    Quit date: 08/17/1994  . Smokeless tobacco: Never Used  . Alcohol use No  . Drug use: No  . Sexual activity: Not Currently   Tobacco Counseling Counseling given: Not Answered   Activities of Daily Living In your present state of health, do you have any difficulty performing the following activities: 05/07/2017  Hearing? Y  Vision? N  Difficulty concentrating or making decisions? Y  Walking or climbing stairs? Y  Dressing or bathing? Y  Doing errands, shopping? Y  Preparing Food and eating ? Y  Using the Toilet? Y  In the past six months, have you accidently leaked urine? Y  Do you have problems with loss of bowel control? Y  Managing your  Medications? Y  Managing your Finances? Y  Housekeeping or managing your Housekeeping? Y  Some recent data might be hidden    Immunizations and Health Maintenance Immunization History  Administered Date(s) Administered  . Influenza Split 05/16/2012, 06/17/2013  . Influenza Whole 05/17/2009, 04/24/2010, 05/18/2011  . Influenza,inj,Quad PF,6+ Mos 04/20/2014  . Influenza-Unspecified 04/18/2015, 06/03/2016  . Pneumococcal Polysaccharide-23 05/17/2009, 04/18/2015   There are no preventive care reminders to display for this patient.  Patient Care Team: Oneal Grout, MD as PCP - General (Internal Medicine) [name], MD as Consulting Physician (Cardiology) Mckinley Jewel, MD as Consulting Physician (Ophthalmology) Newman Pies, MD as Consulting Physician (Otolaryngology) Michele Mcalpine, MD as Consulting Physician (Pulmonary Disease) Mast, Man X, NP as Nurse Practitioner (Internal Medicine)  Indicate any recent Medical Services you may have received from other than [facility] providers in the past year (date may be approximate).    Assessment:   This is a routine wellness examination for [place].   Hearing/Vision screen No exam data present  Dietary issues and exercise activities discussed: Current Exercise Habits: The patient does not participate in regular exercise at present, Exercise limited by: orthopedic condition(s)  Goals    None     Depression Screen PHQ 2/9 Scores 05/07/2017 11/21/2015  PHQ - 2 Score 0 0    Fall Risk Fall Risk  05/07/2017 11/21/2015  Falls in the past year? No No    Cognitive Function: MMSE - Mini Mental State Exam 05/07/2017  Orientation to time 0  Orientation to Place 4  Registration 3  Attention/ Calculation 0  Recall 0  Language- name 2 objects 2  Language- repeat 1  Language- follow 3 step command 3  Language- read & follow direction 1  Write a sentence 0  Copy design 0  Total score 14        Screening Tests Health Maintenance    Topic Date Due  . PNA vac Low Risk Adult (2 of 2 - PCV13) 05/17/2017 (Originally 04/17/2016)  . INFLUENZA VACCINE  06/17/2017 (Originally 03/17/2017)  . TETANUS/TDAP  08/17/2018 (Originally 12/23/1959)        Plan:    I have personally reviewed and addressed the Medicare Annual Wellness questionnaire and have noted the following in the patient's chart:  A. Medical and social history B. Use of alcohol, tobacco or illicit drugs  C. Current medications and supplements D. Functional ability and status E.  Nutritional status F.  Physical activity G. Advance directives H. List of other physicians I.  Hospitalizations, surgeries, and ER visits in previous 12 months J.  Vitals K. Screenings to include hearing, vision, cognitive, depression L. Referrals and appointments - none  In addition, I am  unable to review and discuss with incapacitated patient certain preventive protocols, quality metrics, and best practice recommendations. A written personalized care plan for preventive services as well as general preventive health recommendations were provided to patient.   See attached scanned questionnaire for additional information.   Signed,   Annetta Maw, RN Nurse Health Advisor   Quick Notes   Health Maintenance: Flu vaccine due, not ordered patient is hospice     Abnormal Screen: MMSE 14/30, did not pass clock     Patient Concerns: None     Nurse Concerns: None

## 2017-05-07 NOTE — Patient Instructions (Signed)
Brendan Herring , Thank you for taking time to come for your Medicare Wellness Visit. I appreciate your ongoing commitment to your health goals. Please review the following plan we discussed and let me know if I can assist you in the future.   Screening recommendations/referrals: Colonoscopy excluded, pt is over age 78 Recommended yearly ophthalmology/optometry visit for glaucoma screening and checkup Recommended yearly dental visit for hygiene and checkup  Vaccinations: Influenza vaccine due, not ordered patient is hospice Pneumococcal vaccine up to date Tdap vaccine up to date Shingles vaccine not in records    Advanced directives: In Chart  Conditions/risks identified: Hospice  Next appointment: Dr. Glade Lloyd makes rounds  Preventive Care 65 Years and Older, Male Preventive care refers to lifestyle choices and visits with your health care provider that can promote health and wellness. What does preventive care include?  A yearly physical exam. This is also called an annual well check.  Dental exams once or twice a year.  Routine eye exams. Ask your health care provider how often you should have your eyes checked.  Personal lifestyle choices, including:  Daily care of your teeth and gums.  Regular physical activity.  Eating a healthy diet.  Avoiding tobacco and drug use.  Limiting alcohol use.  Practicing safe sex.  Taking low doses of aspirin every day.  Taking vitamin and mineral supplements as recommended by your health care provider. What happens during an annual well check? The services and screenings done by your health care provider during your annual well check will depend on your age, overall health, lifestyle risk factors, and family history of disease. Counseling  Your health care provider may ask you questions about your:  Alcohol use.  Tobacco use.  Drug use.  Emotional well-being.  Home and relationship well-being.  Sexual activity.  Eating  habits.  History of falls.  Memory and ability to understand (cognition).  Work and work Astronomer. Screening  You may have the following tests or measurements:  Height, weight, and BMI.  Blood pressure.  Lipid and cholesterol levels. These may be checked every 5 years, or more frequently if you are over 72 years old.  Skin check.  Lung cancer screening. You may have this screening every year starting at age 55 if you have a 30-pack-year history of smoking and currently smoke or have quit within the past 15 years.  Fecal occult blood test (FOBT) of the stool. You may have this test every year starting at age 37.  Flexible sigmoidoscopy or colonoscopy. You may have a sigmoidoscopy every 5 years or a colonoscopy every 10 years starting at age 76.  Prostate cancer screening. Recommendations will vary depending on your family history and other risks.  Hepatitis C blood test.  Hepatitis B blood test.  Sexually transmitted disease (STD) testing.  Diabetes screening. This is done by checking your blood sugar (glucose) after you have not eaten for a while (fasting). You may have this done every 1-3 years.  Abdominal aortic aneurysm (AAA) screening. You may need this if you are a current or former smoker.  Osteoporosis. You may be screened starting at age 63 if you are at high risk. Talk with your health care provider about your test results, treatment options, and if necessary, the need for more tests. Vaccines  Your health care provider may recommend certain vaccines, such as:  Influenza vaccine. This is recommended every year.  Tetanus, diphtheria, and acellular pertussis (Tdap, Td) vaccine. You may need a Td booster every  10 years.  Zoster vaccine. You may need this after age 21.  Pneumococcal 13-valent conjugate (PCV13) vaccine. One dose is recommended after age 65.  Pneumococcal polysaccharide (PPSV23) vaccine. One dose is recommended after age 47. Talk to your health  care provider about which screenings and vaccines you need and how often you need them. This information is not intended to replace advice given to you by your health care provider. Make sure you discuss any questions you have with your health care provider. Document Released: 08/30/2015 Document Revised: 04/22/2016 Document Reviewed: 06/04/2015 Elsevier Interactive Patient Education  2017 [place] Prevention in the Home Falls can cause injuries. They can happen to people of all ages. There are many things you can do to make your home safe and to help prevent falls. What can I do on the outside of my home?  Regularly fix the edges of walkways and driveways and fix any cracks.  Remove anything that might make you trip as you walk through a door, such as a raised step or threshold.  Trim any bushes or trees on the path to your home.  Use bright outdoor lighting.  Clear any walking paths of anything that might make someone trip, such as rocks or tools.  Regularly check to see if handrails are loose or broken. Make sure that both sides of any steps have handrails.  Any raised decks and porches should have guardrails on the edges.  Have any leaves, snow, or ice cleared regularly.  Use sand or salt on walking paths during winter.  Clean up any spills in your garage right away. This includes oil or grease spills. What can I do in the bathroom?  Use night lights.  Install grab bars by the toilet and in the tub and shower. Do not use towel bars as grab bars.  Use non-skid mats or decals in the tub or shower.  If you need to sit down in the shower, use a plastic, non-slip stool.  Keep the floor dry. Clean up any water that spills on the floor as soon as it happens.  Remove soap buildup in the tub or shower regularly.  Attach bath mats securely with double-sided non-slip rug tape.  Do not have throw rugs and other things on the floor that can make you trip. What can I do  in the bedroom?  Use night lights.  Make sure that you have a light by your bed that is easy to reach.  Do not use any sheets or blankets that are too big for your bed. They should not hang down onto the floor.  Have a firm chair that has side arms. You can use this for support while you get dressed.  Do not have throw rugs and other things on the floor that can make you trip. What can I do in the kitchen?  Clean up any spills right away.  Avoid walking on wet floors.  Keep items that you use a lot in easy-to-reach places.  If you need to reach something above you, use a strong step stool that has a grab bar.  Keep electrical cords out of the way.  Do not use floor polish or wax that makes floors slippery. If you must use wax, use non-skid floor wax.  Do not have throw rugs and other things on the floor that can make you trip. What can I do with my stairs?  Do not leave any items on the stairs.  Make sure  that there are handrails on both sides of the stairs and use them. Fix handrails that are broken or loose. Make sure that handrails are as long as the stairways.  Check any carpeting to make sure that it is firmly attached to the stairs. Fix any carpet that is loose or worn.  Avoid having throw rugs at the top or bottom of the stairs. If you do have throw rugs, attach them to the floor with carpet tape.  Make sure that you have a light switch at the top of the stairs and the bottom of the stairs. If you do not have them, ask someone to add them for you. What else can I do to help prevent falls?  Wear shoes that:  Do not have high heels.  Have rubber bottoms.  Are comfortable and fit you well.  Are closed at the toe. Do not wear sandals.  If you use a stepladder:  Make sure that it is fully opened. Do not climb a closed stepladder.  Make sure that both sides of the stepladder are locked into place.  Ask someone to hold it for you, if possible.  Clearly mark  and make sure that you can see:  Any grab bars or handrails.  First and last steps.  Where the edge of each step is.  Use tools that help you move around (mobility aids) if they are needed. These include:  Canes.  Walkers.  Scooters.  Crutches.  Turn on the lights when you go into a dark area. Replace any light bulbs as soon as they burn out.  Set up your furniture so you have a clear path. Avoid moving your furniture around.  If any of your floors are uneven, fix them.  If there are any pets around you, be aware of where they are.  Review your medicines with your doctor. Some medicines can make you feel dizzy. This can increase your chance of falling. Ask your doctor what other things that you can do to help prevent falls. This information is not intended to replace advice given to you by your health care provider. Make sure you discuss any questions you have with your health care provider. Document Released: 05/30/2009 Document Revised: 01/09/2016 Document Reviewed: 09/07/2014 Elsevier Interactive Patient Education  2017 [employer].

## 2017-05-14 ENCOUNTER — Encounter: Payer: Self-pay | Admitting: Internal Medicine

## 2017-05-14 ENCOUNTER — Non-Acute Institutional Stay (SKILLED_NURSING_FACILITY): Payer: Medicare Other | Admitting: Internal Medicine

## 2017-05-14 DIAGNOSIS — J9611 Chronic respiratory failure with hypoxia: Secondary | ICD-10-CM | POA: Diagnosis not present

## 2017-05-14 DIAGNOSIS — I480 Paroxysmal atrial fibrillation: Secondary | ICD-10-CM

## 2017-05-14 DIAGNOSIS — I10 Essential (primary) hypertension: Secondary | ICD-10-CM | POA: Diagnosis not present

## 2017-05-14 DIAGNOSIS — F419 Anxiety disorder, unspecified: Secondary | ICD-10-CM

## 2017-05-14 DIAGNOSIS — E43 Unspecified severe protein-calorie malnutrition: Secondary | ICD-10-CM

## 2017-05-14 DIAGNOSIS — J439 Emphysema, unspecified: Secondary | ICD-10-CM | POA: Diagnosis not present

## 2017-05-14 NOTE — Progress Notes (Signed)
Provider:  Oneal Grout MD  Location:  [name] Room Number: 56 Place of Service:  SNF (31)  PCP: Oneal Grout, MD Patient Care Team: Oneal Grout, MD as PCP - General (Internal Medicine) [name], MD as Consulting Physician (Cardiology) Mckinley Jewel, MD as Consulting Physician (Ophthalmology) Newman Pies, MD as Consulting Physician (Otolaryngology) Michele Mcalpine, MD as Consulting Physician (Pulmonary Disease) Mast, Man X, NP as Nurse Practitioner (Internal Medicine)  Extended Emergency Contact Information Primary Emergency Contact: Sedberry,Nathan Address: [address]          [place], [place] Home Phone: [phone] Work Phone: [phone] Relation: Son Secondary Emergency Contact: Sauser,Kimberly Address: [address]          [place], [place] [address] [place] Home Phone: [phone] Work Phone: [phone] Mobile Phone: [phone] Relation: Daughter  Code Status: DNR Goals of Care: Advanced Directive information Advanced Directives 05/14/2017  Does Patient Have a Medical Advance Directive? Yes  Type of Estate agent of [place];Out of facility DNR (pink MOST or yellow form)  Does patient want to make changes to medical advance directive? No - Patient declined  Copy of Healthcare Power of Attorney in Chart? Yes  Pre-existing out of facility DNR order (yellow form or pink MOST form) Yellow form placed in chart (order not valid for inpatient use)      Chief Complaint  Patient presents with  . New Admit To SNF    New Admission Visit     HPI: Patient is a 76 y.o. male seen today for admission visit. He was residing in ALF prior to this and given his increased medical need and need for assistance with ADLs, he has been transferred to SNF. He is being followed by hospice services for chronic respiratory failure with goals of care for comfort. He appears  comfortable in his room. He is tolerating oxygen well and is currently on 3 l o2.    Past Medical History:  Diagnosis Date  . COPD (chronic obstructive pulmonary disease) (HCC)   . Coronary artery disease   . Emphysema   . Glaucoma   . Hypercholesterolemia   . Hypertension   . 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 Thyroid disease   . Unstable gait 07/19/2015  . Weak 07/19/2015   Past Surgical History:  Procedure Laterality Date  . BACK SURGERY  1982  . CATARACT EXTRACTION  1996/2003   bilateral  . CORONARY ANGIOPLASTY WITH STENT PLACEMENT  2009  . TONSILLECTOMY  1943  . VASECTOMY  1970  . WISDOM TOOTH EXTRACTION  1977    reports that he quit smoking about 22 years ago. His smoking use included Cigarettes. He has a 35.00 pack-year smoking history. He has never used smokeless tobacco. He reports that he does not drink alcohol or use drugs. Social History   Social History  . Marital status: Married    Spouse name: N/A  . Number of children: N/A  . Years of education: N/A   Occupational History  . retired from [employer]    Social History Main Topics  . Smoking status: Former Smoker    Packs/day: 1.00    Years: 35.00    Types: Cigarettes    Quit date: 08/17/1994  . Smokeless tobacco: Never Used  . Alcohol use No  . Drug use: No  . Sexual activity: Not Currently   Other Topics Concern  . Not on file  Social History Narrative   Lives at [facility], [place] since 08/30/2015      Do you drink/eat things with caffeine? Yes      Marital status?    Widowed                                What year were you married? 1962      Do you live in a house, apartment, assisted living, condo, trailer, etc.? Assisted living      Is it one or more stories? Yes      Do you have any pets in your home? (please list) No      Current or past profession:  IT consultant      Do you exercise? Yes                   Type & how often? Walk hallway 2-3 times a  week      Do you have a living will? Yes      Do you have a DNR form? Yes      If not, do you want to discuss one?      Do you have signed POA/HPOA forms? Yes, on file.           Functional Status Survey:    Family History  Problem Relation Age of Onset  . Emphysema Father   . Heart failure Father   . Stroke Father   . Heart disease Father   . Heart failure Brother   . Cancer Brother   . Heart disease Mother   . Thyroid disease Daughter   . Hypertension Son   . Thyroid disease Son     Health Maintenance  Topic Date Due  . PNA vac Low Risk Adult (2 of 2 - PCV13) 05/17/2017 (Originally 04/17/2016)  . INFLUENZA VACCINE  06/17/2017 (Originally 03/17/2017)  . TETANUS/TDAP  08/17/2018 (Originally 12/23/1959)    Allergies  Allergen Reactions  . Benazepril Other (See Comments)    sensitive to sun light  . Benazepril Hcl     REACTION: sensitive to sunlight  . Levofloxacin     REACTION: tendonitis    Outpatient Encounter Prescriptions as of 05/14/2017  Medication Sig  . acetaZOLAMIDE (DIAMOX) 250 MG tablet Take 1 tablet (250 mg total) by mouth every morning.  . bisacodyl (DULCOLAX) 10 MG suppository Place 10 mg rectally as needed for moderate constipation.  . brimonidine (ALPHAGAN) 0.2 % ophthalmic solution Place 1 drop into the left eye 2 (two) times daily.  [name] diltiazem (CARDIZEM CD) 120 MG 24 hr capsule Take 120 mg by mouth daily.  [name] ipratropium-albuterol (DUONEB) 0.5-2.5 (3) MG/3ML SOLN Take 3 mLs by nebulization 3 (three) times daily.  [name] latanoprost (XALATAN) 0.005 % ophthalmic solution Place 1 drop into both eyes at bedtime.  [name] lisinopril (PRINIVIL,ZESTRIL) 40 MG tablet Take 40 mg by mouth daily.  [name] LORazepam (ATIVAN) 1 MG tablet Take 1 mg by mouth 4 (four) times daily.  [name] LORazepam (ATIVAN) 1 MG tablet Take 1 mg by mouth every 4 (four) hours as needed for anxiety (in addition to the scheduled dose).  . mirtazapine (REMERON) 15 MG tablet Take 15 mg by mouth at bedtime.  [name]  morphine 20 MG/5ML solution Take 5 mg by mouth every 2 (two) hours as needed for pain (in addition to the scheduled dose).   . MORPHINE SULFATE, CONCENTRATE, PO Take 5 mg  by mouth 3 (three) times daily.  . OXYGEN Inhale 3 L into the lungs continuous.   . polyethylene glycol (MIRALAX / GLYCOLAX) packet Take 17 g by mouth every other day.   . predniSONE (DELTASONE) 10 MG tablet Take 20 mg by mouth daily.   . sodium chloride (OCEAN) 0.65 % SOLN nasal spray Place 2-3 sprays into both nostrils as needed for congestion.  . [DISCONTINUED] lactose free nutrition (BOOST) LIQD Take 237 mLs by mouth 2 (two) times daily.    No facility-administered encounter medications on file as of 05/14/2017.     Review of Systems  Constitutional: Negative for appetite change, chills and fever.  HENT: Positive for hearing loss and trouble swallowing. Negative for congestion, mouth sores, rhinorrhea and sore throat.   Respiratory: Positive for shortness of breath. Negative for cough.   Cardiovascular: Negative for chest pain, palpitations and leg swelling.  Gastrointestinal: Negative for abdominal pain, constipation, diarrhea, nausea and vomiting.  Genitourinary: Negative for dysuria and hematuria.  Musculoskeletal: Negative for arthralgias and back pain.  Skin: Negative for rash.  Neurological: Negative for dizziness and headaches.  Psychiatric/Behavioral: Positive for confusion.    Vitals:   05/14/17 1033  BP: 100/66  Pulse: 74  Resp: 20  Temp: 98.3 F (36.8 C)  TempSrc: Oral  SpO2: 95%  Weight: 128 lb (58.1 kg)  Height:  (1.854 m)   Body mass index is 16.89 kg/m. Physical Exam  Constitutional:  Elderly frail male patient in no acute distress  HENT:  Head: Normocephalic and atraumatic.  Mouth/Throat: Oropharynx is clear and moist.  Eyes: Pupils are equal, round, and reactive to light. Conjunctivae are normal.  Neck: Neck supple.  Cardiovascular: Normal rate and regular rhythm.     Pulmonary/Chest: Effort normal. He has no wheezes. He has no rales.  Decreased air entry to lung bases. On 3 liters oxygen by nasal canula.   Abdominal: Soft. Bowel sounds are normal. There is no tenderness. There is no guarding.  Musculoskeletal: He exhibits no edema.  Can move all 4 extremities  Lymphadenopathy:    He has no cervical adenopathy.  Neurological: He is alert.  Oriented to person and place but not to time  Skin: Skin is warm and dry. No rash noted. He is not diaphoretic.  Easy bruising  Psychiatric: He has a normal mood and affect.    Labs reviewed: Basic Metabolic Panel:  Recent Labs  40/98/11  NA 146  K 3.8  BUN 24*  CREATININE 1.0   Liver Function Tests:  Recent Labs  09/15/16  AST 13*  ALT 12  ALKPHOS 55   No results for input(s): LIPASE, AMYLASE in the last 8760 hours. No results for input(s): AMMONIA in the last 8760 hours. CBC:  Recent Labs  09/15/16  WBC 7.3  HGB 12.4*  HCT 39*  PLT 218   Cardiac Enzymes: No results for input(s): CKTOTAL, CKMB, CKMBINDEX, TROPONINI in the last 8760 hours. BNP: Invalid input(s): POCBNP Lab Results  Component Value Date   HGBA1C 5.9 09/15/2016   Lab Results  Component Value Date   TSH 1.27 09/15/2016   Lab Results  Component Value Date   VITAMINB12 625 02/26/2015   No results found for: FOLATE No results found for: IRON, TIBC, FERRITIN  Imaging and Procedures obtained prior to SNF admission: Dg Chest Port 1 View  Result Date: 07/10/2015 CLINICAL DATA:  Shortness of breath. COPD. Acute respiratory failure with hypoxemia. EXAM: PORTABLE CHEST 1 VIEW COMPARISON:  03/08/2015 FINDINGS:  Heart size is normal. Severe pulmonary hyperinflation again seen, consistent with COPD. No evidence of pulmonary infiltrate or pleural effusion. Probable skin fold seen overlying peripheral right upper lung field. IMPRESSION: Severe COPD.  No acute findings. Electronically Signed   By: Myles Rosenthal M.D.   On: 07/10/2015  07:54    Assessment/Plan  Chronic respiratory failure With COPD, advanced, poor prognosis, continue comfort care with hsopice service. Currently on morphine 5 mg TID with q2h prn dyspnea and pain. Continue oxygen by nasal canula continuously and bronchodilators tid.   Hypertension Continue lisinopril 40 mg daily and monitor  Copd Continue o2 by nasal canula with duoneb tid and daily prednisone. Continue acetazolamide   Severe protein calorie malnutrition Continue nutritional supplement as tolerated and encourage po intake. Assist with feeding. Continue remeron   Anxiety With air hunger from dyspnea with his severe copd, continue lorazepam 1 mg q4h prn along with scheduled ativan dosing and monitor   afib Controlled. Continue his diltiazem and monitor. Not on anticoagulation with his fall risk    Family/ staff Communication: reviewed care plan with patient and charge nurse.    Labs/tests ordered: none  Oneal Grout, MD Internal Medicine [facility] [address] [place], [place] [address] Cell Phone (Monday-Friday 8 am - 5 pm): [phone] On Call: [phone] and follow prompts after 5 pm and on weekends Office Phone: [phone] Office Fax: [phone]

## 2017-06-14 ENCOUNTER — Non-Acute Institutional Stay (SKILLED_NURSING_FACILITY): Payer: Medicare Other | Admitting: Nurse Practitioner

## 2017-06-14 ENCOUNTER — Encounter: Payer: Self-pay | Admitting: Nurse Practitioner

## 2017-06-14 DIAGNOSIS — I10 Essential (primary) hypertension: Secondary | ICD-10-CM

## 2017-06-14 DIAGNOSIS — J439 Emphysema, unspecified: Secondary | ICD-10-CM | POA: Diagnosis not present

## 2017-06-14 DIAGNOSIS — I480 Paroxysmal atrial fibrillation: Secondary | ICD-10-CM

## 2017-06-14 DIAGNOSIS — S0083XA Contusion of other part of head, initial encounter: Secondary | ICD-10-CM | POA: Diagnosis not present

## 2017-06-14 DIAGNOSIS — W19XXXA Unspecified fall, initial encounter: Secondary | ICD-10-CM

## 2017-06-14 DIAGNOSIS — F411 Generalized anxiety disorder: Secondary | ICD-10-CM

## 2017-06-14 DIAGNOSIS — K5900 Constipation, unspecified: Secondary | ICD-10-CM

## 2017-06-14 NOTE — Assessment & Plan Note (Signed)
Close supervision needed, the patient is frail and hs poor safety awareness, continue Hospice Service for comfort care.

## 2017-06-14 NOTE — Assessment & Plan Note (Signed)
blood pressures is controlled, continue Lisinopril 40mg  qd, Diamox 250mg  qd

## 2017-06-14 NOTE — Assessment & Plan Note (Signed)
Heart rate is in control.  

## 2017-06-14 NOTE — Assessment & Plan Note (Addendum)
Continue Lorazepam 1mg  qid and qid prn, Morphine 5mg  tid, Prednisone 20mg  qd, DuoNeb tid for comfort measures.

## 2017-06-14 NOTE — Assessment & Plan Note (Signed)
mood is stable, continue  Mirtazapine 15mg  qd,

## 2017-06-14 NOTE — Assessment & Plan Note (Signed)
Stable, continue MiraLax qod, prn DulcoLax daily,

## 2017-06-14 NOTE — Progress Notes (Signed)
[facility]:  [name] Room Number: 8053 Place of Service:  SNF (31) Provider:  Tesean Stump, Manxie  NP  [name], [name], MD  Patient Care Team: [name], [name], MD as PCP - General (Internal Medicine) Corky CraftsVaranasi, Jayadeep S, MD as Consulting Physician (Cardiology) Mckinley JewelStoneburner, Sara, MD as Consulting Physician (Ophthalmology) Newman Pieseoh, Su, MD as Consulting Physician (Otolaryngology) Michele McalpineNadel, Scott M, MD as Consulting Physician (Pulmonary Disease) Owen Pagnotta X, NP as Nurse Practitioner (Internal Medicine)  Extended Emergency Contact Information Primary Emergency Contact: Coven,Nathan Address: [address]          [place], [place] Home Phone: [phone] Work Phone: [phone] Relation: Son Secondary Emergency Contact: Berquist,Kimberly Address: [address]          [place], [place] [address] [place] Home Phone: [phone] Work Phone: [phone] Mobile Phone: [phone] Relation: Daughter  Code Status:  DNR Goals of care: Advanced Directive information Advanced Directives 05/14/2017  Does Patient Have a Medical Advance Directive? Yes  Type of Estate agentAdvance Directive Healthcare Power of Gould[place]orney;Out of facility DNR (pink MOST or yellow form)  Does patient want to make changes to medical advance directive? No - Patient declined  Copy of Healthcare Power of Attorney in Chart? Yes  Pre-existing out of facility DNR order (yellow form or pink MOST form) Yellow form placed in chart (order not valid for inpatient use)     Chief Complaint  Patient presents with  . Medical Management of Chronic Issues    HPI:  Pt is a 76 y.o. male seen today for medical management of chronic diseases.     The patient is under the Hospice service for end stage COPD, O2 dependent, gaol of care is comfort measures, takes Lorazepam 1mg  qid and qid prn, Morphine 5mg  tid, Prednisone 20mg  qd, DuoNeb tid for comfort measures.    The patient has  constipation, managed with MiraLax qod, prn DulcoLax daily, mood is managed with Mirtazapine 15mg  qd, blood pressures is controlled on Lisinopril 40mg  qd, Diamox 250mg  qd   Larey SeatFell out of bed 06/12/17, sustained multiple skin abrasions, the right temporal area-closed with steri strips,  right upper eyelid, bridge of nose-closed with steri strips, , left forearm-closed with steri strips, right knee-closed with steri strips, , no sign of infection.  Past Medical History:  Diagnosis Date  . COPD (chronic obstructive pulmonary disease) (HCC)   . Coronary artery disease   . Emphysema   . Glaucoma   . Hypercholesterolemia   . Hypertension   . 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 Thyroid disease   . Unstable gait 07/19/2015  . Weak 07/19/2015   Past Surgical History:  Procedure Laterality Date  . BACK SURGERY  1982  . CATARACT EXTRACTION  1996/2003   bilateral  . CORONARY ANGIOPLASTY WITH STENT PLACEMENT  2009  . TONSILLECTOMY  1943  . VASECTOMY  1970  . WISDOM TOOTH EXTRACTION  1977    Allergies  Allergen Reactions  . Benazepril Other (See Comments)    sensitive to sun light  . Benazepril Hcl     REACTION: sensitive to sunlight  . Levofloxacin     REACTION: tendonitis    Outpatient Encounter Prescriptions as of 06/14/2017  Medication Sig  . acetaZOLAMIDE (DIAMOX) 250 MG tablet Take 1 tablet (250 mg total) by mouth every morning.  . bisacodyl (DULCOLAX) 10 MG suppository Place 10 mg rectally as needed for moderate constipation.  . brimonidine (ALPHAGAN) 0.2 % ophthalmic solution Place 1 drop into the  left eye 2 (two) times daily.  [name] ipratropium-albuterol (DUONEB) 0.5-2.5 (3) MG/3ML SOLN Take 3 mLs by nebulization 3 (three) times daily.  [name] latanoprost (XALATAN) 0.005 % ophthalmic solution Place 1 drop into both eyes at bedtime.  [name] lisinopril (PRINIVIL,ZESTRIL) 40 MG tablet Take 40 mg by mouth daily.  [name] LORazepam (ATIVAN) 1 MG tablet Take 1 mg by mouth 4 (four) times daily.  [name] LORazepam  (ATIVAN) 1 MG tablet Take 1 mg by mouth every 4 (four) hours as needed for anxiety (in addition to the scheduled dose).  . mirtazapine (REMERON) 15 MG tablet Take 15 mg by mouth at bedtime.  [name] morphine 20 MG/5ML solution Take 5 mg by mouth every 2 (two) hours as needed for pain (in addition to the scheduled dose).   . MORPHINE SULFATE, CONCENTRATE, PO Take 5 mg by mouth 3 (three) times daily.  . OXYGEN Inhale 3 L into the lungs continuous.   . polyethylene glycol (MIRALAX / GLYCOLAX) packet Take 17 g by mouth every other day.   . predniSONE (DELTASONE) 10 MG tablet Take 20 mg by mouth daily.   . [DISCONTINUED] diltiazem (CARDIZEM CD) 120 MG 24 hr capsule Take 120 mg by mouth daily.  . [DISCONTINUED] sodium chloride (OCEAN) 0.65 % SOLN nasal spray Place 2-3 sprays into both nostrils as needed for congestion.   No facility-administered encounter medications on file as of 06/14/2017.     Review of Systems  Constitutional: Positive for activity change, appetite change and fatigue. Negative for chills, diaphoresis and fever.       Frail, thin  HENT: Positive for hearing loss, trouble swallowing and voice change.   Eyes: Negative for visual disturbance.  Respiratory: Positive for cough and shortness of breath.        O2 dependent.   Cardiovascular: Negative for chest pain, palpitations and leg swelling.  Gastrointestinal: Negative for abdominal distention and abdominal pain.  Genitourinary: Negative for difficulty urinating and dysuria.  Musculoskeletal: Positive for gait problem.       Non ambulatory. Larey Seat out of bed 06/12/17 with head on air mattress control bos, resulted in abrasion bridge of nose, right upper eyelid, right forehead, right knee, and left fore arm, steri-strips in place for wound closures.   Skin: Positive for wound.       Larey Seat out of bed 06/12/17 with head on air mattress control bos, resulted in abrasion bridge of nose, right upper eyelid, right forehead, right knee, and left  fore arm, steri-strips in place for wound closures except right eye lid.   Neurological: Negative for tremors, seizures, facial asymmetry, speech difficulty, weakness and headaches.  Psychiatric/Behavioral: Positive for confusion. Negative for agitation, behavioral problems, hallucinations and sleep disturbance. The patient is not nervous/anxious.     Immunization History  Administered Date(s) Administered  . Influenza Split 05/16/2012, 06/17/2013  . Influenza Whole 05/17/2009, 04/24/2010, 05/18/2011  . Influenza,inj,Quad PF,6+ Mos 04/20/2014  . Influenza-Unspecified 04/18/2015, 06/03/2016  . Pneumococcal Polysaccharide-23 05/17/2009, 04/18/2015   Pertinent  Health Maintenance Due  Topic Date Due  . PNA vac Low Risk Adult (2 of 2 - PCV13) 04/17/2016  . INFLUENZA VACCINE  06/17/2017 (Originally 03/17/2017)   Fall Risk  05/07/2017 11/21/2015  Falls in the past year? No No   Functional Status Survey:    There were no vitals filed for this visit. There is no height or weight on file to calculate BMI. Physical Exam  Constitutional: He appears well-developed and well-nourished.  Thin  HENT:  Head: Normocephalic  and atraumatic.  Eyes: Pupils are equal, round, and reactive to light. Conjunctivae and EOM are normal.  Neck: Normal range of motion. Neck supple. No JVD present.  Cardiovascular: Normal rate, regular rhythm and normal heart sounds.   No murmur heard. Pulmonary/Chest: He has no wheezes. He has no rales.  Decreased breath sounds. O2 dependent. SOB at rest. Only can say one or two words due to SOB  Abdominal: Soft. Bowel sounds are normal. He exhibits no distension. There is no tenderness.  Musculoskeletal: Normal range of motion. He exhibits no edema or tenderness.  Neurological: He is alert. No cranial nerve deficit. He exhibits normal muscle tone. Coordination normal.  Oriented to person and place.   Skin: Skin is warm and dry. No rash noted. No pallor.  Larey Seat out of bed  06/12/17 with head on air mattress control bos, resulted in abrasion bridge of nose, right upper eyelid, right forehead, right knee, and left fore arm, steri-strips in place for wound closures except right eyelid.   Psychiatric: He has a normal mood and affect. His behavior is normal.    Labs reviewed:  Recent Labs  09/15/16  NA 146  K 3.8  BUN 24*  CREATININE 1.0    Recent Labs  09/15/16  AST 13*  ALT 12  ALKPHOS 55    Recent Labs  09/15/16  WBC 7.3  HGB 12.4*  HCT 39*  PLT 218   Lab Results  Component Value Date   TSH 1.27 09/15/2016   Lab Results  Component Value Date   HGBA1C 5.9 09/15/2016   No results found for: CHOL, HDL, LDLCALC, LDLDIRECT, TRIG, CHOLHDL  Significant Diagnostic Results in last 30 days:  No results found.  Assessment/Plan Facial contusion, initial encounter The right forehead, bridge of nose, right upper eyelid, left forearm, right knee skin laceration is closed with steri strips, no sing of infection, it should heal, self exfoliation of steri strips. Observe.   Fall Close supervision needed, the patient is frail and hs poor safety awareness, continue Hospice Service for comfort care.   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 Continue Lorazepam 1mg  qid and qid prn, Morphine 5mg  tid, Prednisone 20mg  qd, DuoNeb tid for comfort measures.   Constipation Stable, continue MiraLax qod, prn DulcoLax daily,  Generalized anxiety disorder  mood is stable, continue  Mirtazapine 15mg  qd,  Essential hypertension blood pressures is controlled, continue Lisinopril 40mg  qd, Diamox 250mg  qd   Paroxysmal atrial fibrillation (HCC) Heart rate is in control.      Family/ staff Communication: plan of care reviewed with the patient and charge nurse.   Labs/tests ordered:  none  Time spend 25 minutes.

## 2017-06-14 NOTE — Assessment & Plan Note (Signed)
The right forehead, bridge of nose, right upper eyelid, left forearm, right knee skin laceration is closed with steri strips, no sing of infection, it should heal, self exfoliation of steri strips. Observe.

## 2017-07-16 ENCOUNTER — Encounter: Payer: Self-pay | Admitting: Internal Medicine

## 2017-07-16 ENCOUNTER — Non-Acute Institutional Stay (SKILLED_NURSING_FACILITY): Payer: Medicare Other | Admitting: Internal Medicine

## 2017-07-16 DIAGNOSIS — J9611 Chronic respiratory failure with hypoxia: Secondary | ICD-10-CM | POA: Diagnosis not present

## 2017-07-16 DIAGNOSIS — I10 Essential (primary) hypertension: Secondary | ICD-10-CM

## 2017-07-16 DIAGNOSIS — I480 Paroxysmal atrial fibrillation: Secondary | ICD-10-CM | POA: Diagnosis not present

## 2017-07-16 DIAGNOSIS — E43 Unspecified severe protein-calorie malnutrition: Secondary | ICD-10-CM | POA: Diagnosis not present

## 2017-07-16 NOTE — Progress Notes (Signed)
Location:  [name] Room Number: [id number] Place of Service:  SNF [phone]) Provider:  Oneal GroutMahima Kaylina Cahue MD  [name], Zollie Ellery, MD  Patient Care Team: [name], Nicolaus Andel, MD as PCP - General (Internal Medicine) Corky CraftsVaranasi, Jayadeep S, MD as Consulting Physician (Cardiology) Mckinley JewelStoneburner, Sara, MD as Consulting Physician (Ophthalmology) Newman Pieseoh, Su, MD as Consulting Physician (Otolaryngology) Michele McalpineNadel, Scott M, MD as Consulting Physician (Pulmonary Disease) Mast, Man X, NP as Nurse Practitioner (Internal Medicine)  Extended Emergency Contact Information Primary Emergency Contact: Bartz,Nathan Address: [address]          [place], [place] Home Phone: [phone] Work Phone: [phone] Relation: Son Secondary Emergency Contact: Kaltenbach,Kimberly Address: [address]          [place], [place] [address] [place] Home Phone: [phone] Work Phone: [phone] Mobile Phone: [phone] Relation: Daughter  Code Status:  DNR  Goals of care: Advanced Directive information Advanced Directives 05/14/2017  Does Patient Have a Medical Advance Directive? Yes  Type of Estate agentAdvance Directive Healthcare Power of [place] BayAttorney;Out of facility DNR (pink MOST or yellow form)  Does patient want to make changes to medical advance directive? No - Patient declined  Copy of Healthcare Power of Attorney in Chart? Yes  Pre-existing out of facility DNR order (yellow form or pink MOST form) Yellow form placed in chart (order not valid for inpatient use)     Chief Complaint  Patient presents with  . Medical Management of Chronic Issues    HPI:  Pt is a 76 y.o. male seen today for medical management of chronic diseases. His end stage COPD has progressed. He is on continuous oxygen by nasal canula and his breathing treatment. His oral intake has decline. He is in bed most of the time. He is followed by hospice services. He is comfortable at present on exam. He is not  participating in HPI and ROS this visit. He follows me with his eyes. He continues to lose weight.    Past Medical History:  Diagnosis Date  . COPD (chronic obstructive pulmonary disease) (HCC)   . Coronary artery disease   . Emphysema   . Glaucoma   . Hypercholesterolemia   . Hypertension   . 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 Thyroid disease   . Unstable gait 07/19/2015  . Weak 07/19/2015   Past Surgical History:  Procedure Laterality Date  . BACK SURGERY  1982  . CATARACT EXTRACTION  1996/2003   bilateral  . CORONARY ANGIOPLASTY WITH STENT PLACEMENT  2009  . TONSILLECTOMY  1943  . VASECTOMY  1970  . WISDOM TOOTH EXTRACTION  1977    Allergies  Allergen Reactions  . Benazepril Other (See Comments)    sensitive to sun light  . Benazepril Hcl     REACTION: sensitive to sunlight  . Levofloxacin     REACTION: tendonitis    Outpatient Encounter Medications as of 07/16/2017  Medication Sig  . acetaZOLAMIDE (DIAMOX) 250 MG tablet Take 1 tablet (250 mg total) by mouth every morning.  . bisacodyl (DULCOLAX) 10 MG suppository Place 10 mg rectally as needed for moderate constipation.  . brimonidine (ALPHAGAN) 0.2 % ophthalmic solution Place 1 drop into the left eye 2 (two) times daily.  [name]. ipratropium-albuterol (DUONEB) 0.5-2.5 (3) MG/3ML SOLN Take 3 mLs by nebulization 3 (three) times daily.  [name]. latanoprost (XALATAN) 0.005 % ophthalmic solution Place 1 drop into both eyes at bedtime.  [name]. lisinopril (PRINIVIL,ZESTRIL) 40 MG tablet Take 40 mg by  mouth daily.  [name] LORazepam (ATIVAN) 1 MG tablet Take 1 mg by mouth 4 (four) times daily.  [name] LORazepam (ATIVAN) 1 MG tablet Take 1 mg by mouth every 4 (four) hours as needed for anxiety (in addition to the scheduled dose).  . mirtazapine (REMERON) 15 MG tablet Take 15 mg by mouth at bedtime.  [name] morphine 20 MG/5ML solution Take 5 mg by mouth every 2 (two) hours as needed for pain (in addition to the scheduled dose).   . MORPHINE SULFATE, CONCENTRATE,  PO Take 5 mg by mouth 3 (three) times daily.  . OXYGEN Inhale 3 L into the lungs continuous.   . polyethylene glycol (MIRALAX / GLYCOLAX) packet Take 17 g by mouth every other day.   . predniSONE (DELTASONE) 10 MG tablet Take 20 mg by mouth daily.   . SODIUM CHLORIDE, EXTERNAL, 0.9 % GEL Apply 2-3 sprays topically as needed (for nasal dryness and discomfort).   No facility-administered encounter medications on file as of 07/16/2017.     Review of Systems  Unable to perform ROS: Dementia (obtained from nursing with him not participating)  Constitutional: Positive for appetite change. Negative for fever.  HENT: Positive for hearing loss. Negative for mouth sores.   Respiratory: Positive for shortness of breath and wheezing.   Gastrointestinal: Negative for diarrhea and vomiting.  Genitourinary:       Has urinary incontinence  Musculoskeletal: Positive for gait problem.  Hematological: Bruises/bleeds easily.  Psychiatric/Behavioral: Positive for confusion.    Immunization History  Administered Date(s) Administered  . Influenza Split 05/16/2012, 06/17/2013  . Influenza Whole 05/17/2009, 04/24/2010, 05/18/2011  . Influenza,inj,Quad PF,6+ Mos 04/20/2014  . Influenza-Unspecified 04/18/2015, 06/03/2016, 06/03/2017  . Pneumococcal Polysaccharide-23 05/17/2009, 04/18/2015   Pertinent  Health Maintenance Due  Topic Date Due  . PNA vac Low Risk Adult (2 of 2 - PCV13) 04/17/2016  . INFLUENZA VACCINE  Completed   Fall Risk  05/07/2017 11/21/2015  Falls in the past year? No No   Functional Status Survey:    Vitals:   07/16/17 0848  BP: 110/68  Pulse: 80  Resp: 18  Temp: 97.9 F (36.6 C)  SpO2: 95%  Weight: 107 lb 14.4 oz (48.9 kg)  Height: 6\' 1"  (1.854 m)   Body mass index is 14.24 kg/m. Physical Exam  Constitutional: No distress.  Frail, thin built, elderly male   HENT:  Head: Normocephalic and atraumatic.  Mouth/Throat: Oropharynx is clear and moist.  Eyes: Conjunctivae  and EOM are normal. Pupils are equal, round, and reactive to light. Right eye exhibits no discharge. Left eye exhibits no discharge.  Neck: Neck supple.  Cardiovascular: Normal rate and regular rhythm.  Pulmonary/Chest:  Purse lip breathing, on 3 l/min oxygen by nasal canula, poor air movement, no wheezing or rale this visit  Abdominal: Soft. Bowel sounds are normal. There is no tenderness. There is no guarding.  Musculoskeletal: He exhibits deformity. He exhibits no edema.  Generalized weakness, bed bound  Lymphadenopathy:    He has no cervical adenopathy.  Neurological:  Unable to assess, confused  Skin: Skin is warm and dry. He is not diaphoretic.    Labs reviewed: Recent Labs    09/15/16  NA 146  K 3.8  BUN 24*  CREATININE 1.0   Recent Labs    09/15/16  AST 13*  ALT 12  ALKPHOS 55   Recent Labs    09/15/16  WBC 7.3  HGB 12.4*  HCT 39*  PLT 218   Lab Results  Component Value Date   TSH 1.27 09/15/2016   Lab Results  Component Value Date   HGBA1C 5.9 09/15/2016   No results found for: CHOL, HDL, LDLCALC, LDLDIRECT, TRIG, CHOLHDL  Significant Diagnostic Results in last 30 days:  No results found.  Assessment/Plan  Chronic respiratory failure Ongoing, comfortable on o2 by nasal canula. Continue morphine sulfate 5 mg id with prn in between for dyspnea and pain. Continue prednisone and his bronchodilator treatment along with acetazolamide. Continue scheduled and prn lorazepam and monitor.   Hypertension Blood pressure overall controlled. Few lower readings. Continue lisinopril 40 mg daily  Severe protein calorie malnutrition With end stage COPD and failure to thrive. Encourage po intake as tolerated. Continue remeron.  Paroxysmal afib Controlled HR this visit. Of note, pt was on diltiazem before, but during his transition of care, pt was not receiving it for more than a month. On chart review, controlled HR with overall normal and some soft BP readings. I  would not recommend resuming this for now. Nursing has discussed this with patient's HCPOA and she is in agreement. Monitor clinically.      Family/ staff Communication: reviewed care plan with patient and charge nurse.    Labs/tests ordered:  none   Oneal GroutMAHIMA Rada Zegers, MD Internal Medicine [facility] [address] [place], [place] [address] Cell Phone (Monday-Friday 8 am - 5 pm): [phone] On Call: [phone] and follow prompts after 5 pm and on weekends Office Phone: [phone] Office Fax: [phone]

## 2017-08-12 ENCOUNTER — Encounter: Payer: Self-pay | Admitting: Nurse Practitioner

## 2017-08-12 ENCOUNTER — Non-Acute Institutional Stay (SKILLED_NURSING_FACILITY): Payer: Medicare Other | Admitting: Nurse Practitioner

## 2017-08-12 DIAGNOSIS — K5900 Constipation, unspecified: Secondary | ICD-10-CM

## 2017-08-12 DIAGNOSIS — R627 Adult failure to thrive: Secondary | ICD-10-CM | POA: Diagnosis not present

## 2017-08-12 DIAGNOSIS — F411 Generalized anxiety disorder: Secondary | ICD-10-CM

## 2017-08-12 DIAGNOSIS — J439 Emphysema, unspecified: Secondary | ICD-10-CM | POA: Diagnosis not present

## 2017-08-12 NOTE — Progress Notes (Signed)
[facility]:  [name] Room Number: 3353 Place of Service:  SNF (31) Provider:  Dartha Rozzell, Manxie  NP  [name], [name], MD  Patient Care Team: [name], [name], MD as PCP - General (Internal Medicine) Corky CraftsVaranasi, Jayadeep S, MD as Consulting Physician (Cardiology) Mckinley JewelStoneburner, Sara, MD as Consulting Physician (Ophthalmology) Newman Pieseoh, Su, MD as Consulting Physician (Otolaryngology) Michele McalpineNadel, Scott M, MD as Consulting Physician (Pulmonary Disease) Amauris Debois X, NP as Nurse Practitioner (Internal Medicine)  Extended Emergency Contact Information Primary Emergency Contact: Michalik,Nathan Address: [address]          [place], [place] Home Phone: [phone] Work Phone: [phone] Relation: Son Secondary Emergency Contact: Rials,Kimberly Address: [address]          [place], [place] [address] [place] Home Phone: [phone] Work Phone: [phone] Mobile Phone: [phone] Relation: Daughter  Code Status:  DNR Goals of care: Advanced Directive information Advanced Directives 08/12/2017  Does Patient Have a Medical Advance Directive? Yes  Type of Estate agentAdvance Directive Healthcare Power of [place];Out of facility DNR (pink MOST or yellow form)  Does patient want to make changes to medical advance directive? No - Patient declined  Copy of Healthcare Power of Attorney in Chart? Yes  Pre-existing out of facility DNR order (yellow form or pink MOST form) Yellow form placed in chart (order not valid for inpatient use)     Chief Complaint  Patient presents with  . Medical Management of Chronic Issues    HPI:  Pt is a 76 y.o. male seen today for medical management of chronic diseases.     Hx of COPD, end stage, O2 dependent, total care of ADLs, Morphine 5mg  tid, NuoNeb tid,  Prednisone 20mg  qd,  Lorazepam 1mg  qid, constipation, managed with Senna S I qhs. His mood is stable on Mirtazapine 15mg  qhs   Past Medical History:    Diagnosis Date  . COPD (chronic obstructive pulmonary disease) (HCC)   . Coronary artery disease   . Emphysema   . Glaucoma   . Hypercholesterolemia   . Hypertension   . 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 Thyroid disease   . Unstable gait 07/19/2015  . Weak 07/19/2015   Past Surgical History:  Procedure Laterality Date  . BACK SURGERY  1982  . CATARACT EXTRACTION  1996/2003   bilateral  . CORONARY ANGIOPLASTY WITH STENT PLACEMENT  2009  . TONSILLECTOMY  1943  . VASECTOMY  1970  . WISDOM TOOTH EXTRACTION  1977    Allergies  Allergen Reactions  . Benazepril Other (See Comments)    sensitive to sun light  . Benazepril Hcl     REACTION: sensitive to sunlight  . Levofloxacin     REACTION: tendonitis    Outpatient Encounter Medications as of 08/12/2017  Medication Sig  . acetaZOLAMIDE (DIAMOX) 250 MG tablet Take 1 tablet (250 mg total) by mouth every morning.  . bisacodyl (DULCOLAX) 10 MG suppository Place 10 mg rectally as needed for moderate constipation.  . brimonidine (ALPHAGAN) 0.2 % ophthalmic solution Place 1 drop into the left eye 2 (two) times daily.  . feeding supplement (BOOST HIGH PROTEIN) LIQD Take 1 Container by mouth 3 (three) times daily between meals.  [name]. ipratropium-albuterol (DUONEB) 0.5-2.5 (3) MG/3ML SOLN Take 3 mLs by nebulization 3 (three) times daily.  [name]. latanoprost (XALATAN) 0.005 % ophthalmic solution Place 1 drop into both eyes at bedtime.  [name]. lisinopril (PRINIVIL,ZESTRIL) 40 MG tablet Take 40 mg by mouth daily.  .[name]  LORazepam (ATIVAN) 1 MG tablet Take 1 mg by mouth 4 (four) times daily.  [name] LORazepam (ATIVAN) 1 MG tablet Take 1 mg by mouth every 4 (four) hours as needed for anxiety (in addition to the scheduled dose).  . mirtazapine (REMERON) 15 MG tablet Take 15 mg by mouth at bedtime.  [name] morphine 20 MG/5ML solution Take 5 mg by mouth every 2 (two) hours as needed for pain (in addition to the scheduled dose).   . MORPHINE SULFATE, CONCENTRATE, PO Take 5 mg  by mouth 3 (three) times daily.  . NON FORMULARY Mix 4 oz. mighty shake with ice cream to make a milkshake (prefers vanilla)at bedtime.  . OXYGEN Inhale 3 L into the lungs continuous.   . predniSONE (DELTASONE) 10 MG tablet Take 20 mg by mouth daily.   [name] senna (SENOKOT) 8.6 MG tablet Take 1 tablet by mouth daily.  . SODIUM CHLORIDE, EXTERNAL, 0.9 % GEL Apply 2-3 sprays topically as needed (for nasal dryness and discomfort).  . [DISCONTINUED] polyethylene glycol (MIRALAX / GLYCOLAX) packet Take 17 g by mouth every other day.    No facility-administered encounter medications on file as of 08/12/2017.     Review of Systems  Constitutional: Positive for fatigue. Negative for activity change, appetite change, chills, diaphoresis and fever.  HENT: Positive for hearing loss. Negative for congestion, trouble swallowing and voice change.   Eyes: Negative for visual disturbance.  Respiratory: Positive for shortness of breath. Negative for cough, choking, chest tightness and wheezing.        SOB before finishes a sentence.   Gastrointestinal: Negative for abdominal distention, abdominal pain, constipation, diarrhea, nausea and vomiting.  Endocrine: Negative for cold intolerance.  Genitourinary: Negative for difficulty urinating, dysuria and urgency.       Incontinent of urine.  Musculoskeletal: Positive for gait problem.       Getting out of bed to recliner during day.   Skin: Negative for color change, pallor and rash.  Neurological: Negative for tremors, speech difficulty, weakness and headaches.  Psychiatric/Behavioral: Positive for confusion. Negative for agitation, behavioral problems, hallucinations and sleep disturbance. The patient is nervous/anxious.        Anxiety when he gets out of breath    Immunization History  Administered Date(s) Administered  . Influenza Split 05/16/2012, 06/17/2013  . Influenza Whole 05/17/2009, 04/24/2010, 05/18/2011  . Influenza,inj,Quad PF,6+ Mos 04/20/2014   . Influenza-Unspecified 04/18/2015, 06/03/2016, 06/03/2017  . Pneumococcal Polysaccharide-23 05/17/2009, 04/18/2015   Pertinent  Health Maintenance Due  Topic Date Due  . PNA vac Low Risk Adult (2 of 2 - PCV13) 04/17/2016  . INFLUENZA VACCINE  Completed   Fall Risk  05/07/2017 11/21/2015  Falls in the past year? No No   Functional Status Survey:    Vitals:   08/12/17 1015  BP: 110/74  Pulse: 84  Resp: 20  Temp: (!) 97.3 F (36.3 C)  SpO2: 94%  Weight: 107 lb 14.4 oz (48.9 kg)  Height: 6\' 1"  (1.854 m)   Body mass index is 14.24 kg/m. Physical Exam  Constitutional: He appears well-developed and well-nourished.  HENT:  Head: Normocephalic and atraumatic.  Eyes: Conjunctivae and EOM are normal. Pupils are equal, round, and reactive to light.  Neck: Normal range of motion. Neck supple. No JVD present. No thyromegaly present.  Cardiovascular: Normal rate, regular rhythm and normal heart sounds.  No murmur heard. Pulmonary/Chest: Effort normal and breath sounds normal. He has no wheezes. He has no rales.  Decreased air entry, SOB  before he finishes a sentence. O2 dependent.   Abdominal: Soft. Bowel sounds are normal. He exhibits no distension. There is no tenderness.  Musculoskeletal: Normal range of motion. He exhibits no edema or tenderness.  Mechanical lift for transfer.   Neurological: He is alert. He exhibits normal muscle tone. Coordination normal.  Oriented to person  Skin: Skin is warm and dry. No rash noted. No erythema.  Psychiatric: He has a normal mood and affect. His behavior is normal.    Labs reviewed: Recent Labs    09/15/16  NA 146  K 3.8  BUN 24*  CREATININE 1.0   Recent Labs    09/15/16  AST 13*  ALT 12  ALKPHOS 55   Recent Labs    09/15/16  WBC 7.3  HGB 12.4*  HCT 39*  PLT 218   Lab Results  Component Value Date   TSH 1.27 09/15/2016   Lab Results  Component Value Date   HGBA1C 5.9 09/15/2016   No results found for: CHOL, HDL,  LDLCALC, LDLDIRECT, TRIG, CHOLHDL  Significant Diagnostic Results in last 30 days:  No results found.  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   Failure to thrive in adult Persists, continue supportive care, comfort measures.  Generalized anxiety disorder His mood is managed, continue Mirtazapine 15mg  qhs and Lorazepam 1mg  qid.       Family/ staff Communication: plan of care reviewed with the patient and charge nurse.   Labs/tests ordered:  none  Time spend 25 mointes

## 2017-08-12 NOTE — Assessment & Plan Note (Signed)
Hx of COPD, end stage, O2 dependent, total care of ADLs, continue Morphine 5mg  tid, NuoNeb tid,  Prednisone 20mg  qd,  Lorazepam 1mg  qid

## 2017-08-12 NOTE — Assessment & Plan Note (Signed)
Persists, continue supportive care, comfort measures.

## 2017-08-12 NOTE — Assessment & Plan Note (Signed)
His mood is managed, continue Mirtazapine 15mg  qhs and Lorazepam 1mg  qid.

## 2017-08-12 NOTE — Assessment & Plan Note (Signed)
constipation, managed, continue Senna S I qhs.

## 2017-08-20 DIAGNOSIS — L602 Onychogryphosis: Secondary | ICD-10-CM | POA: Diagnosis not present

## 2017-08-20 DIAGNOSIS — L84 Corns and callosities: Secondary | ICD-10-CM | POA: Diagnosis not present

## 2017-08-20 DIAGNOSIS — M79672 Pain in left foot: Secondary | ICD-10-CM | POA: Diagnosis not present

## 2017-08-20 DIAGNOSIS — M79671 Pain in right foot: Secondary | ICD-10-CM | POA: Diagnosis not present

## 2017-09-17 DEATH — deceased
